# Patient Record
Sex: Male | Born: 1960
Health system: Southern US, Community
[De-identification: ages and names within clinical notes are randomized; demographics above are authoritative.]

## PROBLEM LIST (undated history)

## (undated) DIAGNOSIS — K6389 Other specified diseases of intestine: Secondary | ICD-10-CM

## (undated) DIAGNOSIS — I10 Essential (primary) hypertension: Secondary | ICD-10-CM

## (undated) DIAGNOSIS — D126 Benign neoplasm of colon, unspecified: Secondary | ICD-10-CM

## (undated) DIAGNOSIS — K219 Gastro-esophageal reflux disease without esophagitis: Secondary | ICD-10-CM

## (undated) DIAGNOSIS — L309 Dermatitis, unspecified: Secondary | ICD-10-CM

## (undated) DIAGNOSIS — G4730 Sleep apnea, unspecified: Secondary | ICD-10-CM

## (undated) DIAGNOSIS — C801 Malignant (primary) neoplasm, unspecified: Secondary | ICD-10-CM

## (undated) DIAGNOSIS — E785 Hyperlipidemia, unspecified: Secondary | ICD-10-CM

## (undated) DIAGNOSIS — T7840XA Allergy, unspecified, initial encounter: Secondary | ICD-10-CM

## (undated) HISTORY — PX: COLON SURGERY: SHX602

## (undated) HISTORY — DX: Benign neoplasm of colon, unspecified: D12.6

## (undated) HISTORY — DX: Essential (primary) hypertension: I10

## (undated) HISTORY — DX: Other specified diseases of intestine: K63.89

## (undated) HISTORY — PX: POLYPECTOMY: SHX149

## (undated) HISTORY — DX: Malignant (primary) neoplasm, unspecified: C80.1

## (undated) HISTORY — DX: Hyperlipidemia, unspecified: E78.5

## (undated) HISTORY — DX: Gastro-esophageal reflux disease without esophagitis: K21.9

## (undated) HISTORY — DX: Allergy, unspecified, initial encounter: T78.40XA

---

## 2007-04-25 DIAGNOSIS — J309 Allergic rhinitis, unspecified: Secondary | ICD-10-CM | POA: Insufficient documentation

## 2007-06-10 ENCOUNTER — Emergency Department (HOSPITAL_BASED_OUTPATIENT_CLINIC_OR_DEPARTMENT_OTHER): Admission: EM | Admit: 2007-06-10 | Discharge: 2007-06-10 | Payer: Self-pay | Admitting: Emergency Medicine

## 2009-01-08 ENCOUNTER — Ambulatory Visit (HOSPITAL_BASED_OUTPATIENT_CLINIC_OR_DEPARTMENT_OTHER): Admission: RE | Admit: 2009-01-08 | Discharge: 2009-01-08 | Payer: Self-pay | Admitting: Internal Medicine

## 2009-01-08 ENCOUNTER — Ambulatory Visit: Payer: Self-pay | Admitting: Internal Medicine

## 2009-01-08 ENCOUNTER — Ambulatory Visit: Payer: Self-pay | Admitting: Diagnostic Radiology

## 2009-01-08 DIAGNOSIS — M546 Pain in thoracic spine: Secondary | ICD-10-CM | POA: Insufficient documentation

## 2009-01-08 DIAGNOSIS — K219 Gastro-esophageal reflux disease without esophagitis: Secondary | ICD-10-CM | POA: Insufficient documentation

## 2009-01-08 DIAGNOSIS — J309 Allergic rhinitis, unspecified: Secondary | ICD-10-CM | POA: Insufficient documentation

## 2009-01-15 ENCOUNTER — Ambulatory Visit: Payer: Self-pay | Admitting: Internal Medicine

## 2009-01-15 LAB — CONVERTED CEMR LAB
ALT: 16 units/L (ref 0–53)
AST: 16 units/L (ref 0–37)
Albumin: 4.5 g/dL (ref 3.5–5.2)
Alkaline Phosphatase: 78 units/L (ref 39–117)
BUN: 16 mg/dL (ref 6–23)
Basophils Absolute: 0 10*3/uL (ref 0.0–0.1)
Basophils Relative: 0 % (ref 0–1)
Bilirubin Urine: NEGATIVE
Bilirubin, Direct: 0.1 mg/dL (ref 0.0–0.3)
CO2: 30 meq/L (ref 19–32)
Calcium: 9.4 mg/dL (ref 8.4–10.5)
Chloride: 102 meq/L (ref 96–112)
Cholesterol: 227 mg/dL — ABNORMAL HIGH (ref 0–200)
Creatinine, Ser: 0.94 mg/dL (ref 0.40–1.50)
Eosinophils Absolute: 0.1 10*3/uL (ref 0.0–0.7)
Eosinophils Relative: 1 % (ref 0–5)
Glucose, Bld: 102 mg/dL — ABNORMAL HIGH (ref 70–99)
HCT: 49 % (ref 39.0–52.0)
HDL: 41 mg/dL (ref >39)
Hemoglobin, Urine: NEGATIVE
Hemoglobin: 16.2 g/dL (ref 13.0–17.0)
Indirect Bilirubin: 0.6 mg/dL (ref 0.0–0.9)
Ketones, ur: NEGATIVE mg/dL
LDL Cholesterol: 146 mg/dL — ABNORMAL HIGH (ref 0–99)
Leukocytes, UA: NEGATIVE
Lymphocytes Relative: 27 % (ref 12–46)
Lymphs Abs: 1.5 10*3/uL (ref 0.7–4.0)
MCHC: 33.1 g/dL (ref 30.0–36.0)
MCV: 88 fL (ref 78.0–100.0)
Monocytes Absolute: 0.5 10*3/uL (ref 0.1–1.0)
Monocytes Relative: 9 % (ref 3–12)
Neutro Abs: 3.4 10*3/uL (ref 1.7–7.7)
Neutrophils Relative %: 62 % (ref 43–77)
Nitrite: NEGATIVE
Platelets: 219 10*3/uL (ref 150–400)
Potassium: 4.5 meq/L (ref 3.5–5.3)
Protein, ur: NEGATIVE mg/dL
RBC: 5.57 M/uL (ref 4.22–5.81)
RDW: 13 % (ref 11.5–15.5)
Sodium: 139 meq/L (ref 135–145)
Specific Gravity, Urine: 1.022 (ref 1.005–1.030)
TSH: 2.438 microintl units/mL (ref 0.350–4.500)
Total Bilirubin: 0.7 mg/dL (ref 0.3–1.2)
Total CHOL/HDL Ratio: 5.5
Total Protein: 7 g/dL (ref 6.0–8.3)
Triglycerides: 202 mg/dL — ABNORMAL HIGH (ref <150)
Urine Glucose: NEGATIVE mg/dL
Urobilinogen, UA: 0.2 (ref 0.0–1.0)
VLDL: 40 mg/dL (ref 0–40)
WBC: 5.4 10*3/uL (ref 4.0–10.5)
pH: 6 (ref 5.0–8.0)

## 2009-01-18 ENCOUNTER — Encounter: Payer: Self-pay | Admitting: Internal Medicine

## 2009-01-21 ENCOUNTER — Ambulatory Visit: Payer: Self-pay | Admitting: Internal Medicine

## 2009-06-24 ENCOUNTER — Ambulatory Visit: Payer: Self-pay | Admitting: Internal Medicine

## 2009-06-24 DIAGNOSIS — R0609 Other forms of dyspnea: Secondary | ICD-10-CM | POA: Insufficient documentation

## 2009-06-24 DIAGNOSIS — R0989 Other specified symptoms and signs involving the circulatory and respiratory systems: Secondary | ICD-10-CM

## 2009-07-17 ENCOUNTER — Telehealth: Payer: Self-pay | Admitting: Internal Medicine

## 2009-07-24 ENCOUNTER — Encounter: Payer: Self-pay | Admitting: Internal Medicine

## 2010-01-03 HISTORY — PX: WISDOM TOOTH EXTRACTION: SHX21

## 2010-01-14 ENCOUNTER — Telehealth: Payer: Self-pay | Admitting: Internal Medicine

## 2010-01-19 ENCOUNTER — Encounter: Payer: Self-pay | Admitting: Internal Medicine

## 2010-01-19 LAB — CONVERTED CEMR LAB
BUN: 18 mg/dL (ref 6–23)
CO2: 31 meq/L (ref 19–32)
CRP: 0.1 mg/dL (ref <0.6)
Calcium: 9.2 mg/dL (ref 8.4–10.5)
Chloride: 101 meq/L (ref 96–112)
Cholesterol: 234 mg/dL — ABNORMAL HIGH (ref 0–200)
Creatinine, Ser: 1.02 mg/dL (ref 0.40–1.50)
Glucose, Bld: 103 mg/dL — ABNORMAL HIGH (ref 70–99)
HDL: 39 mg/dL — ABNORMAL LOW (ref >39)
LDL Cholesterol: 174 mg/dL — ABNORMAL HIGH (ref 0–99)
Potassium: 4.5 meq/L (ref 3.5–5.3)
Sodium: 139 meq/L (ref 135–145)
Total CHOL/HDL Ratio: 6
Triglycerides: 106 mg/dL (ref <150)
VLDL: 21 mg/dL (ref 0–40)

## 2010-01-25 ENCOUNTER — Encounter: Payer: Self-pay | Admitting: Internal Medicine

## 2010-01-25 ENCOUNTER — Encounter (INDEPENDENT_AMBULATORY_CARE_PROVIDER_SITE_OTHER): Payer: Self-pay | Admitting: *Deleted

## 2010-01-25 ENCOUNTER — Ambulatory Visit
Admission: RE | Admit: 2010-01-25 | Discharge: 2010-01-25 | Payer: Self-pay | Source: Home / Self Care | Attending: Internal Medicine | Admitting: Internal Medicine

## 2010-01-25 DIAGNOSIS — I10 Essential (primary) hypertension: Secondary | ICD-10-CM | POA: Insufficient documentation

## 2010-01-25 DIAGNOSIS — D239 Other benign neoplasm of skin, unspecified: Secondary | ICD-10-CM | POA: Insufficient documentation

## 2010-01-25 DIAGNOSIS — E785 Hyperlipidemia, unspecified: Secondary | ICD-10-CM | POA: Insufficient documentation

## 2010-01-25 DIAGNOSIS — E7800 Pure hypercholesterolemia, unspecified: Secondary | ICD-10-CM | POA: Insufficient documentation

## 2010-02-02 NOTE — Assessment & Plan Note (Signed)
Summary: cpx/mhf   Vital Signs:  Patient profile:   50 year old male Weight:      198.75 pounds BMI:     29.46 O2 Sat:      98 % on Room air Temp:     98.6 degrees F oral Pulse rate:   70 / minute Pulse rhythm:   regular Resp:     16 per minute BP sitting:   110 / 80  (right arm) Cuff size:   large  Vitals Entered By: Glendell Docker CMA (January 21, 2009 8:44 AM)  O2 Flow:  Room air  Primary Care Provider:  D. Thomos Lemons DO  CC:  CPX.  History of Present Illness: CPX  50 y/o white male for CPX.   right shoulder blade discomfort improved.  symptoms were triggered by movement  lab results reviewed.  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    Physical Exam  General:  alert, well-developed, and well-nourished.   Head:  normocephalic and atraumatic.   Lungs:  normal respiratory effort, normal breath sounds, no crackles, and no wheezes.   Heart:  normal rate, regular rhythm, no murmur, and no gallop.   Abdomen:  soft, non-tender, normal bowel sounds, no masses, no hepatomegaly, and no splenomegaly.   Extremities:  No lower extremity edema  Neurologic:  cranial nerves II-XII intact and gait normal.   Psych:  normally interactive, good eye contact, not anxious appearing, and not depressed appearing.      Impression & Recommendations:  Problem # 1:  HEALTH MAINTENANCE EXAM (ICD-V70.0) Reviewed adult health maintenance protocols.  routine colon ca screening at age 50.  we discussed pros and cons of prostate ca screening.  low saturated fat diet reviewed.  Orders: EKG w/ Interpretation (93000)  Flu Vax: Historical (11/18/2008)   Chol: 227 (01/15/2009)   HDL: 41 (01/15/2009)   LDL: 146 (01/15/2009)   TG: 202 (01/15/2009) TSH: 2.438 (01/15/2009)     Complete Medication List: 1)  Fluticasone Propionate 0.005 % Oint (Fluticasone propionate) .... 2 spray each nostril once daily 2)  Betamethasone Valerate 0.1 % Oint (Betamethasone valerate) .... Apply to affected area two times a day  Patient Instructions: 1)  Please schedule a follow-up appointment in 6 months. 2)  Lipid Panel prior to visit, ICD-9: 272.4 3)  High sensitivity CRP :  272.4 4)  Please return for lab work one (1) week before your next appointment.  Prescriptions: BETAMETHASONE VALERATE 0.1 % OINT (BETAMETHASONE VALERATE) apply to affected area two times a day  #30 grams x 2   Entered and Authorized by:   D. Thomos Lemons DO   Signed by:   D. Thomos Lemons DO on 01/21/2009   Method used:   [facility] [phone]* (retail)       [address]  [place], [place]  [address]       Ph: [phone]       Fax: [phone]   RxID:   [id number]   Current Allergies (reviewed today): No known allergies

## 2010-02-02 NOTE — Assessment & Plan Note (Signed)
Summary: 6 month follow up/mhf   Vital Signs:  Patient profile:   50 year old male Weight:      194.50 pounds BMI:     28.83 O2 Sat:      97 % on Room air Temp:     98.2 degrees F oral Pulse rate:   68 / minute Pulse rhythm:   regular Resp:     18 per minute BP sitting:   110 / 70  (right arm) Cuff size:   large  Vitals Entered By: Glendell Docker CMA (June 24, 2009 8:02 AM)  O2 Flow:  Room air CC: Rm 2- 6 Month Follow up, Abdominal Pain Is Patient Diabetic? No Pain Assessment Patient in pain? no      Comments discuss referrl to urology for Vasectomy, rx for allergy medication,otc claritan takes the edge off, sleep apnea & snoring, medications reviewed, no changes   Primary Care Provider:  DThomos Lemons DO  CC:  Rm 2- 6 Month Follow up and Abdominal Pain.  History of Present Illness:       This is a 50 year old male who presents with allergic rhinitis.  The patient complains of nasal congestion and post nasal drip.  Symptoms improve with antihistamines and steroid nasal spray.  Previous effective treatment includes fexofenadine.  Ineffective treatment includes loratadine.    wife is getting off birth control he requests referral for vasectomy  experienced exacerbation of back pain but co worker "cracked" his back .  symptoms completely resolved  wife notes snoring.  she is concerned he may have OSA mild fatigue, mild decrease in sleep latency some wt gain since prev visit.  he felt better when he got down to 180's joined [facility]  Dyspepsia History:      There is a prior history of GERD.    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   Ears:  R ear normal and L ear normal.   Nose:  nasal dischargemucosal pallor and mucosal edema.   Mouth:  pharynx pink and moist.  no oropharyngeal crowding Neck:  normal size Lungs:  normal respiratory effort, normal breath sounds, and no wheezes.   Heart:  normal rate, regular rhythm, and no gallop.     Impression & Recommendations:  Problem # 1:  ALLERGIC RHINITIS (ICD-477.9) he reports sensitivity to grass pollen.  claritin minimally helpful.  use fexofendaine and flonase as directed.   His updated medication list for this problem includes:    Fluticasone Propionate 50 Mcg/act Susp (Fluticasone propionate) .[name]... 2 sprays each nostril qd    Fexofenadine Hcl 180 Mg Tabs (Fexofenadine hcl) ..... One by mouth once daily  Problem # 2:  CONTRACEPTIVE MANAGEMENT (ICD-V25.09) refer for vasectomy  Dr. Normajean Baxter Orders: Urology Referral (Urology)  Problem # 3:  SNORING (ICD-786.09) wife notes snoring.  some fatigue but net severe.  call dentist for trial of oral appliance.  work on wt loss  if symptoms worsen, consider sleep study  Complete Medication List: 1)  Fluticasone Propionate 50 Mcg/act Susp (Fluticasone propionate) .... 2 sprays each nostril qd 2)  Betamethasone Valerate 0.1 % Oint (Betamethasone valerate) .... Apply to affected area two times a day 3)  Fexofenadine Hcl 180 Mg Tabs (Fexofenadine hcl) .... One by mouth once daily   Patient Instructions: 1)  Please schedule a follow-up appointment in 1 year. 2)  Please schedule a follow-up appointment as needed. Prescriptions: FLUTICASONE PROPIONATE 50 MCG/ACT SUSP (FLUTICASONE PROPIONATE) 2 sprays each nostril qd  #1 x 5   Entered and Authorized by:   D. Thomos Lemons DO   Signed by:   D. Thomos Lemons  DO on 06/24/2009   Method used:   [facility] to        [facility] [phone]* (retail)       [address]       [place], [place]  [address]       Ph: [phone]       Fax: [phone]   RxID:   [id number] FEXOFENADINE HCL 180 MG TABS (FEXOFENADINE HCL) one by mouth once daily  #90 x 3   Entered and Authorized by:   D. Thomos Lemons DO   Signed by:   D. Thomos Lemons DO on 06/24/2009   Method used:   [facility]ronically to        [facility] [phone]* (retail)       [address]       [place], [place]  [address]       Ph: [phone]       Fax: [phone]   RxID:   [id number]   Current Allergies (reviewed today): No known allergies

## 2010-02-02 NOTE — Letter (Signed)
   [place] at [facility] [address] [place], [place]  [address]  [place] Phone: [phone]      January 19, 2009   CAMPBELL KRAY [address] [place], [place] [address]  RE:  LAB RESULTS  Dear  Richard Henderson,  The following is an interpretation of your most recent lab tests.  Please take note of any instructions provided or changes to medications that have resulted from your lab work.  ELECTROLYTES:  Good - no changes needed  KIDNEY FUNCTION TESTS:  Good - no changes needed  LIVER FUNCTION TESTS:  Good - no changes needed  LIPID PANEL:  Fair - review at your next visit Triglyceride: 202   Cholesterol: 227   LDL: 146   HDL: 41   Chol/HDL%:  5.5 Ratio  THYROID STUDIES:  Thyroid studies normal TSH: 2.438     CBC:  Good - no changes needed  Please lower your intake of saturated fats and sweets.  I will further discuss your lab results at your next follow up appointment.        Sincerely Yours,    Dr. Thomos Lemons

## 2010-02-02 NOTE — Consult Note (Signed)
Summary: [facility]  [facility]   Imported By: Maryln Gottron 08/11/2009 13:09:56  _____________________________________________________________________  External Attachment:    Type:   Image     Comment:   External Document

## 2010-02-02 NOTE — Progress Notes (Signed)
Summary: [employer] prior auth  Phone Note Call from Patient Call back at [phone]   Caller: Patient Reason for Call: Referral Summary of Call: Pt requested that we check to see if he needs prior auth with [facility] for his urology referral  Initial call taken by: Lannette Donath,  July 17, 2009 1:12 PM  Follow-up for Phone Call        SW [facility] for procedure code [id number], gave procedure code to [facility], no prior auth needed, pt aware Follow-up by: Lannette Donath,  July 17, 2009 1:38 PM

## 2010-02-02 NOTE — Assessment & Plan Note (Signed)
Summary: new to be est.- jr   Vital Signs:  Patient profile:   50 year old male Height:      69 inches Weight:      197.75 pounds BMI:     29.31 O2 Sat:      99 % on Room air Temp:     98.3 degrees F oral Pulse rate:   66 / minute Pulse rhythm:   regular Resp:     18 per minute BP sitting:   134 / 90  (left arm) Cuff size:   large  Vitals Entered By: Glendell Docker CMA (January 08, 2009 9:17 AM)  O2 Flow:  Room air  Primary Care Provider:  D. Thomos Lemons DO  CC:  New Patient.  History of Present Illness: New Patient   50 y/o male to establish.  He c/o pain for past 3-4 weeks under left shoulder blade.  pain radiates to left  shoulder and  to pectoris  muslce.  his symptoms  aggravated by coughing and sneezing.  pain also worse with certain movements.  no shortness of breath.  no chest pressure.   Preventive Screening-Counseling & Management  Alcohol-Tobacco     Alcohol drinks/day: 0     Alcohol Counseling: not indicated; patient does not drink     Smoking Status: never     Tobacco Counseling: not indicated; no tobacco use  Caffeine-Diet-Exercise     Caffeine use/day: 2 beverage daily     Caffeine Counseling: decrease use of caffeine     Does Patient Exercise: no  Allergies (verified): No Known Drug Allergies  Past History:  Past Medical History: Allergic rhinitis GERD    Past Surgical History: Denies surgical history  Family History: Family History of Arthritis Family History of Prostate CA 1st degree relative  Alzheimers - father died age 65  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    Physical Exam  General:   alert, well-developed, and well-nourished.   Head:  normocephalic and atraumatic.   Eyes:  pupils equal, pupils round, and pupils reactive to light.   Ears:  R ear normal and L ear normal.   Mouth:  Oral mucosa and oropharynx without lesions or exudates.   Neck:  supple and no masses.   Lungs:  normal respiratory effort, normal breath sounds, no crackles, and no wheezes.   Heart:  normal rate, regular rhythm, no murmur, and no gallop.   Abdomen:  soft, non-tender, normal bowel sounds, no masses, no hepatomegaly, and no splenomegaly.   Msk:  pain underneath left shoulder blader with thoracic rotation and sidebending Neurologic:  cranial nerves II-XII intact and gait normal.     Impression & Recommendations:  Problem # 1:  BACK PAIN, THORACIC REGION, LEFT (ICD-724.1) 50 y/o with pain underneath left shoulder blade.  worse with movement.  cxr negative for ptx.  likely MSK etiology.   refer to chiropractor.  Patient advised to call office if symptoms persist or worsen.  Orders: T-2 View CXR, Same Day (71020.5TC)  Patient Instructions: 1)  Please schedule a follow-up appointment in 2 months. 2)  BMP prior to visit, ICD-9: v70 3)  Hepatic Panel prior to visit, ICD-9: v70 4)  Lipid Panel prior to visit, ICD-9: v70 5)  TSH prior to visit, ICD-9: v70 6)  CBC w/ Diff prior to visit, ICD-9: v70 7)  Please return for lab work within 1 week (fasting) 8)  Use ibuprofen 400-600 mg two times a day x 3-5 days as needed 9)  [facility] 10)  [address] 11)  [place], [place] [address] 12)  Phone: [phone])  Fax: [phone] 14)  Email: [email or web address]  Current Allergies (reviewed today): No known allergies     Immunization History:  Influenza Immunization History:    Influenza:  historical (11/18/2008)

## 2010-02-04 NOTE — Letter (Signed)
Summary: Primary Care Consult Scheduled Letter  [place] at [facility]  [address]   [place], [place] [address]   Phone: [phone]  Fax: [phone]      01/25/2010 MRN: [id number]  Richard Henderson [address] [place], [place]  [address]    Dear [name],      We have scheduled an appointment for you.  At the recommendation of Dr._YOO, we have scheduled you a consult with [facility] on _FEBRUARY  14,2012 at 11:30AM.  Their address is_[address] , [place] . The office phone number is [phone].  If this appointment day and time is not convenient for you, please feel free to call the office of the doctor you are being referred to at the number listed above and reschedule the appointment.     It is important for you to keep your scheduled appointments. We are here to make sure you are given good patient care.    Thank you, Darral Dash Patient Care Coordinator [place] at [facility]

## 2010-02-04 NOTE — Progress Notes (Signed)
Summary: Lab order  Phone Note Call from Patient   Caller: Patient Details for Reason: Lab order Summary of Call: Pt scheduled for CPX   January 20  will come in Monday Jan 16 for fasting labs  please order labs Initial call taken by: Darral Dash,  January 14, 2010 2:57 PM  Follow-up for Phone Call        BMP prior to visit, ICD-9:  272.4 Lipid Panel prior to visit, ICD-9:  272.4 High sensitivity CRP :  272.4   Follow-up by: D. Thomos Lemons DO,  January 15, 2010 12:55 PM  Additional Follow-up for Phone Call Additional follow up Details #1::        fasting labs have been entered for [facility]. Additional Follow-up by: Glendell Docker CMA,  January 15, 2010 1:25 PM

## 2010-02-10 ENCOUNTER — Encounter: Payer: Self-pay | Admitting: Internal Medicine

## 2010-02-10 NOTE — Assessment & Plan Note (Signed)
Summary: CPX [place]   Vital Signs:  Patient profile:   50 year old male Height:      69 inches Weight:      198.50 pounds BMI:     29.42 O2 Sat:      100 % on Room air Temp:     98.0 degrees F oral Pulse rate:   56 / minute Resp:     16 per minute BP sitting:   150 / 80  (left arm) Cuff size:   large  Vitals Entered By: Glendell Docker CMA (January 25, 2010 2:37 PM)  O2 Flow:  Room air 8CC: CPX Is Patient Diabetic? No Pain Assessment Patient in pain? no        Primary Care Provider:  Dondra Spry DO  CC:  CPX.  History of Present Illness: 01/14/2010 - went to Dentist for pre visit - BP found to be elevated 172/98, 173/100,  164/92  took cold medication /preps over the holidays 2 cups of day of coffee denies use of "energy drinks"  pt is usually a jogger.  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    Physical Exam  General:  alert, well-developed, and well-nourished.   Head:  normocephalic and atraumatic.   Eyes:  pupils equal, pupils  round, and pupils reactive to light.   Ears:  R ear normal and L ear normal.   Mouth:  pharyngeal crowding.   Neck:  supple, no masses, and no carotid bruits.   Lungs:  normal respiratory effort and normal breath sounds.   Heart:  normal rate, regular rhythm, no murmur, and no gallop.   Abdomen:  soft, non-tender, normal bowel sounds, no masses, no hepatomegaly, and no splenomegaly.   Msk:  multiple nevi Extremities:  No lower extremity edema  Neurologic:  cranial nerves II-XII intact and gait normal.   Psych:  normally interactive, good eye contact, not anxious appearing, and not depressed appearing.     Impression & Recommendations:  Problem # 1:  HEALTH MAINTENANCE EXAM (ICD-V70.0) Reviewed adult health maintenance protocols.  Orders: EKG w/ Interpretation (93000)  Flu Vax: Historical (11/18/2008)   Chol: 234 (01/19/2010)   HDL: 39 (01/19/2010)   LDL: 174 (01/19/2010)   TG: 106 (01/19/2010) TSH: 2.438 (01/15/2009)     Problem # 2:  HYPERTENSION (ICD-401.9) Assessment: New start ARB.  reviewed lifestyle changes  His updated medication list for this problem includes:    Losartan Potassium 50 Mg Tabs (Losartan potassium) ..... One by mouth once daily  BP today: 150/80 Prior BP: 110/70 (06/24/2009)  Labs Reviewed: K+: 4.5 (01/19/2010) Creat: : 1.02 (01/19/2010)   Chol: 234 (01/19/2010)   HDL: 39 (01/19/2010)   LDL: 174 (01/19/2010)   TG: 106 (01/19/2010)  Problem # 3:  HYPERLIPIDEMIA (ICD-272.4) no improvement with dietary changes.  start lipitor  His updated medication list for this problem includes:    Atorvastatin Calcium 10 Mg Tabs (Atorvastatin calcium) ..... One by mouth once daily  Labs Reviewed: SGOT: 16 (01/15/2009)   SGPT: 16 (01/15/2009)   HDL:39 (01/19/2010), 41 (01/15/2009)  LDL:174 (01/19/2010), 146 (01/15/2009)  Chol:234 (01/19/2010), 227 (01/15/2009)  Trig:106 (01/19/2010), 202 (01/15/2009)  Problem # 4:  NEVI, MULTIPLE  (ICD-216.9)  Orders: Dermatology Referral (Derma)  Problem # 5:  SNORING (ICD-786.09)  Orders: Misc. Referral (Misc. Ref)  Complete Medication List: 1)  Fluticasone Propionate 50 Mcg/act Susp (Fluticasone propionate) .... 2 sprays each nostril qd 2)  Betamethasone Valerate 0.1 % Oint (Betamethasone valerate) .... Apply to affected area two times a day 3)  Fexofenadine Hcl 180 Mg Tabs (Fexofenadine hcl) .... One by mouth once daily 4)  Losartan Potassium 50 Mg Tabs (Losartan potassium) .... One by mouth once daily 5)  Atorvastatin Calcium 10 Mg Tabs (Atorvastatin calcium) .... One by mouth once daily  Patient Instructions: 1)  Please schedule a follow-up appointment in 1 month. 2)  BMP prior to visit, ICD-9: 401.9 3)  Hepatic Panel prior to visit, ICD-9: 272.4 4)  Lipid Panel prior to visit, ICD-9: 272.4 5)  Please return for lab work one (1) week before your next appointment.  6)  [email or web address] 7)  Please see attached handout on low cholesterol diet 8)  Our office will contact you re:  dermatology referral Prescriptions: ATORVASTATIN CALCIUM 10 MG TABS (ATORVASTATIN CALCIUM) one by mouth once daily  #30 x 3   Entered and Authorized by:   D. Thomos Lemons DO   Signed by:   D. Thomos Lemons DO on 01/25/2010   Method used:   [facility]        [facility] [phone]* (retail)       [address]       [place], [place]  [address]       Ph: [phone]       Fax: [phone]   RxID:   [id number] LOSARTAN POTASSIUM 50 MG TABS (LOSARTAN POTASSIUM) one by mouth once daily  #30 x 3   Entered and Authorized by:   D. Thomos Lemons DO   Signed by:   D. Thomos Lemons DO on 01/25/2010   Method used:   [facility] to        [facility] [phone]* (retail)       [address]       [place], [place]  [address]       Ph: [phone]       Fax: [phone]   RxID:   [id number]    Orders Added: 1)  EKG w/ Interpretation  [93000] 2)  Dermatology Referral [Derma] 3)  Misc. Referral [Misc. Ref] 4)  Est. Patient 40-64 years [99396] 5)  Est. Patient Level III [35573]     Current Allergies (reviewed today): No known allergies    Appended Document: Orders Update    Clinical Lists Changes  Orders: Added new Referral order of Misc. Referral (Misc. Ref) - Signed

## 2010-02-16 ENCOUNTER — Telehealth: Payer: Self-pay | Admitting: Internal Medicine

## 2010-02-16 ENCOUNTER — Encounter: Payer: Self-pay | Admitting: Internal Medicine

## 2010-02-16 DIAGNOSIS — G4733 Obstructive sleep apnea (adult) (pediatric): Secondary | ICD-10-CM | POA: Insufficient documentation

## 2010-02-18 ENCOUNTER — Encounter (INDEPENDENT_AMBULATORY_CARE_PROVIDER_SITE_OTHER): Payer: Self-pay | Admitting: *Deleted

## 2010-02-24 NOTE — Letter (Signed)
Summary: Primary Care Consult Scheduled Letter  [place] at [facility]  [address]   [place], [place] [address]   Phone: [phone]  Fax: [phone]      02/18/2010 MRN: [id number]  Richard Henderson [address] [place], [place]  [address]    Dear Richard Henderson,      We have scheduled an appointment for you.  At the recommendation of Dr._YOO, we have scheduled you a consult with DR Lanny Cramp [facility] [place] on [facility] 13,2012 at 2:30PM.  Their address is_[address], [address] ,[place]  [address]. The office phone number is [phone].  If this appointment day and time is not convenient for you, please feel free to call the office of the doctor you are being referred to at the number listed above and reschedule the appointment.     It is important for you to keep your scheduled appointments. We are here to make sure you are given good patient care.    Thank you,  Darral Dash Patient Care Coordinator  at [facility]

## 2010-02-24 NOTE — Progress Notes (Signed)
Summary: Sleep Study  Phone Note Outgoing Call   Summary of Call: call pt - sleep screening suggestive of obstructive sleep apnea.   I suggest referral to Dr. Vassie Loll [facility] - 35.28 Initial call taken by: D. Thomos Lemons DO,  February 16, 2010 4:56 PM  Follow-up for Phone Call        call placed to patient at [phone], no answer. A detailed voice message was left for patient to return phone call regard sleep study. Follow-up by: Glendell Docker CMA,  February 17, 2010 8:53 AM  Additional Follow-up for Phone Call Additional follow up Details #1::        Pt has been advised and will await call from [facility] or Marj with appt date/time. Additional Follow-up by: Mervin Kung CMA Duncan Dull),  February 17, 2010 4:12 PM  New Problems: SLEEP APNEA, OBSTRUCTIVE (ICD-327.23)   New Problems: SLEEP APNEA, OBSTRUCTIVE (ICD-327.23)

## 2010-02-26 ENCOUNTER — Encounter: Payer: Self-pay | Admitting: Internal Medicine

## 2010-02-26 LAB — CONVERTED CEMR LAB
ALT: 16 units/L (ref 0–53)
AST: 13 units/L (ref 0–37)
Albumin: 4.4 g/dL (ref 3.5–5.2)
Alkaline Phosphatase: 88 units/L (ref 39–117)
BUN: 15 mg/dL (ref 6–23)
Bilirubin, Direct: 0.1 mg/dL (ref 0.0–0.3)
CO2: 29 meq/L (ref 19–32)
Calcium: 9.4 mg/dL (ref 8.4–10.5)
Chloride: 103 meq/L (ref 96–112)
Cholesterol: 173 mg/dL (ref 0–200)
Creatinine, Ser: 1.03 mg/dL (ref 0.40–1.50)
Glucose, Bld: 107 mg/dL — ABNORMAL HIGH (ref 70–99)
HDL: 37 mg/dL — ABNORMAL LOW (ref >39)
Indirect Bilirubin: 0.7 mg/dL (ref 0.0–0.9)
LDL Cholesterol: 102 mg/dL — ABNORMAL HIGH (ref 0–99)
Potassium: 4.5 meq/L (ref 3.5–5.3)
Sodium: 141 meq/L (ref 135–145)
Total Bilirubin: 0.8 mg/dL (ref 0.3–1.2)
Total CHOL/HDL Ratio: 4.7
Total Protein: 6.9 g/dL (ref 6.0–8.3)
Triglycerides: 170 mg/dL — ABNORMAL HIGH (ref <150)
VLDL: 34 mg/dL (ref 0–40)

## 2010-03-01 ENCOUNTER — Ambulatory Visit (INDEPENDENT_AMBULATORY_CARE_PROVIDER_SITE_OTHER): Payer: 59 | Admitting: Internal Medicine

## 2010-03-01 ENCOUNTER — Encounter: Payer: Self-pay | Admitting: Internal Medicine

## 2010-03-01 DIAGNOSIS — I10 Essential (primary) hypertension: Secondary | ICD-10-CM

## 2010-03-01 DIAGNOSIS — E785 Hyperlipidemia, unspecified: Secondary | ICD-10-CM

## 2010-03-02 NOTE — Miscellaneous (Signed)
Summary: lab orders  Clinical Lists Changes  Orders: Added new Test order of T-Basic Metabolic Panel [id number]) - Signed Added new Test order of T-Hepatic Function [id number]) - Signed Added new Test order of T-Lipid Profile [id number]) - Signed

## 2010-03-02 NOTE — Consult Note (Signed)
Summary: [facility]  [facility]   Imported By: Maryln Gottron 02/25/2010 15:18:29  _____________________________________________________________________  External Attachment:    Type:   Image     Comment:   External Document

## 2010-03-08 ENCOUNTER — Telehealth: Payer: Self-pay | Admitting: Internal Medicine

## 2010-03-09 ENCOUNTER — Encounter: Payer: Self-pay | Admitting: Internal Medicine

## 2010-03-16 ENCOUNTER — Institutional Professional Consult (permissible substitution) (INDEPENDENT_AMBULATORY_CARE_PROVIDER_SITE_OTHER): Payer: 59 | Admitting: Pulmonary Disease

## 2010-03-16 ENCOUNTER — Encounter: Payer: Self-pay | Admitting: Pulmonary Disease

## 2010-03-16 DIAGNOSIS — G4733 Obstructive sleep apnea (adult) (pediatric): Secondary | ICD-10-CM

## 2010-03-16 NOTE — Progress Notes (Signed)
Summary: dentist requests note from dr.Lakhia Gengler  Phone Note Call from Patient Call back at [phone]   Summary of Call: pt called stating that dr. Warrick Parisian) needs a note from dr. Artist Pais stating that pt is on blood pressure pills and that its okay to have his wisdom teeth pulled. please assist. dr. Irving Burton fax [phone]. please assist. Initial call taken by: Elba Barman,  March 08, 2010 10:40 AM  Follow-up for Phone Call        call placed to patient for clarification of message at [phone], He is  stated that he is having 4 wisdom teeth removed on March 15th. His blood pressure was evelated at the dental office prior to seeing Dr Artist Pais, and he was advised by the dentist to obtain a letter stating his blood pressure has been followed up on before he is able to proceed with surgery Follow-up by: Glendell Docker CMA,  March 08, 2010 12:24 PM  Additional Follow-up for Phone Call Additional follow up Details #1::        ok to send letter Additional Follow-up by: D. Thomos Lemons DO,  March 08, 2010 5:46 PM    Additional Follow-up for Phone Call Additional follow up Details #2::    letter printed on 03/09/2010 and faxed to Dr Irving Burton office at [phone] Follow-up by: Glendell Docker CMA,  March 09, 2010 8:47 AM

## 2010-03-16 NOTE — Letter (Signed)
Summary: Generic Letter  [place] at [facility]  [address]   [place], [place] [address]   Phone: [phone]  Fax: [phone]      03/09/2010     [name] [address] [place], [place]  [address]  [place]       To whom it may concern: Richard Henderson is under the care of Dr Thomos Lemons for his blood pressure management. He has been prescribed Losartan  Potassium 50mg  once daily, and is due for follow up with Dr Artist Pais in 6 months.           Sincerely,   Glendell Docker CMA Dr. Thomos Lemons

## 2010-03-23 NOTE — Assessment & Plan Note (Signed)
Summary: 1 MONTH FOLLOW UP/MHF   Vital Signs:  Patient profile:   50 year old male Height:      69 inches Weight:      190.25 pounds BMI:     28.20 O2 Sat:      99 % on Room air Temp:     98.2 degrees F rectal Pulse rate:   58 / minute Resp:     18 per minute BP sitting:   124 / 80  (right arm) Cuff size:   large  Vitals Entered By: Glendell Docker CMA (March 01, 2010 2:24 PM)  O2 Flow:  Room air CC: 1 Month Follow up Is Patient Diabetic? No Pain Assessment Patient in pain? no       Does patient need assistance? Functional Status Self care Comments no concerns, follow up blood work, appt for sleep study on 3/13   Primary Care Provider:  D. Thomos Lemons DO  CC:  1 Month Follow up.  History of Present Illness: 50 y/o white male for follow up BP better since starting losartan he denies side effects  hyperlipidemia - tolerating medication.  denies myalgia.  good dietary compliance  reviewed labs - he has slightly elevated blood sugars  enjoys sweets no soft drinks usually coffee, milk, water  Preventive Screening-Counseling & Management  Alcohol-Tobacco     Smoking Status: never  Allergies (verified): No Known Drug Allergies  Past History:  Past Medical History: Allergic rhinitis GERD        Past Surgical History: Denies surgical history       Family History: Family History of Arthritis Family History of Prostate CA - father  Alzheimers - father died age 56      no colon ca   Social History: Occupation: Airline pilot  Married 21 years (wife Liborio Nixon) 3 daughters 94, 47, 43 Alcohol use- rare  Physical Exam  General:  alert, well-developed, and well-nourished.   Lungs:  normal respiratory effort and normal breath sounds.   Heart:  normal rate, regular rhythm, no murmur, and no gallop.   Neurologic:  cranial nerves II-XII intact and gait normal.     Impression & Recommendations:  Problem # 1:  HYPERLIPIDEMIA  (ICD-272.4) Assessment Improved  His updated medication list for this problem includes:    Atorvastatin Calcium 10 Mg Tabs (Atorvastatin calcium) ..... One by mouth once daily  Labs Reviewed: SGOT: 16 (01/15/2009)   SGPT: 16 (01/15/2009)   HDL:39 (01/19/2010), 41 (01/15/2009)  LDL:174 (01/19/2010), 146 (01/15/2009)  Chol:234 (01/19/2010), 227 (01/15/2009)  Trig:106 (01/19/2010), 202 (01/15/2009)  Problem # 2:  HYPERTENSION (ICD-401.9) Assessment: Improved  His updated medication list for this problem includes:    Losartan Potassium 50 Mg Tabs (Losartan potassium) ..... One by mouth once daily  BP today: 124/80 Prior BP: 150/80 (01/25/2010)  Labs Reviewed: K+: 4.5 (01/19/2010) Creat: : 1.02 (01/19/2010)   Chol: 234 (01/19/2010)   HDL: 39 (01/19/2010)   LDL: 174 (01/19/2010)   TG: 106 (01/19/2010)  Future Orders: T-Basic Metabolic Panel [id number]) ... 08/09/2010  Complete Medication List: 1)  Fluticasone Propionate 50 Mcg/act Susp (Fluticasone propionate) .... 2 sprays each nostril qd 2)  Betamethasone Valerate 0.1 % Oint (Betamethasone valerate) .... Apply to affected area two times a day 3)  Losartan Potassium 50 Mg Tabs (Losartan potassium) .... One by mouth once daily 4)  Atorvastatin Calcium 10 Mg Tabs (Atorvastatin calcium) .... One by mouth once daily 5)  Zyrtec Hives Relief 10 Mg Tabs (Cetirizine hcl) .... Take  1 tablet by mouth once a day  Other Orders: Future Orders: T- Hemoglobin A1C ([id number]) ... 08/09/2010  Patient Instructions: 1)  Avoid products containing refined sugars 2)  Please schedule a follow-up appointment in 6 months. 3)  BMP prior to visit, ICD-9:  401.9 4)  HbgA1C prior to visit, ICD-9:  790.29 5)  Please return for lab work one (1) week before your next appointment.  Prescriptions: ATORVASTATIN CALCIUM 10 MG TABS (ATORVASTATIN CALCIUM) one by mouth once daily  #90 x 1   Entered and Authorized by:   D. Thomos Lemons DO   Signed by:   D. Thomos Lemons DO on 03/01/2010   Method used:   Electronically to        [facility] [phone][facility] (retail)       [address]       [place], [place]  [address]       Ph: [phone]       Fax: [phone]   RxID:   [id number] LOSARTAN POTASSIUM 50 MG TABS (LOSARTAN POTASSIUM) one by mouth once daily  #90 x 1   Entered and Authorized by:   D. Thomos Lemons DO   Signed by:   D. Thomos Lemons DO on 03/01/2010   Method used:   [facility]ronically to        [facility] [phone]* (retail)       [address]       [place], [place]  [address]       Ph: [phone]       Fax: [phone]   RxID:   [id number] FLUTICASONE PROPIONATE 50 MCG/ACT SUSP (FLUTICASONE PROPIONATE) 2 sprays each nostril qd  #1 x 5   Entered and Authorized by:   D. Thomos Lemons DO   Signed by:   D. Thomos Lemons DO on 03/01/2010   Method used:   [facility] to        [facility] [phone]* (retail)       [address]       [place], [place]  [address]       Ph: [phone]       Fax: [phone]   RxID:   [id number]    Orders Added: 1)  T-Basic Metabolic Panel [id number] 2)  T- Hemoglobin A1C [[id number]] 3)  Est. Patient Level III [[address]]    Current Allergies (reviewed today): No known allergies

## 2010-03-23 NOTE — Assessment & Plan Note (Signed)
Summary: CONSULT FOR SLEEP//SH IN [place] OFFICE   Visit Type:  Initial Consult Primary Provider/Referring Provider:  Dondra Spry DO  CC:  Pt c/o snoring.  History of Present Illness: 50/M for evaluation of obstructive sleep apnea  Portable study 'R U asleep'  - 38/h s/o severe obstructive sleep apnea  Wife reports snoring worse on his back Epworth Sleepiness Score 15 Will fall asleep watching Tv on the couch until 1-2 A, afternoons, as a passenger diet coke x 1, coffee am daily Bedtime 11 p, oob 6 A, wakes up tired, non refreshing sleep There is no history suggestive of cataplexy, sleep paralysis or parasomnias   Preventive Screening-Counseling & Management  Alcohol-Tobacco     Alcohol drinks/day: 0     Smoking Status: never   History of Present Illness: Snoring  What time do you typically go to bed?(between what hours): 11pm  How long does it take you to fall asleep? not long  How many times during the night do you wake up? 0  What time do you get out of bed to start your day? 6am  Do you drive or operate heavy machinery in your occupation? no  How much has your weight changed (up or down) over the past two years? (in pounds): 4 or 5 lbs increase  Have you ever had a sleep study before?  If yes,when and where: No  Do you currently use CPAP ? If so , at what pressure? no  Do you wear oxygen at any time? If yes, how many liters per minute? no Current Medications (verified): 1)  Fluticasone Propionate 50 Mcg/act Susp (Fluticasone Propionate) .... 2 Sprays Each Nostril Qd 2)  Betamethasone Valerate 0.1 % Oint (Betamethasone Valerate) .... Apply To Affected Area Two Times A Day 3)  Losartan Potassium 50 Mg Tabs (Losartan Potassium) .... One By Mouth Once Daily 4)  Atorvastatin Calcium 10 Mg Tabs (Atorvastatin Calcium) .... One By Mouth Once Daily 5)  Zyrtec Hives Relief 10 Mg Tabs (Cetirizine Hcl) .... Take 1 Tablet By Mouth Once A Day  Allergies (verified): No  Known Drug Allergies  Past History:  Past Medical History: Last updated: 03/01/2010 Allergic rhinitis GERD        Past Surgical History: Last updated: 03/01/2010 Denies surgical history       Family History: Last updated: 03/01/2010 Family History of Arthritis Family History of Prostate CA - father  Alzheimers - father died age 50      no colon ca   Social History: Last updated: 03/01/2010 Occupation: Airline pilot  Married 21 years (wife Liborio Nixon) 3 daughters 81, 59, 67 Alcohol use- rare  Review of Systems       The patient complains of non-productive cough, irregular heartbeats, acid heartburn, nasal congestion/difficulty breathing through nose, sneezing, itching, ear ache, and joint stiffness or pain.  The patient denies shortness of breath with activity, shortness of breath at rest, productive cough, coughing up blood, chest pain, indigestion, loss of appetite, weight change, abdominal pain, difficulty swallowing, sore throat, tooth/dental problems, headaches, anxiety, depression, hand/feet swelling, rash, change in color of mucus, and fever.    Vital Signs:  Patient profile:   50 year old male Height:      69 inches Weight:      201 pounds BMI:     29.79 O2 Sat:      97 % on Room air Temp:     98.5 degrees F oral Pulse rate:   77 / minute  BP sitting:   104 / 60  (right arm) Cuff size:   large  Vitals Entered By: Zackery Barefoot CMA (March 16, 2010 2:43 PM)  O2 Flow:  Room air CC: Pt c/o snoring Comments Medications reviewed with patient Verified contact number and pharmacy with patient Zackery Barefoot CMA  March 16, 2010 2:43 PM    Physical Exam  Additional Exam:  Gen. Pleasant, well-nourished, in no distress, normal affect ENT - no lesions, no post nasal drip, class 2 airway, long uvula, deviated septum Neck: No JVD, no thyromegaly, no carotid bruits Lungs: no use of accessory muscles, no dullness to percussion, clear without rales or  rhonchi  Cardiovascular: Rhythm regular, heart sounds  normal, no murmurs or gallops, no peripheral edema Abdomen: soft and non-tender, no hepatosplenomegaly, BS normal. Musculoskeletal: No deformities, no cyanosis or clubbing Neuro:  alert, non focal     Impression & Recommendations:  Problem # 1:  SLEEP APNEA, OBSTRUCTIVE (ICD-327.23) The pathophysiology of obstructive sleep apnea, it's cardiovascular consequences and modes of treatment including CPAP were discussed with the patient in great detail.  High pre-test probability given symptoms, narrow pharyngeal exam & positive screenign study. Not clear on positional component or sleep stage association - hence will need in lab study , perform as split with CPAP intervention as needed. Orders: Consultation Level III (62130) Sleep Study (Sleep Study)  Patient Instructions: 1)  Copy sent to: dr Artist Pais 2)  Please schedule a follow-up appointment in 2 weeks after sleep study

## 2010-04-01 ENCOUNTER — Ambulatory Visit (HOSPITAL_BASED_OUTPATIENT_CLINIC_OR_DEPARTMENT_OTHER): Payer: 59 | Attending: Pulmonary Disease

## 2010-04-01 DIAGNOSIS — G4733 Obstructive sleep apnea (adult) (pediatric): Secondary | ICD-10-CM | POA: Insufficient documentation

## 2010-04-12 ENCOUNTER — Telehealth: Payer: Self-pay | Admitting: Pulmonary Disease

## 2010-04-12 ENCOUNTER — Encounter: Payer: Self-pay | Admitting: Pulmonary Disease

## 2010-04-12 DIAGNOSIS — G4733 Obstructive sleep apnea (adult) (pediatric): Secondary | ICD-10-CM

## 2010-04-12 NOTE — Telephone Encounter (Signed)
Pt scheduled for follow up 04/13/10, will discuss at OV

## 2010-04-12 NOTE — Telephone Encounter (Signed)
Pl let him know that PSG showed moderate obstructive sleep apnea  Where he stopped breathing about 25 times per hour If he is willing, can proceed with order for CPAP. If agreeable pl order CPAP medium comfort gel mask 12 cm, humidity, download in 4 weeks

## 2010-04-13 ENCOUNTER — Ambulatory Visit (INDEPENDENT_AMBULATORY_CARE_PROVIDER_SITE_OTHER): Payer: 59 | Admitting: Pulmonary Disease

## 2010-04-13 ENCOUNTER — Encounter: Payer: Self-pay | Admitting: Pulmonary Disease

## 2010-04-13 VITALS — BP 102/70 | HR 59 | Temp 98.8°F | Ht 69.0 in | Wt 197.0 lb

## 2010-04-13 DIAGNOSIS — G4733 Obstructive sleep apnea (adult) (pediatric): Secondary | ICD-10-CM

## 2010-04-13 NOTE — Progress Notes (Signed)
  Subjective:    Patient ID: Richard Henderson, male    DOB: 02/23/1960, 50 y.o.   MRN: [id number]  HPI  49/M for evaluation of obstructive sleep apnea  Portable study 'R U asleep'  - 38/h s/o severe obstructive sleep apnea  Wife reports snoring worse on his back Epworth Sleepiness Score 15 Will fall asleep watching Tv on the couch until 1-2 A, afternoons, as a passenger diet coke x 1, coffee am daily Bedtime 11 p, oob 6 A, wakes up tired, non refreshing sleep  04/13/2010 PSG showed moderate obstructive sleep apnea Where he stopped breathing about 25 times per hour  Was able to tolerate cpap well during the study CPAP medium comfort gel mask 12 cm, humidity, download in 4 weeks      Review of Systems  Constitutional: Negative for fever and appetite change.  HENT: Negative for nosebleeds, congestion and sneezing.   Respiratory: Negative for cough, shortness of breath and wheezing.   Cardiovascular: Negative for chest pain and palpitations.  Gastrointestinal: Negative for abdominal pain.  Genitourinary: Negative for dysuria.  Musculoskeletal: Negative for joint swelling.  Skin: Negative for rash.       Objective:   Physical Exam Gen. Pleasant, well-nourished, in no distress ENT - no lesions, no post nasal drip Neck: No JVD, no thyromegaly, no carotid bruits Lungs: no use of accessory muscles, no dullness to percussion, clear without rales or rhonchi  Cardiovascular: Rhythm regular, heart sounds  normal, no murmurs or gallops, no peripheral edema Musculoskeletal: No deformities, no cyanosis or clubbing         Assessment & Plan:

## 2010-04-13 NOTE — Patient Instructions (Signed)
We will get you started on CPAP Pl turn in the card before your next appt in 1 month

## 2010-04-14 NOTE — Assessment & Plan Note (Signed)
PSG confirms obstructive sleep apnea - moderate degree, close to that shown by the portable study. Will initiate cpap 12 cm with humidity Download in 1 month, discussed common issues & alternatives including oral appliance compliance with goal of at least 4-6 hrs every night is the expectation. Advised against medications with sedative side effects Cautioned against driving when sleepy - understanding that sleepiness will vary on a day to day basis

## 2010-04-16 ENCOUNTER — Telehealth: Payer: Self-pay | Admitting: Pulmonary Disease

## 2010-04-16 NOTE — Telephone Encounter (Signed)
Advised pt the paperwork was faxed over to [facility] this morning and someone from [facility] should be contacting him w/in the next to set him up on cpap. Pt verbalized understandning

## 2010-05-03 NOTE — Procedures (Signed)
Richard Henderson, Richard Henderson             ACCOUNT NO.:  [id number]  MEDICAL RECORD NO.:  [id number]          PATIENT TYPE:  OUT  LOCATION:  SLEEP CENTER                 FACILITY:  [facility]  PHYSICIAN:  Oretha Milch, MD      DATE OF BIRTH:  1960-07-27  DATE OF STUDY:                           NOCTURNAL POLYSOMNOGRAM  REFERRING PHYSICIAN:  Jazion Atteberry V. Zehava Turski  INDICATION FOR STUDY:  Richard Henderson is a 50 year old gentleman with loud snoring, excessive daytime somnolence, and a positive screening study. At the time of this study, he weighed 190 pounds with a height of 69 inches, BMI of 28, neck size 16 inches.  EPWORTH SLEEPINESS SCORE:  12.  MEDICATIONS:  Zyrtec, losartan, atorvastatin.  This nocturnal polysomnogram was performed with intervention. Polysomnogram was performed with a sleep technologist in attendance. EEG, EOG, EMG, EKG, and respiratory parameters were recorded.  Sleep stages arousals, limb movements, and respiratory data were scored according to criteria laid out by the American Academy of Sleep Medicine.  SLEEP ARCHITECTURE:  Lights-out was at 4:15 p.m., lights-on was at 5:18 a.m.  CPAP was initiated at 1:32 a.m.  During the diagnostic portion, total sleep time was 126 minutes with a sleep period time of 128 minutes and a sleep efficiency of 92%.  Sleep latency was 9 minutes.  Latency to REM sleep was 66 minutes.  Wake after sleep onset was 2 minutes.  Sleep stages of the percent of total sleep time was N1 14%, N2 85%, N3 1%, and REM sleep 10% (12.5 minutes).  During the titration portion, REM sleep accounted for 35 minutes and supine sleep accounted for 158 minutes. Longest period of REM sleep was around 3 a.m.  RESPIRATORY DATA:  During the diagnostic portion, he had 16 obstructive apneas, 4 central apneas, 0 mixed apneas, and 33 hypopneas with an apnea- hypopnea index of 25 events per hour.  The lowest desaturation of 79%. Due to this degree of respiratory disturbance, CPAP  was initiated at 4 cm and titrated to a final level of 13 cm.  At a level of 9 cm for 35 minutes including 25 minutes of REM sleep, 7 obstructive apneas were noted with an AHI of 12 events per hour and lowest desaturation of 90%. Central apneas seem to emerge at the higher levels of CPAP at a final level of 13 cm for 14.5 minutes and no events of desaturations were noted at a level of 12 cm for 86 minutes including 8 minutes of REM sleep, 4 central apneas, and 3 hypopneas were noted with an AHI of 5 events per hour.  This appears to be the optimal level used during the study.  AROUSAL DATA:  During the diagnostic portion, the arousal index was 8 events per hour.  During the titration portion, the arousal index of 70 events per hour, overall CPAP was well tolerated.  LIMB MOVEMENT DATA:  No significant limb movements were noted.  OXYGEN DATA:  Oxygen saturation data, the lowest desaturation during the diagnostic portion was 79%.  Lowest desaturation during the titration portion was 85%.  He spent 0.4 minutes with a saturation less than 88% during the titration portion.  CARDIAC DATA:  The low  heart rate was 40 beats per minute, the high heart rate recorded was an artifact.  No arrhythmias were noted.  DISCUSSION:  Central apneas seemed to emerge at CPAP levels with 10 and 12.  He was desensitized with a medium ComfortGel full-face mask, some oral venting was noted during the study.  He reported some residual pain in the area of jaw dislocation.  MOVEMENT-PARASOMNIA:  none noted  IMPRESSIONS-RECOMMENDATIONS: 1. Moderate obstructive sleep apnea with hypopneas causing sleep     fragmentation and oxygen desaturation. 2. This was corrected by continuous positive airway pressure of 12 cm     with a medium full-face mask. 3. No evidence of cardiac arrhythmias, limb movements, or behavioral     disturbance during sleep.  RECOMMENDATIONS: 1. The treatment option for this degree of  sleep disordered breathing     include weight loss, CPAP therapy, alternatively oral appliances     can also be considered. 2. CPAP should be initiated with a goal of 12 cm with a medium full-     face mask and compliance should be monitored at this level. 3. He should be cautioned against driving when sleepy.  He should be     advised against medications with sedative side effects.     Oretha Milch, MD Electronically Signed    RVA/MEDQ  D:  04/12/2010 12:30:44  T:  04/13/2010 02:25:11  Job:  [id number]  cc:   Barbette Hair. [place], DO [address] [place], [place] [address]

## 2010-05-07 ENCOUNTER — Encounter: Payer: Self-pay | Admitting: Pulmonary Disease

## 2010-05-11 ENCOUNTER — Telehealth: Payer: Self-pay | Admitting: Pulmonary Disease

## 2010-05-11 ENCOUNTER — Ambulatory Visit: Payer: 59 | Admitting: Pulmonary Disease

## 2010-05-11 NOTE — Telephone Encounter (Signed)
Lmomtcb. What type of cpap does pt have? We can only download if Resmed.

## 2010-05-12 NOTE — Telephone Encounter (Signed)
Spoke w/ pt and he states he is not sure what type of machine he has and will look when he gets home. i advised pt we can get a download off his machine if it is a resmed. Pt verbalized understanding and will look. Nothing further was needed

## 2010-05-25 ENCOUNTER — Encounter: Payer: Self-pay | Admitting: Pulmonary Disease

## 2010-05-25 ENCOUNTER — Ambulatory Visit (INDEPENDENT_AMBULATORY_CARE_PROVIDER_SITE_OTHER): Payer: 59 | Admitting: Pulmonary Disease

## 2010-05-25 VITALS — BP 100/66 | HR 61 | Temp 98.7°F | Ht 69.0 in | Wt 199.0 lb

## 2010-05-25 DIAGNOSIS — G4733 Obstructive sleep apnea (adult) (pediatric): Secondary | ICD-10-CM

## 2010-05-25 NOTE — Progress Notes (Signed)
  Subjective:    Patient ID: Richard Henderson, male    DOB: Jun 06, 1960, 50 y.o.   MRN: [id number]  HPI 49/M for evaluation of obstructive sleep apnea  Portable study 'R U asleep' - 38/h s/o severe obstructive sleep apnea  Wife reports snoring worse on his back  Epworth Sleepiness Score 15  Will fall asleep watching Tv on the couch until 1-2 A, afternoons, as a passenger  diet coke x 1, coffee am daily  Bedtime 11 p, oob 6 A, wakes up tired, non refreshing sleep   04/13/2010  PSG showed moderate obstructive sleep apnea Where he stopped breathing about 25 times per hour  Was able to tolerate cpap well during the study  CPAP medium comfort gel mask 12 cm, humidity, download in 4 weeks  05/25/2010 Download 4/18-5/21/12 on 12 cm shows good compliance > 6h, residual AHi of 11/h He feels better, more awake, rested Occasional snoring noted by wife        Review of Systems Pt denies any significant  nasal congestion or excess secretions, fever, chills, sweats, unintended wt loss, pleuritic or exertional cp, orthopnea pnd or leg swelling.  Pt also denies any obvious fluctuation in symptoms with weather or environmental change or other alleviating or aggravating factors.    Pt denies any increase in rescue therapy over baseline, denies waking up needing it or having early am exacerbations or coughing/wheezing/ or dyspnea       Objective:   Physical Exam Gen. Pleasant, well-nourished, in no distress ENT - no lesions, no post nasal drip Neck: No JVD, no thyromegaly, no carotid bruits Lungs: no use of accessory muscles, no dullness to percussion, clear without rales or rhonchi  Cardiovascular: Rhythm regular, heart sounds  normal, no murmurs or gallops, no peripheral edema Musculoskeletal: No deformities, no cyanosis or clubbing          Assessment & Plan:

## 2010-05-25 NOTE — Assessment & Plan Note (Signed)
Change to autoCPAP 12- 15 for residual events & chk download to make sure that central events do not worsen with increased pressure - if so, this may indicate complex sleep apnea. Weight loss encouraged, compliance with goal of at least 4-6 hrs every night is the expectation. Advised against medications with sedative side effects Cautioned against driving when sleepy - understanding that sleepiness will vary on a day to day basis

## 2010-05-25 NOTE — Patient Instructions (Signed)
We will adjust pressure & recheck download in 1 month. Call me with feedback

## 2010-06-02 ENCOUNTER — Encounter: Payer: Self-pay | Admitting: Pulmonary Disease

## 2010-06-21 ENCOUNTER — Encounter: Payer: Self-pay | Admitting: Internal Medicine

## 2010-06-21 ENCOUNTER — Telehealth: Payer: Self-pay | Admitting: *Deleted

## 2010-06-21 NOTE — Telephone Encounter (Signed)
Patient called and left voice message stating he sprained his ankle about 9 days ago and he is still having swelling and pain in his foot, and wanted to know  If he should be seen.  Call was returned to patient at [phone], he was advised that he should be seen for evaluation. His call was transferred to front desk to schedule office visit.

## 2010-06-22 ENCOUNTER — Ambulatory Visit (HOSPITAL_BASED_OUTPATIENT_CLINIC_OR_DEPARTMENT_OTHER)
Admission: RE | Admit: 2010-06-22 | Discharge: 2010-06-22 | Disposition: A | Discharge status: Home / Self Care | Payer: 59 | Source: Ambulatory Visit | Attending: Family | Admitting: Family

## 2010-06-22 ENCOUNTER — Encounter: Payer: Self-pay | Admitting: Family

## 2010-06-22 ENCOUNTER — Ambulatory Visit (INDEPENDENT_AMBULATORY_CARE_PROVIDER_SITE_OTHER): Payer: 59 | Admitting: Family

## 2010-06-22 ENCOUNTER — Other Ambulatory Visit: Payer: Self-pay | Admitting: Family

## 2010-06-22 VITALS — BP 110/76 | HR 60 | Temp 97.9°F | Resp 16 | Ht 69.02 in | Wt 201.0 lb

## 2010-06-22 DIAGNOSIS — M25579 Pain in unspecified ankle and joints of unspecified foot: Secondary | ICD-10-CM

## 2010-06-22 DIAGNOSIS — M25572 Pain in left ankle and joints of left foot: Secondary | ICD-10-CM

## 2010-06-22 DIAGNOSIS — M25473 Effusion, unspecified ankle: Secondary | ICD-10-CM

## 2010-06-22 DIAGNOSIS — M25476 Effusion, unspecified foot: Secondary | ICD-10-CM

## 2010-06-22 DIAGNOSIS — R609 Edema, unspecified: Secondary | ICD-10-CM | POA: Insufficient documentation

## 2010-06-22 DIAGNOSIS — M7989 Other specified soft tissue disorders: Secondary | ICD-10-CM

## 2010-06-22 DIAGNOSIS — M25472 Effusion, left ankle: Secondary | ICD-10-CM

## 2010-06-22 NOTE — Assessment & Plan Note (Signed)
Left ankle swelling, suspect sprain.  Will x-ray to be sure that he does not have a fracture.  Recommended ACE and PRN motrin sparingly.

## 2010-06-22 NOTE — Progress Notes (Signed)
  Subjective:    Patient ID: Richard Henderson, male    DOB: 03-01-1960, 50 y.o.   MRN: [id number]  HPI  Richard Henderson is a 50 yr old male who presents today with chief complaint of left foot pain. Pain started 2 weeks ago. Reports that he was doing yard work and trying to dig up root balls with a shovel- leaning on the left foot.  Pain is worst with pressure.  Was limping a lot the first few days, now just has mild discomfort and swelling.    Review of Systems See HPI  Past Medical History  Diagnosis Date  . Allergic rhinitis   . GERD (gastroesophageal reflux disease)     History   Social History  . Marital Status: Married    Spouse Name: N/A    Number of Children: 3  . Years of Education: N/A   Occupational History  . Application Systems Analyst    Social History Main Topics  . Smoking status: Never Smoker   . Smokeless tobacco: Never Used  . Alcohol Use: Yes     Rare  . Drug Use: Not on file  . Sexually Active: Not on file   Other Topics Concern  . Not on file   Social History Narrative  . No narrative on file    No past surgical history on file.  Family History  Problem Relation Age of Onset  . Prostate cancer Father   . Alzheimer's disease Father   . Cancer Father     prostate  . Arthritis Other     No Known Allergies  Current Outpatient Prescriptions on File Prior to Visit  Medication Sig Dispense Refill  . atorvastatin (LIPITOR) 10 MG tablet Take 10 mg by mouth daily.        . betamethasone valerate (VALISONE) 0.1 % cream Apply 1 application topically 2 (two) times daily as needed.       . cetirizine (ZYRTEC) 10 MG tablet Take 10 mg by mouth daily.        . fluticasone (FLONASE) 50 MCG/ACT nasal spray 2 sprays by Nasal route Once daily as needed.      [name] losartan (COZAAR) 50 MG tablet Take 50 mg by mouth daily.          BP 110/76  Pulse 60  Temp(Src) 97.9 F (36.6 C) (Oral)  Resp 16  Ht 5' 9.02" (1.753 m)  Wt 201 lb (91.173 kg)  BMI 29.67  kg/m2       Objective:   Physical Exam  Constitutional: He appears well-developed and well-nourished. No distress.  Musculoskeletal:       + swelling of left medial malleolus without tenderness or erythema. Normal ROM of left ankle/foot  Skin: Skin is warm and dry.  Psychiatric: He has a normal mood and affect. His behavior is normal. Judgment and thought content normal.          Assessment & Plan:

## 2010-06-22 NOTE — Patient Instructions (Signed)
Please complete your x-ray on the first floor.  Call if your swelling worsens or does not continue to improve.

## 2010-07-09 ENCOUNTER — Encounter: Payer: Self-pay | Admitting: Pulmonary Disease

## 2010-08-25 ENCOUNTER — Telehealth: Payer: Self-pay | Admitting: Pulmonary Disease

## 2010-08-25 DIAGNOSIS — G4733 Obstructive sleep apnea (adult) (pediatric): Secondary | ICD-10-CM

## 2010-08-25 NOTE — Telephone Encounter (Signed)
Download on auto 12-15 cm >> good compliance, avg pr 14 cm, few residual events (10/h) acceptable No changes to pressure

## 2010-08-26 NOTE — Telephone Encounter (Signed)
lmomtcb x1 

## 2010-08-31 ENCOUNTER — Encounter: Payer: Self-pay | Admitting: Pulmonary Disease

## 2010-08-31 NOTE — Telephone Encounter (Signed)
Spoke with the tpt and advised of RA recs. The pt states since he was placed on this last setting that he has began to snore again and his wife tells him he is making a lot of noises at night.  Pt states this did not happen on previous setting. Please advise.Carron Curie, CMA

## 2010-09-08 NOTE — Telephone Encounter (Signed)
Pl send order to change pr to 15 cm & download in 4 wks

## 2010-09-13 ENCOUNTER — Telehealth: Payer: Self-pay | Admitting: Internal Medicine

## 2010-09-13 DIAGNOSIS — D239 Other benign neoplasm of skin, unspecified: Secondary | ICD-10-CM

## 2010-09-13 NOTE — Telephone Encounter (Signed)
WANTS A REFERRAL TO DERMOTOLOGY AND WANTS TO SEE SAME DOCTOR AS WIFE SAW AT [facility]

## 2010-09-13 NOTE — Telephone Encounter (Signed)
Call placed to patient at [phone], recording reached x 3 stating the number could not be completed as dialed. Call placed to patient at [phone], no answer. A voice message was left for patient to return call regarding referral appointment.   Clarification is needed on Dermatology request.

## 2010-09-14 NOTE — Telephone Encounter (Signed)
Order placed to [facility]. 

## 2010-09-14 NOTE — Telephone Encounter (Signed)
Call placed to patient  [phone],  He is requesting a referral to have a mole looked at. He stated the mole has changed. So he is wanting to know if he could get referral to a dermatologist with [facility].

## 2010-09-15 NOTE — Assessment & Plan Note (Signed)
Pt called stating mole has changed and requests referral for derm eval.  See orders.

## 2010-09-15 NOTE — Telephone Encounter (Signed)
Call placed to patient at [phone], he was informed referral order for dermatology has been placed and he would be contacted within the week regarding the appointment date and time.

## 2010-12-20 ENCOUNTER — Telehealth: Payer: Self-pay | Admitting: Internal Medicine

## 2010-12-20 MED ORDER — ATORVASTATIN CALCIUM 10 MG PO TABS: 10.0000 mg | ORAL_TABLET | Freq: Every day | ORAL | Status: DC

## 2010-12-20 MED ORDER — LOSARTAN POTASSIUM 50 MG PO TABS: 50.0000 mg | ORAL_TABLET | Freq: Every day | ORAL | Status: DC

## 2010-12-20 NOTE — Telephone Encounter (Signed)
rx sent in electronically 

## 2010-12-20 NOTE — Telephone Encounter (Signed)
Refill- atorvastatin 10mg  tablet. Take one tablet by mouth once daily. Qty 90 last fill 11.11.12  Refill- losartan potassium 50mg  tab. Take one tablet by mouth once daily. Qty 90 last fill 11.11.12

## 2010-12-23 ENCOUNTER — Ambulatory Visit (INDEPENDENT_AMBULATORY_CARE_PROVIDER_SITE_OTHER): Payer: 59 | Admitting: Internal Medicine

## 2010-12-23 ENCOUNTER — Encounter: Payer: Self-pay | Admitting: Internal Medicine

## 2010-12-23 VITALS — BP 120/72 | HR 100 | Temp 99.4°F | Resp 18 | Wt 206.0 lb

## 2010-12-23 DIAGNOSIS — J029 Acute pharyngitis, unspecified: Secondary | ICD-10-CM

## 2010-12-23 LAB — POCT RAPID STREP A (OFFICE): Rapid Strep A Screen: NEGATIVE

## 2010-12-23 MED ORDER — AZITHROMYCIN 250 MG PO TABS: ORAL_TABLET | ORAL | Status: AC

## 2010-12-26 DIAGNOSIS — J029 Acute pharyngitis, unspecified: Secondary | ICD-10-CM | POA: Insufficient documentation

## 2010-12-26 NOTE — Progress Notes (Signed)
  Subjective:    Patient ID: Richard Henderson, male    DOB: [birth year], 49 y.o.   MRN: [id number]  HPI Pt presents to clinic for evaluation of ST. Notes one week h/o fever Tmax 101.8, myalgias, chills and ST. No sick exposures. No alleviating or exacerbating factors. No other complaints.  Past Medical History  Diagnosis Date  . Allergic rhinitis   . GERD (gastroesophageal reflux disease)    No past surgical history on file.  reports that he has never smoked. He has never used smokeless tobacco. He reports that he drinks alcohol. His drug history not on file. family history includes Alzheimer's disease in his father; Arthritis in his other; Cancer in his father; and Prostate cancer in his father. No Known Allergies   Review of Systems see hpi     Objective:   Physical Exam  Constitutional: He appears well-developed and well-nourished. No distress.  HENT:  Head: Normocephalic and atraumatic.  Right Ear: Tympanic membrane, external ear and ear canal normal.  Left Ear: Tympanic membrane, external ear and ear canal normal.  Nose: Nose normal.  Mouth/Throat: Oropharynx is clear and moist and mucous membranes are normal. No oropharyngeal exudate, posterior oropharyngeal edema, posterior oropharyngeal erythema or tonsillar abscesses.  Eyes: Conjunctivae are normal. Right eye exhibits no discharge. Left eye exhibits no discharge. No scleral icterus.  Neck: Neck supple.  Lymphadenopathy:    He has no cervical adenopathy.  Neurological: He is alert.  Skin: Skin is warm. No rash noted. He is not diaphoretic.  Psychiatric: He has a normal mood and affect.          Assessment & Plan:

## 2010-12-26 NOTE — Assessment & Plan Note (Signed)
Rapid strep neg. Given zpak to hold. Begin abx if sx's do not improve after total duration of 8-10 days. Followup if no improvement or worsening.

## 2011-01-04 DIAGNOSIS — C801 Malignant (primary) neoplasm, unspecified: Secondary | ICD-10-CM

## 2011-01-04 HISTORY — PX: COLONOSCOPY: SHX174

## 2011-01-04 HISTORY — DX: Malignant (primary) neoplasm, unspecified: C80.1

## 2011-02-16 ENCOUNTER — Telehealth: Payer: Self-pay | Admitting: Internal Medicine

## 2011-02-16 MED ORDER — ATORVASTATIN CALCIUM 10 MG PO TABS: 10.0000 mg | ORAL_TABLET | Freq: Every day | ORAL | Status: DC

## 2011-02-16 MED ORDER — LOSARTAN POTASSIUM 50 MG PO TABS: 50.0000 mg | ORAL_TABLET | Freq: Every day | ORAL | Status: DC

## 2011-02-16 NOTE — Telephone Encounter (Signed)
Call placed to patient [phone], he was informed office due. He has scheduled with Dr Rodena Medin for Thursday 02/24/2011 @ 8:30 am.

## 2011-02-16 NOTE — Telephone Encounter (Signed)
Refill-atorvastatin 10mg  tablet. Take one tablet (10mg  total) by mouth daily. Qty 30 last fill 12.17.12

## 2011-02-16 NOTE — Telephone Encounter (Signed)
Refill- losartan potassium 50mg  tab. Take one tablet (50mg  total) by mouth daily. Qty 30 last fill 12.17.12

## 2011-02-24 ENCOUNTER — Ambulatory Visit (INDEPENDENT_AMBULATORY_CARE_PROVIDER_SITE_OTHER): Payer: 59 | Admitting: Internal Medicine

## 2011-02-24 ENCOUNTER — Other Ambulatory Visit: Payer: Self-pay | Admitting: Internal Medicine

## 2011-02-24 ENCOUNTER — Encounter: Payer: Self-pay | Admitting: Internal Medicine

## 2011-02-24 VITALS — BP 106/70 | HR 59 | Temp 98.3°F | Resp 16 | Ht 69.0 in | Wt 205.0 lb

## 2011-02-24 DIAGNOSIS — Z79899 Other long term (current) drug therapy: Secondary | ICD-10-CM

## 2011-02-24 DIAGNOSIS — E785 Hyperlipidemia, unspecified: Secondary | ICD-10-CM

## 2011-02-24 DIAGNOSIS — M25473 Effusion, unspecified ankle: Secondary | ICD-10-CM

## 2011-02-24 DIAGNOSIS — Z1211 Encounter for screening for malignant neoplasm of colon: Secondary | ICD-10-CM | POA: Insufficient documentation

## 2011-02-24 DIAGNOSIS — M25472 Effusion, left ankle: Secondary | ICD-10-CM

## 2011-02-24 DIAGNOSIS — I10 Essential (primary) hypertension: Secondary | ICD-10-CM

## 2011-02-24 LAB — HEPATIC FUNCTION PANEL
ALT: 17 U/L (ref 0–53)
AST: 15 U/L (ref 0–37)
Albumin: 4.4 g/dL (ref 3.5–5.2)
Alkaline Phosphatase: 77 U/L (ref 39–117)
Bilirubin, Direct: 0.1 mg/dL (ref 0.0–0.3)
Indirect Bilirubin: 0.3 mg/dL (ref 0.0–0.9)
Total Bilirubin: 0.4 mg/dL (ref 0.3–1.2)
Total Protein: 6.6 g/dL (ref 6.0–8.3)

## 2011-02-24 LAB — LIPID PANEL
Cholesterol: 160 mg/dL (ref 0–200)
HDL: 34 mg/dL — ABNORMAL LOW (ref >39)
LDL Cholesterol: 107 mg/dL — ABNORMAL HIGH (ref 0–99)
Total CHOL/HDL Ratio: 4.7 Ratio
Triglycerides: 96 mg/dL (ref <150)
VLDL: 19 mg/dL (ref 0–40)

## 2011-02-24 LAB — BASIC METABOLIC PANEL
BUN: 19 mg/dL (ref 6–23)
CO2: 29 mEq/L (ref 19–32)
Calcium: 9.5 mg/dL (ref 8.4–10.5)
Chloride: 104 mEq/L (ref 96–112)
Creat: 0.93 mg/dL (ref 0.50–1.35)
Glucose, Bld: 109 mg/dL — ABNORMAL HIGH (ref 70–99)
Potassium: 5.1 mEq/L (ref 3.5–5.3)
Sodium: 141 mEq/L (ref 135–145)

## 2011-02-24 MED ORDER — LOSARTAN POTASSIUM 50 MG PO TABS: 50.0000 mg | ORAL_TABLET | Freq: Every day | ORAL | Status: DC

## 2011-02-24 MED ORDER — BETAMETHASONE VALERATE 0.1 % EX CREA: 1.0000 "application " | TOPICAL_CREAM | Freq: Two times a day (BID) | CUTANEOUS | Status: DC | PRN

## 2011-02-24 MED ORDER — ATORVASTATIN CALCIUM 10 MG PO TABS: 10.0000 mg | ORAL_TABLET | Freq: Every day | ORAL | Status: DC

## 2011-02-24 NOTE — Patient Instructions (Signed)
Please schedule fasting labs prior to your next appointment: cbc-401.9, chem7-v58.69 and lipid/lft 272.4 You will be contacted regarding setting up a colonoscopy.  If your ankle continues to be a problem consider seeing Dr. Norton Blizzard. Call and we can refer you if needed.

## 2011-02-24 NOTE — Assessment & Plan Note (Signed)
Normotensive and stable. Continue current regimen. Monitor bp as outpt and followup in clinic as scheduled. Obtain chem7 

## 2011-02-24 NOTE — Assessment & Plan Note (Signed)
Obtain lipid/lft. 

## 2011-02-24 NOTE — Assessment & Plan Note (Signed)
Suspect ligamental etiology. Recommend pretreatment with otc nsaid. If sx's worsen consider sports medicine consult

## 2011-02-24 NOTE — Assessment & Plan Note (Signed)
Refer for screening colonoscopy 

## 2011-02-24 NOTE — Progress Notes (Signed)
  Subjective:    Patient ID: Richard Henderson, male    DOB: 24-Oct-1960, 51 y.o.   MRN: [id number]  HPI Pt presents to clinic for followup of multiple medical problems. BP reviewed normotensive. Tolerates statin tx without myalgias or abn lft. Notes intermittent left medial ankle swelling and pain after exercise. Past ?ligamental injury. Sx's may be recently improving. Not taking medication for the problem.  Past Medical History  Diagnosis Date  . Allergic rhinitis   . GERD (gastroesophageal reflux disease)    No past surgical history on file.  reports that he has never smoked. He has never used smokeless tobacco. He reports that he drinks alcohol. His drug history not on file. family history includes Alzheimer's disease in his father; Arthritis in his other; Cancer in his father; and Prostate cancer in his father. No Known Allergies    Review of Systems see hpi     Objective:   Physical Exam  Physical Exam  Nursing note and vitals reviewed. Constitutional: Appears well-developed and well-nourished. No distress.  HENT:  Head: Normocephalic and atraumatic.  Right Ear: External ear normal.  Left Ear: External ear normal.  Eyes: Conjunctivae are normal. No scleral icterus.  Neck: Neck supple. Carotid bruit is not present.  Cardiovascular: Normal rate, regular rhythm and normal heart sounds.  Exam reveals no gallop and no friction rub.   No murmur heard. Pulmonary/Chest: Effort normal and breath sounds normal. No respiratory distress. He has no wheezes. no rales.  Lymphadenopathy:    He has no cervical adenopathy.  Neurological:Alert.  Skin: Skin is warm and dry. Not diaphoretic.  Psychiatric: Has a normal mood and affect.  MSK: left ankle- no erythema, warmth or effusion. NT. FROM. Gait nl       Assessment & Plan:

## 2011-03-10 ENCOUNTER — Telehealth: Payer: Self-pay | Admitting: *Deleted

## 2011-03-10 DIAGNOSIS — Z79899 Other long term (current) drug therapy: Secondary | ICD-10-CM

## 2011-03-10 DIAGNOSIS — I10 Essential (primary) hypertension: Secondary | ICD-10-CM

## 2011-03-10 DIAGNOSIS — R7309 Other abnormal glucose: Secondary | ICD-10-CM

## 2011-03-10 DIAGNOSIS — E785 Hyperlipidemia, unspecified: Secondary | ICD-10-CM

## 2011-03-10 NOTE — Telephone Encounter (Signed)
Future lab orders entered for the week of 08/17/11 and copies forwarded to the lab.

## 2011-03-30 ENCOUNTER — Telehealth: Payer: Self-pay | Admitting: Internal Medicine

## 2011-03-30 MED ORDER — FLUTICASONE PROPIONATE 50 MCG/ACT NA SUSP: 2.0000 | Freq: Every day | NASAL | Status: DC

## 2011-03-30 NOTE — Telephone Encounter (Signed)
Fluticasone  Nasal spray [facility] [address]

## 2011-03-30 NOTE — Telephone Encounter (Signed)
Rx refill sent to pharmacy. 

## 2011-04-13 ENCOUNTER — Encounter: Payer: Self-pay | Admitting: Internal Medicine

## 2011-04-13 ENCOUNTER — Ambulatory Visit (AMBULATORY_SURGERY_CENTER): Payer: 59 | Admitting: *Deleted

## 2011-04-13 VITALS — Ht 69.0 in | Wt 207.4 lb

## 2011-04-13 DIAGNOSIS — Z1211 Encounter for screening for malignant neoplasm of colon: Secondary | ICD-10-CM

## 2011-04-13 MED ORDER — MOVIPREP 100 G PO SOLR: ORAL | Status: DC

## 2011-04-27 ENCOUNTER — Telehealth: Payer: Self-pay | Admitting: *Deleted

## 2011-04-27 ENCOUNTER — Ambulatory Visit (AMBULATORY_SURGERY_CENTER): Payer: 59 | Admitting: Internal Medicine

## 2011-04-27 ENCOUNTER — Other Ambulatory Visit: Payer: Self-pay | Admitting: Internal Medicine

## 2011-04-27 ENCOUNTER — Encounter: Payer: Self-pay | Admitting: Internal Medicine

## 2011-04-27 VITALS — BP 132/78 | HR 62 | Temp 97.7°F | Resp 16 | Ht 69.0 in | Wt 207.0 lb

## 2011-04-27 DIAGNOSIS — K6289 Other specified diseases of anus and rectum: Secondary | ICD-10-CM

## 2011-04-27 DIAGNOSIS — Z1211 Encounter for screening for malignant neoplasm of colon: Secondary | ICD-10-CM

## 2011-04-27 DIAGNOSIS — D129 Benign neoplasm of anus and anal canal: Secondary | ICD-10-CM

## 2011-04-27 DIAGNOSIS — D126 Benign neoplasm of colon, unspecified: Secondary | ICD-10-CM

## 2011-04-27 DIAGNOSIS — R198 Other specified symptoms and signs involving the digestive system and abdomen: Secondary | ICD-10-CM

## 2011-04-27 DIAGNOSIS — D128 Benign neoplasm of rectum: Secondary | ICD-10-CM

## 2011-04-27 MED ORDER — SODIUM CHLORIDE 0.9 % IV SOLN: 500.0000 mL | INTRAVENOUS | Status: DC

## 2011-04-27 NOTE — Telephone Encounter (Signed)
Left for [place] at [facility] to call with a referral for pt; rectal mass found on COLON this am.

## 2011-04-27 NOTE — Telephone Encounter (Signed)
Message copied by Florene Glen on Wed Apr 27, 2011 11:34 AM ------      Message from: Marnette Burgess      Created: Wed Apr 27, 2011 10:29 AM       Patient scheduled to see Dr. Chevis Pretty on 05/18/11 @ 3:00pm, arrive @ 2:30pm. If you have any questions plese call [phone].            Thank You,      Elane Fritz      ----- Message -----         From: Linna Hoff, RN         Sent: 04/27/2011  10:03 AM           To: Marnette Burgess            [place], need an appt for above pt for rectal mass found on COLONOSCOPY this am; path rushed, but cancer suspected.      No preference in surgeons that I know of. Thanks.

## 2011-04-27 NOTE — Telephone Encounter (Signed)
Informed pt of his appt with [name] on 05/18/11 at 3pm; mailed the appt info with a map to pt. Pt stated understanding.

## 2011-04-27 NOTE — Patient Instructions (Addendum)

## 2011-04-27 NOTE — Progress Notes (Signed)
Patient did not have preoperative order for IV antibiotic SSI prophylaxis. (G8918)  Patient did not experience any of the following events: a burn prior to discharge; a fall within the facility; wrong site/side/patient/procedure/implant event; or a hospital transfer or hospital admission upon discharge from the facility. (G8907)  

## 2011-04-27 NOTE — Op Note (Signed)
[facility] [address] [place], [place]  [address]  COLONOSCOPY PROCEDURE REPORT  PATIENT:  Richard, Henderson  MR#:  [id number] BIRTHDATE:  November 22, 1960, 51 yrs. old  GENDER:  male ENDOSCOPIST:  Carie Caddy. Munachimso Rigdon, MD REF. BY:  Charlynn Court, M.D. PROCEDURE DATE:  04/27/2011 PROCEDURE:  Colonoscopy with snare polypectomy, Colonoscopy with biopsy ASA CLASS:  Class II INDICATIONS:  Routine Risk Screening, 1st colonoscopy MEDICATIONS:   MAC sedation, administered by CRNA, propofol (Diprivan) 300 mg IV  DESCRIPTION OF PROCEDURE:   After the risks benefits and alternatives of the procedure were thoroughly explained, informed consent was obtained.  Digital rectal exam was performed and revealed rectal nodule.   The LB [id number] endoscope was introduced through the anus and advanced to the terminal ileum which was intubated for a short distance, without limitations. The quality of the prep was good, using MoviPrep.  The instrument was then slowly withdrawn as the colon was fully examined. <<PROCEDUREIMAGES>>  FINDINGS:  The terminal ileum appeared normal.  A 2 mm sessile polyp was found in the ascending colon. Polyp was snared without cautery. Retrieval was successful. A 4 mm sessile polyp was found in the descending colon. Polyp was snared without cautery. Retrieval was successful. A 2 mm semi-pedunculated polyp was found in the rectum. Polyp was snared without cautery. Retrieval was successful.  A 3 cm laterally spreading tubulovillous mass was found in the rectum.  This was located 1-2 cm from the dentate line.  Multiple biopsies were obtained and sent to pathology. Retroflexed views in the rectum revealed the above mentioned rectal mass.   The scope was then withdrawn from the cecum and the procedure completed.  COMPLICATIONS:  None ENDOSCOPIC IMPRESSION: 1) Normal terminal ileum 2) Sessile polyp in the ascending colon. Removed and sent to pathology. 3) Sessile  polyp in the descending colon. Removed and sent to pathology. 4) Sessile polyp in the rectum. Removed and sent to pathology. 5) Laterally spreading tubulovillous polyp/mass in the rectum. Multiple biopsies performed and sent to pathology rush.  RECOMMENDATIONS: 1) Hold aspirin, aspirin products, and anti-inflammatory medication for 1 week. 2) Await pathology results 3) Surgical referral for rectal polyp/mass resection. 4) Repeat colonoscopy 1 year after resection.  Carie Caddy. Rhea Belton, MD  CC:  Charlynn Court MD The Patient  n. eSIGNEDCarie Caddy. Ilai Hiller at 04/27/2011 09:41 AM  Bertram Savin, [phone]

## 2011-04-28 ENCOUNTER — Telehealth: Payer: Self-pay

## 2011-04-28 ENCOUNTER — Telehealth: Payer: Self-pay | Admitting: *Deleted

## 2011-04-28 NOTE — Telephone Encounter (Signed)
PLEASE CALL PT ON HIS CELL IF NEEDED:  [phone]

## 2011-04-28 NOTE — Telephone Encounter (Signed)
  Follow up Call-  Call back number 04/27/2011  Post procedure Call Back phone  # cell [phone]  Permission to leave phone message Yes     Patient questions:  Do you have a fever, pain , or abdominal swelling? no Pain Score  0 *  Have you tolerated food without any problems? yes  Have you been able to return to your normal activities? yes  Do you have any questions about your discharge instructions: Diet   no Medications  no Follow up visit  no  Do you have questions or concerns about your Care? no  Actions: * If pain score is 4 or above: No action needed, pain <4.   Per the pt " no problems".  The pt asked we call [phone] to get in touch with him regarding his pathology.  Left a message for Aram Beecham on this. [place]

## 2011-05-02 ENCOUNTER — Encounter: Payer: Self-pay | Admitting: Internal Medicine

## 2011-05-18 ENCOUNTER — Encounter (INDEPENDENT_AMBULATORY_CARE_PROVIDER_SITE_OTHER): Payer: Self-pay | Admitting: General Surgery

## 2011-05-18 ENCOUNTER — Ambulatory Visit (INDEPENDENT_AMBULATORY_CARE_PROVIDER_SITE_OTHER): Payer: 59 | Admitting: General Surgery

## 2011-05-18 VITALS — BP 130/80 | HR 64 | Temp 98.2°F | Resp 14 | Ht 69.0 in | Wt 207.8 lb

## 2011-05-18 DIAGNOSIS — D128 Benign neoplasm of rectum: Secondary | ICD-10-CM

## 2011-05-18 DIAGNOSIS — D129 Benign neoplasm of anus and anal canal: Secondary | ICD-10-CM

## 2011-05-18 NOTE — Patient Instructions (Signed)
Will have you return next week to see Dr. Michaell Cowing

## 2011-05-18 NOTE — Progress Notes (Signed)
Subjective:     Patient ID: Richard Henderson, male   DOB: 1960/11/16, 51 y.o.   MRN: [id number]  HPI We are asked to see the patient in consultation by Dr. Rhea Belton to evaluate him for a mass in the rectum. The patient is a 51 year old white male who recently went for his first screening colonoscopy. At that time he was having no real bowel symptoms. His appetite has been good and his bowels and then working normally. He is only on the occasion noticed any blood in the stool. He denies any rectal pain or tenesmus.  Review of Systems  Constitutional: Negative.   HENT: Negative.   Eyes: Negative.   Respiratory: Negative.   Cardiovascular: Negative.   Gastrointestinal: Negative.   Genitourinary: Negative.   Musculoskeletal: Negative.   Skin: Negative.   Neurological: Negative.   Hematological: Negative.   Psychiatric/Behavioral: Negative.        Objective:   Physical Exam  Constitutional: He is oriented to person, place, and time. He appears well-developed and well-nourished.  HENT:  Head: Normocephalic and atraumatic.  Eyes: Conjunctivae and EOM are normal. Pupils are equal, round, and reactive to light.  Neck: Normal range of motion. Neck supple.  Cardiovascular: Normal rate, regular rhythm and normal heart sounds.   Pulmonary/Chest: Effort normal and breath sounds normal.  Abdominal: Soft. Bowel sounds are normal.  Genitourinary: Rectum normal.       On digital exam I am not sure I can really feel the adenoma in his rectal vault. He would not allow an anoscopic exam secondary to discomfort  Musculoskeletal: Normal range of motion.  Neurological: He is alert and oriented to person, place, and time.  Skin: Skin is warm and dry.  Psychiatric: He has a normal mood and affect. His behavior is normal.       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 I am not sure I could feel it on exam today. He would not  tolerate an anoscopic exam.    Plan:     At this point I think he will need an exam under anesthesia and if the lesion is low enough we can do a transanal excision of it. If the lesion is higher he may need a TEM-type procedure that is done by Dr. gross in this practice. I have discussed this case with Dr. gross in he will plan to see the patient next week and then together we will plan to taken to the operating room to address the adenoma in the rectum. I've discussed with him in detail the risks and benefits of the operation as well as some of the technical aspects and he understands and wishes to proceed

## 2011-05-25 ENCOUNTER — Ambulatory Visit (INDEPENDENT_AMBULATORY_CARE_PROVIDER_SITE_OTHER): Payer: 59 | Admitting: Surgery

## 2011-05-25 ENCOUNTER — Encounter (INDEPENDENT_AMBULATORY_CARE_PROVIDER_SITE_OTHER): Payer: Self-pay | Admitting: Surgery

## 2011-05-25 VITALS — BP 116/78 | HR 70 | Temp 97.4°F | Resp 14 | Ht 69.0 in | Wt 206.0 lb

## 2011-05-25 DIAGNOSIS — D128 Benign neoplasm of rectum: Secondary | ICD-10-CM

## 2011-05-25 NOTE — Progress Notes (Signed)
Subjective:     Patient ID: Richard Henderson, male   DOB: 11-04-1960, 51 y.o.   MRN: [id number]  HPI  [name]  01/11/1960 [id number]  Patient Care Team: Edwyna Perfect, MD as PCP - General (Internal Medicine) Beverley Fiedler, MD as Consulting Physician (Gastroenterology)  This patient is a 51 y.o.male who presents today for surgical evaluation at the request of Dr. Carolynne Edouard.   Reason for visit: Probable rectal polyp. Question need for TEM partial proctectomy.  Patient is a pleasant healthy male who was found to have numerous polyps by colonoscopy on screening. He does confess he's had a few episodes of blood with his bowel movements for the past few months before his colonoscopy.  During colonoscopy, Dr. Sharla Kidney found and removed numerous polyps.  However, he saw a 3 cm rectal polyp too large to safely remove in the distal rectum. Biopsy showed adenomatous changes. He was sent for consideration of excision.   My partner, Dr. Carolynne Edouard, cannot definitely feel a lesion. He was worried it may be more proximal & require TEM partial proctectomy. The patient wished to discuss that option with me so Dr. Carolynne Edouard referred the patient to me.  Otherwise very active. No prior bowel surgeries.  BM daily  Patient Active Problem List  Diagnoses  . ALLERGIC RHINITIS  . GERD  . BACK PAIN, THORACIC REGION, LEFT  . NEVI, MULTIPLE  . HYPERLIPIDEMIA  . HYPERTENSION  . SLEEP APNEA, OBSTRUCTIVE  . Left ankle swelling  . Colon cancer screening  . Tubulovillous adenoma of rectum    Past Medical History  Diagnosis Date  . Allergic rhinitis   . GERD (gastroesophageal reflux disease)   . Hyperlipidemia   . Hypertension   . Colonic mass     Past Surgical History  Procedure Date  . Wisdom tooth extraction 2012    History   Social History  . Marital Status: Married    Spouse Name: N/A    Number of Children: 3  . Years of Education: N/A   Occupational History  . Application Systems Analyst     Social History Main Topics  . Smoking status: Never Smoker   . Smokeless tobacco: Never Used  . Alcohol Use: Yes     Rare  . Drug Use: No  . Sexually Active: Not on file   Other Topics Concern  . Not on file   Social History Narrative  . No narrative on file    Family History  Problem Relation Age of Onset  . Prostate cancer Father   . Alzheimer's disease Father   . Cancer Father     prostate  . Arthritis Other     Current Outpatient Prescriptions  Medication Sig Dispense Refill  . atorvastatin (LIPITOR) 10 MG tablet Take 1 tablet (10 mg total) by mouth daily. Office visit needed for refills  90 tablet  1  . betamethasone valerate (VALISONE) 0.1 % cream Apply 1 application topically 2 (two) times daily as needed.  30 g  1  . cetirizine (ZYRTEC) 10 MG tablet Take 10 mg by mouth daily.        . fluticasone (FLONASE) 50 MCG/ACT nasal spray Place 2 sprays into the nose daily.  16 g  3  . losartan (COZAAR) 50 MG tablet Take 1 tablet (50 mg total) by mouth daily. Office visit needed for refills  90 tablet  1  . MOVIPREP 100 G SOLR movi prep as directed  1 each  0  No Known Allergies  BP 116/78  Pulse 70  Temp(Src) 97.4 F (36.3 C) (Temporal)  Resp 14  Ht 5\' 9"  (1.753 m)  Wt 206 lb (93.441 kg)  BMI 30.42 kg/m2  No results found.   Review of Systems  Constitutional: Negative for fever, chills and diaphoresis.  HENT: Negative for nosebleeds, sore throat, facial swelling, mouth sores, trouble swallowing and ear discharge.   Eyes: Negative for photophobia, discharge and visual disturbance.  Respiratory: Negative for choking, chest tightness, shortness of breath and stridor.   Cardiovascular: Negative for chest pain and palpitations.       Walks 3 miles day  Gastrointestinal: Positive for anal bleeding. Negative for nausea, vomiting, abdominal pain, diarrhea, constipation, blood in stool, abdominal distention and rectal pain.  Genitourinary: Negative for dysuria,  urgency, difficulty urinating and testicular pain.  Musculoskeletal: Negative for myalgias, back pain, arthralgias and gait problem.  Skin: Negative for color change, pallor, rash and wound.  Neurological: Negative for dizziness, speech difficulty, weakness, numbness and headaches.  Hematological: Negative for adenopathy. Does not bruise/bleed easily.  Psychiatric/Behavioral: Negative for hallucinations, confusion and agitation.       Objective:   Physical Exam  Constitutional: He is oriented to person, place, and time. He appears well-developed and well-nourished. No distress.  HENT:  Head: Normocephalic.  Mouth/Throat: Oropharynx is clear and moist. No oropharyngeal exudate.  Eyes: Conjunctivae and EOM are normal. Pupils are equal, round, and reactive to light. No scleral icterus.  Neck: Normal range of motion. Neck supple. No tracheal deviation present.  Cardiovascular: Normal rate, regular rhythm and intact distal pulses.   Pulmonary/Chest: Effort normal and breath sounds normal. No respiratory distress.  Abdominal: Soft. He exhibits no distension. There is no tenderness. There is no guarding. Hernia confirmed negative in the right inguinal area and confirmed negative in the left inguinal area.       Small umb hernia ~ 3mm  Genitourinary:       Exam done with assistance of male Medical Assistant in the room.  Perianal skin clean with good hygiene.  No pruritis.  No external skin tags / hemorrhoids of significance.  No pilonidal disease.  No fissure.  No abscess/fistula.   Tolerates digital and anoscopic rectal exam.  Normal sphincter tone.  Hemorrhoidal piles WNL  Soft mobile large rectal mass - Left lateral to anterior, 1/3 circumference.  ~7cm from anal verge.  Bleeds easily   Musculoskeletal: Normal range of motion. He exhibits no tenderness.  Lymphadenopathy:    He has no cervical adenopathy.       Right: No inguinal adenopathy present.       Left: No inguinal adenopathy  present.  Neurological: He is alert and oriented to person, place, and time. No cranial nerve deficit. He exhibits normal muscle tone. Coordination normal.  Skin: Skin is warm and dry. No rash noted. He is not diaphoretic. No erythema. No pallor.  Psychiatric: He has a normal mood and affect. His behavior is normal. Judgment and thought content normal.       Assessment:     Rectal polyp, needs for removal    Plan:     I think he has a classic location where Partial proctectomy for definitive excisional biopsy by TEM would be of benefit. Plan left side down decubitus since it primarily left lateral going to the anterior region. Hopefully just an overnight stay. I did discuss procedure with him in detail:  The anatomy & physiology of the digestive tract was discussed.  The pathophysiology of the rectal pathology was discussed.  Natural history risks without surgery was discussed.   I feel the risks of no intervention will lead to serious problems that outweigh the operative risks; therefore, I recommended surgery.    Laparoscopic & open abdominal techniques were discussed.  I recommended we start with a partial proctectomy by transanal endoscopic microsurgery (TEM) for excisional biopsy to remove the pathology and hopefully cure and/or control the pathology.  This technique can offer less operative risk and faster post-operative recovery.  Possible need for immediate or later abdominal surgery for further treatment was discussed.   Risks such as bleeding, abscess, reoperation, ostomy, heart attack, death, and other risks were discussed.   I noted a good likelihood this will help address the problem.  Goals of post-operative recovery were discussed as well.  We will work to minimize complications.  An educational handout was given as well.  Questions were answered.  The patient expresses understanding & wishes to proceed with surgery.

## 2011-05-25 NOTE — Patient Instructions (Signed)
  o TRANSANAL ENDOSCOPIC MICROSURGERY o  o Anatomy of the rectum.   o  o The rectum is the lower part of the colon that resides in the pelvis.  It is the final location of stool before it is evacuated through the anus in the process of defecation.  The rectum is an area where unfortunately polyps or a cancer can develop.  In instances of most pre-cancerous lesions, a person often does not need to have a large resection of the rectum, but have it excised by an endoscope with loop and snares.  Unfortunately some polyps are too large to be safely excised through endoscopy and require surgery.  Classically, this is done through an open incision through a low anterior resection or abdominoperineal resection where part or the entire rectum is removed.  However, sometimes only part of a wall of the rectum needs to be removed.  o Transanal endoscopic microsurgery (TEM) was developed as a means to provide a good regional resection of part of the rectal wall for a pre-cancerous lesion or in resection of cancers in which the patient cannot tolerate an open surgery or has an extremely hostile abdomen that makes the resection very risky.   o Transanal endoscopic microsurgery (TEM) involves the patient to be placed under complete general anesthesia. The patient is usually positioned on their back in stirrups or sometimes on their bottom.  The anus is gently dilated and a metal tube is placed into the rectum.   o  o Through the tube, air is inflated and long instruments are used to help access and cut out the abnormal polyp or tumor.  The long instruments are also used to help sew the hole shut.  The specimen is then sent for pathology.  The procedure itself usually takes a few hours of time.  The patient usually stays overnight.  When they can tolerate a regular diet and have adequate pain control they usually leave in one or two days.   o The advantage of TEM is that as opposed to a long hospital stay, patient recovery  is much faster and are less likely to have bowel or other problems.  Careful pre-operative selection is essential to make sure that the patient is an appropriate candidate for the surgery.  Tumors or cancers that are very large or invasive usually are much more difficult to remove by this technique and are not considered the first option.  Persons who are most appropriate for this surgery are those with large polyps that have not become cancers or early cancers in patients who have high risks with larger surgery.    o  o Risks to the surgery are inherent but overall the procedure is less stressful and less risky to the patient than a classic partial colon resection.   o  

## 2011-06-21 ENCOUNTER — Telehealth (INDEPENDENT_AMBULATORY_CARE_PROVIDER_SITE_OTHER): Payer: Self-pay

## 2011-06-21 NOTE — Telephone Encounter (Signed)
[facility] faxed over requesting orders to filled out by Dr Michaell Cowing on his surgical pt. I sent Dr Michaell Cowing a staff message asking to complete the orders in epic.

## 2011-06-22 ENCOUNTER — Encounter (HOSPITAL_COMMUNITY): Payer: Self-pay | Admitting: Pharmacy Technician

## 2011-06-22 ENCOUNTER — Telehealth (INDEPENDENT_AMBULATORY_CARE_PROVIDER_SITE_OTHER): Payer: Self-pay

## 2011-06-22 ENCOUNTER — Other Ambulatory Visit (INDEPENDENT_AMBULATORY_CARE_PROVIDER_SITE_OTHER): Payer: Self-pay | Admitting: Surgery

## 2011-06-22 NOTE — Telephone Encounter (Signed)
Per Darlene at [facility] Need surgery orders put in epic please.

## 2011-06-22 NOTE — Telephone Encounter (Signed)
ORDERS DONE AGAIN  P.S. I STILL H8 EPIC

## 2011-06-23 ENCOUNTER — Encounter (HOSPITAL_COMMUNITY): Payer: Self-pay

## 2011-06-23 ENCOUNTER — Encounter (HOSPITAL_COMMUNITY)
Admission: RE | Admit: 2011-06-23 | Discharge: 2011-06-23 | Disposition: A | Discharge status: Home / Self Care | Payer: 59 | Source: Ambulatory Visit | Attending: Surgery | Admitting: Surgery

## 2011-06-23 ENCOUNTER — Ambulatory Visit (HOSPITAL_COMMUNITY)
Admission: RE | Admit: 2011-06-23 | Discharge: 2011-06-23 | Disposition: A | Discharge status: Home / Self Care | Payer: 59 | Source: Ambulatory Visit | Attending: Surgery | Admitting: Surgery

## 2011-06-23 DIAGNOSIS — K620 Anal polyp: Secondary | ICD-10-CM | POA: Insufficient documentation

## 2011-06-23 DIAGNOSIS — Z01812 Encounter for preprocedural laboratory examination: Secondary | ICD-10-CM | POA: Insufficient documentation

## 2011-06-23 DIAGNOSIS — Z01818 Encounter for other preprocedural examination: Secondary | ICD-10-CM | POA: Insufficient documentation

## 2011-06-23 DIAGNOSIS — K621 Rectal polyp: Secondary | ICD-10-CM | POA: Insufficient documentation

## 2011-06-23 HISTORY — DX: Dermatitis, unspecified: L30.9

## 2011-06-23 HISTORY — DX: Sleep apnea, unspecified: G47.30

## 2011-06-23 LAB — CBC
HCT: 46.9 % (ref 39.0–52.0)
Hemoglobin: 16.1 g/dL (ref 13.0–17.0)
MCH: 30.1 pg (ref 26.0–34.0)
MCHC: 34.3 g/dL (ref 30.0–36.0)
MCV: 87.7 fL (ref 78.0–100.0)
Platelets: 198 10*3/uL (ref 150–400)
RBC: 5.35 MIL/uL (ref 4.22–5.81)
RDW: 12.4 % (ref 11.5–15.5)
WBC: 6.2 10*3/uL (ref 4.0–10.5)

## 2011-06-23 LAB — BASIC METABOLIC PANEL
BUN: 15 mg/dL (ref 6–23)
CO2: 31 mEq/L (ref 19–32)
Calcium: 9.7 mg/dL (ref 8.4–10.5)
Chloride: 101 mEq/L (ref 96–112)
Creatinine, Ser: 0.92 mg/dL (ref 0.50–1.35)
GFR calc Af Amer: >90 mL/min (ref >90)
GFR calc non Af Amer: >90 mL/min (ref >90)
Glucose, Bld: 97 mg/dL (ref 70–99)
Potassium: 4.2 mEq/L (ref 3.5–5.1)
Sodium: 139 mEq/L (ref 135–145)

## 2011-06-23 LAB — SURGICAL PCR SCREEN
MRSA, PCR: NEGATIVE
Staphylococcus aureus: POSITIVE — AB

## 2011-06-23 MED ORDER — CHLORHEXIDINE GLUCONATE 4 % EX LIQD: 1.0000 "application " | Freq: Once | CUTANEOUS | Status: DC

## 2011-06-23 NOTE — Patient Instructions (Addendum)
20 Richard Henderson  06/23/2011   Your procedure is scheduled on:  06/30/11 AT 1:30 PM  Report to SHORT STAY DEPT  at 11:00 AM.  Call this number if you have problems the morning of surgery: [phone]   Remember:   Do not eat food or drink liquids AFTER MIDNIGHT  May have clear liquids UNTIL 6 HOURS BEFORE SURGERY ( 7:30 AM)  Clear liquids include soda, tea, black coffee, apple or grape juice, broth.  Take these medicines the morning of surgery with A SIP OF WATER: LIPITOR / ZYRTEC IF NEEDED   Do not wear jewelry, make-up or nail polish.  Do not wear lotions, powders, or perfumes.   Do not shave legs or underarms 48 hrs. before surgery (men may shave face)  Do not bring valuables to the hospital.  Contacts, dentures or bridgework may not be worn into surgery.  Leave suitcase in the car. After surgery it may be brought to your room.  For patients admitted to the hospital, checkout time is 11:00 AM the day of discharge.   Patients discharged the day of surgery will not be allowed to drive home.    Special Instructions:   Please read over the following fact sheets that you were given: MRSA  Information               SHOWER WITH BETASEPT THE NIGHT BEFORE SURGERY AND THE MORNING OF SURGERY                USE FLEET ENEMA THE MORNING OF SURGERY BEFORE COMING TO HOSPITAL              STOP ALL ASPIRIN AND HERBAL MEDS AND IBUPROFEN / ALEVE / MOTRIN 7 DAYS BEFORE SURGERY              BRING C-PAP MASK TO HOSPITAL

## 2011-06-30 ENCOUNTER — Encounter (HOSPITAL_COMMUNITY)
Admission: RE | Disposition: A | Discharge status: Home / Self Care | Payer: Self-pay | Source: Ambulatory Visit | Attending: Surgery

## 2011-06-30 ENCOUNTER — Ambulatory Visit (HOSPITAL_COMMUNITY)
Admission: RE | Admit: 2011-06-30 | Discharge: 2011-07-01 | Disposition: A | Discharge status: Home / Self Care | Payer: 59 | Source: Ambulatory Visit | Attending: Surgery | Admitting: Surgery

## 2011-06-30 ENCOUNTER — Encounter (HOSPITAL_COMMUNITY): Payer: Self-pay | Admitting: Anesthesiology

## 2011-06-30 ENCOUNTER — Encounter (HOSPITAL_COMMUNITY): Payer: Self-pay | Admitting: *Deleted

## 2011-06-30 ENCOUNTER — Ambulatory Visit (HOSPITAL_COMMUNITY): Payer: 59 | Admitting: Anesthesiology

## 2011-06-30 DIAGNOSIS — D128 Benign neoplasm of rectum: Secondary | ICD-10-CM | POA: Insufficient documentation

## 2011-06-30 DIAGNOSIS — K429 Umbilical hernia without obstruction or gangrene: Secondary | ICD-10-CM | POA: Insufficient documentation

## 2011-06-30 DIAGNOSIS — K6282 Dysplasia of anus: Secondary | ICD-10-CM | POA: Insufficient documentation

## 2011-06-30 DIAGNOSIS — E785 Hyperlipidemia, unspecified: Secondary | ICD-10-CM | POA: Insufficient documentation

## 2011-06-30 DIAGNOSIS — K219 Gastro-esophageal reflux disease without esophagitis: Secondary | ICD-10-CM | POA: Insufficient documentation

## 2011-06-30 DIAGNOSIS — G4733 Obstructive sleep apnea (adult) (pediatric): Secondary | ICD-10-CM | POA: Insufficient documentation

## 2011-06-30 DIAGNOSIS — I10 Essential (primary) hypertension: Secondary | ICD-10-CM | POA: Insufficient documentation

## 2011-06-30 DIAGNOSIS — D129 Benign neoplasm of anus and anal canal: Secondary | ICD-10-CM | POA: Insufficient documentation

## 2011-06-30 HISTORY — PX: TRANSANAL ENDOSCOPIC MICROSURGERY: SHX5281

## 2011-06-30 LAB — CBC
HCT: 43.3 % (ref 39.0–52.0)
Hemoglobin: 15.2 g/dL (ref 13.0–17.0)
MCH: 30.6 pg (ref 26.0–34.0)
MCHC: 35.1 g/dL (ref 30.0–36.0)
MCV: 87.1 fL (ref 78.0–100.0)
Platelets: 163 10*3/uL (ref 150–400)
RBC: 4.97 MIL/uL (ref 4.22–5.81)
RDW: 12.6 % (ref 11.5–15.5)
WBC: 11.8 10*3/uL — ABNORMAL HIGH (ref 4.0–10.5)

## 2011-06-30 LAB — CREATININE, SERUM
Creatinine, Ser: 1 mg/dL (ref 0.50–1.35)
GFR calc Af Amer: >90 mL/min (ref >90)
GFR calc non Af Amer: 85 mL/min — ABNORMAL LOW (ref >90)

## 2011-06-30 SURGERY — PARTIAL PROCTECTOMY BY TEM
Anesthesia: General | Site: Rectum | Laterality: Left | Wound class: Contaminated

## 2011-06-30 MED ORDER — LIP MEDEX EX OINT
1.0000 "application " | TOPICAL_OINTMENT | Freq: Two times a day (BID) | CUTANEOUS | Status: DC
  Administered 2011-06-30 – 2011-07-01 (×2): 1 via TOPICAL
  Filled 2011-06-30: qty 7

## 2011-06-30 MED ORDER — GLYCOPYRROLATE 0.2 MG/ML IJ SOLN
INTRAMUSCULAR | Status: DC | PRN
  Administered 2011-06-30: .5 mg via INTRAVENOUS

## 2011-06-30 MED ORDER — LACTATED RINGERS IV SOLN
INTRAVENOUS | Status: DC
  Administered 2011-07-01: 02:00:00 via INTRAVENOUS

## 2011-06-30 MED ORDER — MIDAZOLAM HCL 5 MG/5ML IJ SOLN
INTRAMUSCULAR | Status: DC | PRN
  Administered 2011-06-30: 2 mg via INTRAVENOUS

## 2011-06-30 MED ORDER — PROMETHAZINE HCL 25 MG/ML IJ SOLN: 6.2500 mg | INTRAMUSCULAR | Status: DC | PRN

## 2011-06-30 MED ORDER — FLEET ENEMA 7-19 GM/118ML RE ENEM: 1.0000 | ENEMA | Freq: Once | RECTAL | Status: DC

## 2011-06-30 MED ORDER — METRONIDAZOLE IN NACL 5-0.79 MG/ML-% IV SOLN: 500.0000 mg | INTRAVENOUS | Status: DC

## 2011-06-30 MED ORDER — ONDANSETRON HCL 4 MG/2ML IJ SOLN
4.0000 mg | Freq: Four times a day (QID) | INTRAMUSCULAR | Status: DC | PRN
  Administered 2011-06-30: 4 mg via INTRAVENOUS
  Filled 2011-06-30: qty 2

## 2011-06-30 MED ORDER — PSYLLIUM 95 % PO PACK
1.0000 | PACK | Freq: Two times a day (BID) | ORAL | Status: DC
  Administered 2011-06-30: 1 via ORAL
  Filled 2011-06-30 (×3): qty 1

## 2011-06-30 MED ORDER — ONDANSETRON HCL 4 MG PO TABS: 4.0000 mg | ORAL_TABLET | Freq: Four times a day (QID) | ORAL | Status: DC | PRN

## 2011-06-30 MED ORDER — NEOSTIGMINE METHYLSULFATE 1 MG/ML IJ SOLN
INTRAMUSCULAR | Status: DC | PRN
  Administered 2011-06-30: 4 mg via INTRAVENOUS

## 2011-06-30 MED ORDER — ACETAMINOPHEN 10 MG/ML IV SOLN
INTRAVENOUS | Status: DC | PRN
  Administered 2011-06-30: 1000 mg via INTRAVENOUS

## 2011-06-30 MED ORDER — LOSARTAN POTASSIUM 50 MG PO TABS
50.0000 mg | ORAL_TABLET | Freq: Every day | ORAL | Status: DC
  Filled 2011-06-30 (×2): qty 1

## 2011-06-30 MED ORDER — DEXTROSE 5 % IV SOLN
2.0000 g | INTRAVENOUS | Status: DC
  Filled 2011-06-30: qty 2

## 2011-06-30 MED ORDER — PROMETHAZINE HCL 25 MG/ML IJ SOLN: 12.5000 mg | Freq: Four times a day (QID) | INTRAMUSCULAR | Status: DC | PRN

## 2011-06-30 MED ORDER — MENTHOL 3 MG MT LOZG
1.0000 | LOZENGE | OROMUCOSAL | Status: DC | PRN
  Filled 2011-06-30: qty 9

## 2011-06-30 MED ORDER — SODIUM CHLORIDE 0.9 % IJ SOLN
INTRAMUSCULAR | Status: DC | PRN
  Administered 2011-06-30: 10 mL

## 2011-06-30 MED ORDER — KETOROLAC TROMETHAMINE 30 MG/ML IJ SOLN
INTRAMUSCULAR | Status: DC | PRN
  Administered 2011-06-30: 30 mg via INTRAVENOUS

## 2011-06-30 MED ORDER — SODIUM CHLORIDE 0.9 % IV SOLN
INTRAVENOUS | Status: AC
  Administered 2011-06-30: 16:00:00 via INTRAPERITONEAL
  Filled 2011-06-30: qty 6

## 2011-06-30 MED ORDER — BUPIVACAINE LIPOSOME 1.3 % IJ SUSP
20.0000 mL | Freq: Once | INTRAMUSCULAR | Status: AC
  Administered 2011-06-30: 20 mL
  Filled 2011-06-30: qty 20

## 2011-06-30 MED ORDER — LIDOCAINE HCL (CARDIAC) 20 MG/ML IV SOLN
INTRAVENOUS | Status: DC | PRN
  Administered 2011-06-30: 50 mg via INTRAVENOUS

## 2011-06-30 MED ORDER — PROPOFOL 10 MG/ML IV BOLUS
INTRAVENOUS | Status: DC | PRN
  Administered 2011-06-30: 200 mg via INTRAVENOUS

## 2011-06-30 MED ORDER — LORATADINE 10 MG PO TABS
10.0000 mg | ORAL_TABLET | Freq: Every day | ORAL | Status: DC
  Filled 2011-06-30 (×2): qty 1

## 2011-06-30 MED ORDER — SODIUM CHLORIDE 0.9 % IV SOLN
2.0000 g | INTRAVENOUS | Status: DC | PRN
  Administered 2011-06-30: 2 g via INTRAVENOUS

## 2011-06-30 MED ORDER — BUPIVACAINE-EPINEPHRINE 0.25% -1:200000 IJ SOLN
INTRAMUSCULAR | Status: AC
  Filled 2011-06-30: qty 1

## 2011-06-30 MED ORDER — FENTANYL CITRATE 0.05 MG/ML IJ SOLN
INTRAMUSCULAR | Status: DC | PRN
  Administered 2011-06-30 (×2): 50 ug via INTRAVENOUS
  Administered 2011-06-30: 100 ug via INTRAVENOUS
  Administered 2011-06-30 (×3): 50 ug via INTRAVENOUS

## 2011-06-30 MED ORDER — NAPROXEN 500 MG PO TABS
500.0000 mg | ORAL_TABLET | Freq: Two times a day (BID) | ORAL | Status: DC
  Filled 2011-06-30 (×4): qty 1

## 2011-06-30 MED ORDER — ONDANSETRON HCL 4 MG/2ML IJ SOLN
INTRAMUSCULAR | Status: DC | PRN
  Administered 2011-06-30: 4 mg via INTRAVENOUS

## 2011-06-30 MED ORDER — HYDROMORPHONE HCL PF 1 MG/ML IJ SOLN
0.2500 mg | INTRAMUSCULAR | Status: DC | PRN
  Administered 2011-06-30 (×2): 0.5 mg via INTRAVENOUS

## 2011-06-30 MED ORDER — 0.9 % SODIUM CHLORIDE (POUR BTL) OPTIME
TOPICAL | Status: DC | PRN
  Administered 2011-06-30: 1000 mL

## 2011-06-30 MED ORDER — OXYCODONE HCL 5 MG PO TABS: 5.0000 mg | ORAL_TABLET | ORAL | Status: DC | PRN

## 2011-06-30 MED ORDER — LACTATED RINGERS IV SOLN
INTRAVENOUS | Status: DC
  Administered 2011-06-30: 1000 mL via INTRAVENOUS
  Administered 2011-06-30: 15:00:00 via INTRAVENOUS

## 2011-06-30 MED ORDER — DEXAMETHASONE SODIUM PHOSPHATE 10 MG/ML IJ SOLN
INTRAMUSCULAR | Status: DC | PRN
  Administered 2011-06-30: 10 mg via INTRAVENOUS

## 2011-06-30 MED ORDER — METRONIDAZOLE IN NACL 5-0.79 MG/ML-% IV SOLN
INTRAVENOUS | Status: AC
  Filled 2011-06-30: qty 100

## 2011-06-30 MED ORDER — SACCHAROMYCES BOULARDII 250 MG PO CAPS
250.0000 mg | ORAL_CAPSULE | Freq: Two times a day (BID) | ORAL | Status: DC
  Filled 2011-06-30 (×3): qty 1

## 2011-06-30 MED ORDER — SODIUM CHLORIDE 0.9 % IV SOLN
INTRAVENOUS | Status: DC
  Filled 2011-06-30: qty 6

## 2011-06-30 MED ORDER — DIPHENHYDRAMINE HCL 50 MG/ML IJ SOLN: 12.5000 mg | Freq: Four times a day (QID) | INTRAMUSCULAR | Status: DC | PRN

## 2011-06-30 MED ORDER — HEPARIN SODIUM (PORCINE) 5000 UNIT/ML IJ SOLN
5000.0000 [IU] | Freq: Three times a day (TID) | INTRAMUSCULAR | Status: DC
  Administered 2011-07-01: 5000 [IU] via SUBCUTANEOUS
  Filled 2011-06-30 (×4): qty 1

## 2011-06-30 MED ORDER — OXYCODONE HCL 5 MG PO TABS: 5.0000 mg | ORAL_TABLET | ORAL | Status: AC | PRN

## 2011-06-30 MED ORDER — HYDROMORPHONE HCL PF 1 MG/ML IJ SOLN
INTRAMUSCULAR | Status: AC
  Filled 2011-06-30: qty 1

## 2011-06-30 MED ORDER — METRONIDAZOLE IN NACL 5-0.79 MG/ML-% IV SOLN
INTRAVENOUS | Status: DC | PRN
  Administered 2011-06-30: 500 mg via INTRAVENOUS

## 2011-06-30 MED ORDER — METRONIDAZOLE IN NACL 5-0.79 MG/ML-% IV SOLN
500.0000 mg | Freq: Four times a day (QID) | INTRAVENOUS | Status: DC
  Administered 2011-06-30 – 2011-07-01 (×3): 500 mg via INTRAVENOUS
  Filled 2011-06-30 (×4): qty 100

## 2011-06-30 MED ORDER — MAGIC MOUTHWASH
15.0000 mL | Freq: Four times a day (QID) | ORAL | Status: DC | PRN
  Filled 2011-06-30: qty 15

## 2011-06-30 MED ORDER — HYDROMORPHONE HCL PF 1 MG/ML IJ SOLN: 0.5000 mg | INTRAMUSCULAR | Status: DC | PRN

## 2011-06-30 MED ORDER — ROCURONIUM BROMIDE 100 MG/10ML IV SOLN
INTRAVENOUS | Status: DC | PRN
  Administered 2011-06-30: 5 mg via INTRAVENOUS
  Administered 2011-06-30: 10 mg via INTRAVENOUS
  Administered 2011-06-30: 50 mg via INTRAVENOUS
  Administered 2011-06-30: 5 mg via INTRAVENOUS

## 2011-06-30 MED ORDER — ACETAMINOPHEN 10 MG/ML IV SOLN
INTRAVENOUS | Status: AC
  Filled 2011-06-30: qty 100

## 2011-06-30 SURGICAL SUPPLY — 75 items
BLADE SURG SZ10 CARB STEEL (BLADE) IMPLANT
BRIEF STRETCH FOR OB PAD LRG (UNDERPADS AND DIAPERS) IMPLANT
CATH FOLEY 3WAY 30CC 26FR (CATHETERS) IMPLANT
CATH FOLEY SILVER 30CC 26FR (CATHETERS) IMPLANT
CLIP SUT LAPRA TY ABSORB (SUTURE) IMPLANT
CLOTH BEACON ORANGE TIMEOUT ST (SAFETY) ×2 IMPLANT
COVER MAYO STAND STRL (DRAPES) IMPLANT
DEVICE SUT QUICK LOAD TK 5 (STAPLE) IMPLANT
DEVICE SUT TI-KNOT TK 5X26 (MISCELLANEOUS) IMPLANT
DEVICE SUTURE ENDOST 10MM (ENDOMECHANICALS) ×2 IMPLANT
DRAPE C-ARM 42X72 X-RAY (DRAPES) ×1 IMPLANT
DRAPE CAMERA CLOSED 9X96 (DRAPES) ×2 IMPLANT
DRAPE LAPAROSCOPIC ABDOMINAL (DRAPES) IMPLANT
DRAPE LAPAROTOMY T 102X78X121 (DRAPES) IMPLANT
DRAPE LG THREE QUARTER DISP (DRAPES) IMPLANT
DRAPE WARM FLUID 44X44 (DRAPE) ×2 IMPLANT
ELECT BLADE 6.5 EXT (BLADE) IMPLANT
ELECT CAUTERY BLADE 6.4 (BLADE) IMPLANT
ELECT REM PT RETURN 9FT ADLT (ELECTROSURGICAL) ×2
ELECTRODE REM PT RTRN 9FT ADLT (ELECTROSURGICAL) ×1 IMPLANT
GAUZE SPONGE 4X4 16PLY XRAY LF (GAUZE/BANDAGES/DRESSINGS) IMPLANT
GLOVE ECLIPSE 8.0 STRL XLNG CF (GLOVE) ×2 IMPLANT
GLOVE INDICATOR 8.0 STRL GRN (GLOVE) ×4 IMPLANT
GOWN STRL NON-REIN LRG LVL3 (GOWN DISPOSABLE) ×2 IMPLANT
GOWN STRL REIN XL XLG (GOWN DISPOSABLE) ×4 IMPLANT
HEMOSTAT SURGICEL 4X8 (HEMOSTASIS) IMPLANT
HOOK RETRACTION 12 ELAST STAY (MISCELLANEOUS) IMPLANT
IV LACTATED RINGERS 1000ML (IV SOLUTION) IMPLANT
KIT BASIN OR (CUSTOM PROCEDURE TRAY) ×2 IMPLANT
LEGGING LITHOTOMY PAIR STRL (DRAPES) IMPLANT
LUBRICANT JELLY K Y 4OZ (MISCELLANEOUS) ×2 IMPLANT
NDL INSUFFLATION 14GA 120MM (NEEDLE) IMPLANT
NEEDLE HYPO 22GX1.5 SAFETY (NEEDLE) IMPLANT
NEEDLE INSUFFLATION 14GA 120MM (NEEDLE) IMPLANT
NS IRRIG 1000ML POUR BTL (IV SOLUTION) ×2 IMPLANT
PACK GENERAL/GYN (CUSTOM PROCEDURE TRAY) IMPLANT
PENCIL BUTTON HOLSTER BLD 10FT (ELECTRODE) IMPLANT
RELOAD ENDO STITCH 2.0 (ENDOMECHANICALS) ×4
RELOAD SUT SNGL STCH ABSRB 2-0 (ENDOMECHANICALS) ×1 IMPLANT
RETRACTOR LONRSTAR 16.6X16.6CM (MISCELLANEOUS) IMPLANT
RETRACTOR STAY HOOK 5MM (MISCELLANEOUS) ×1 IMPLANT
RETRACTOR STER APS 16.6X16.6CM (MISCELLANEOUS) ×2
RETRACTOR WILSON SYSTEM (INSTRUMENTS) IMPLANT
SCALPEL HARMONIC ACE (MISCELLANEOUS) ×1 IMPLANT
SCISSORS LAP 5X35 DISP (ENDOMECHANICALS) IMPLANT
SPONGE GAUZE 4X4 12PLY (GAUZE/BANDAGES/DRESSINGS) ×1 IMPLANT
SPONGE HEMORRHOID 8X3CM (HEMOSTASIS) IMPLANT
SPONGE LAP 18X18 X RAY DECT (DISPOSABLE) IMPLANT
SPONGE SURGIFOAM ABS GEL 100 (HEMOSTASIS) IMPLANT
STOPCOCK K 69 2C6206 (IV SETS) ×2 IMPLANT
SUCTION POOLE TIP (SUCTIONS) IMPLANT
SUT PDS AB 2-0 CT2 27 (SUTURE) ×6 IMPLANT
SUT PDS AB 3-0 SH 27 (SUTURE) ×4 IMPLANT
SUT RELOAD ENDO STITCH 2 48X1 (ENDOMECHANICALS) ×2
SUT SILK 2 0 (SUTURE)
SUT SILK 2 0 SH CR/8 (SUTURE) IMPLANT
SUT SILK 2-0 18XBRD TIE 12 (SUTURE) IMPLANT
SUT SILK 3 0 SH 30 (SUTURE) IMPLANT
SUT SILK 3 0 SH CR/8 (SUTURE) IMPLANT
SUT VIC AB 2-0 UR6 27 (SUTURE) IMPLANT
SUT VIC AB 3-0 SH 18 (SUTURE) IMPLANT
SUT VIC AB 3-0 SH 27 (SUTURE)
SUT VIC AB 3-0 SH 27XBRD (SUTURE) IMPLANT
SUT VIC AB 4-0 SH 18 (SUTURE) IMPLANT
SUTURE RELOAD END STTCH 2 48X1 (ENDOMECHANICALS) ×2 IMPLANT
SYR 20CC LL (SYRINGE) IMPLANT
SYR 30ML LL (SYRINGE) IMPLANT
SYR BULB IRRIGATION 50ML (SYRINGE) IMPLANT
SYRINGE IRR TOOMEY STRL 70CC (SYRINGE) IMPLANT
TAPE CLOTH SURG 6X10 WHT LF (GAUZE/BANDAGES/DRESSINGS) ×1 IMPLANT
TOWEL OR 17X26 10 PK STRL BLUE (TOWEL DISPOSABLE) ×2 IMPLANT
TRAY FOLEY CATH 14FRSI W/METER (CATHETERS) ×2 IMPLANT
TUBING CONNECTING 10 (TUBING) ×1 IMPLANT
YANKAUER SUCT BULB TIP 10FT TU (MISCELLANEOUS) IMPLANT
YANKAUER SUCT BULB TIP NO VENT (SUCTIONS) IMPLANT

## 2011-06-30 NOTE — Brief Op Note (Signed)
06/30/2011  5:01 PM  PATIENT:  Richard Henderson  51 y.o. male  Patient Care Team: Edwyna Perfect, MD as PCP - General (Internal Medicine) Beverley Fiedler, MD as Consulting Physician (Gastroenterology)  PRE-OPERATIVE DIAGNOSIS:  mid rectal polyp left lateral anterior  POST-OPERATIVE DIAGNOSIS:  Mid/distal rectal polyp left lateral anterior  PROCEDURE:  Procedure(s) (LRB): PARTIAL PROCTECTOMY BY TEM (Left)  SURGEON:  Surgeon(s) and Role:    * Ardeth Sportsman, MD - Primary  PHYSICIAN ASSISTANT:   ASSISTANTS: none   ANESTHESIA:   local and general  EBL:  Total I/O In: 1400 [I.V.:1400] Out: 175 [Urine:75; Blood:100]  BLOOD ADMINISTERED:none  DRAINS: none   LOCAL MEDICATIONS USED:  Liposomal bupivacaine x 30mL for anorectal block  SPECIMEN:  Source of Specimen:  Rectal polyp  DISPOSITION OF SPECIMEN:  PATHOLOGY  COUNTS:  YES  TOURNIQUET:  * No tourniquets in log *  DICTATION: .Other Dictation: Dictation Number [id number]  PLAN OF CARE: Admit for overnight observation  PATIENT DISPOSITION:  PACU - hemodynamically stable.   Delay start of Pharmacological VTE agent (>24hrs) due to surgical blood loss or risk of bleeding: no

## 2011-06-30 NOTE — H&P (Signed)
Richard Henderson  November 14, 1960 [id number]  CARE TEAM:  PCP: Letitia Libra, Ala Dach, MD  Outpatient Care Team: Patient Care Team: Edwyna Perfect, MD as PCP - General (Internal Medicine) Beverley Fiedler, MD as Consulting Physician (Gastroenterology)  Inpatient Treatment Team: Treatment Team: Attending Provider: Ardeth Sportsman, MD   Reason for visit: Probable rectal polyp. Question need for TEM partial proctectomy.  Patient is a pleasant healthy male who was found to have numerous polyps by colonoscopy on screening. He does confess he's had a few episodes of blood with his bowel movements for the past few months before his colonoscopy.   During colonoscopy, Dr. Rhea Belton found and removed numerous polyps. However, he saw a 3 cm rectal polyp too large to safely remove in the distal rectum. Biopsy showed adenomatous changes. He was sent for consideration of excision. My partner, Dr. Carolynne Edouard, cannot definitely feel a lesion. He was worried it may be more proximal & require TEM partial proctectomy. The patient wished to discuss that option with me so Dr. Carolynne Edouard referred the patient to me.   Otherwise very active. No prior bowel surgeries. BM daily   Patient Active Problem List  Diagnosis  . ALLERGIC RHINITIS  . GERD  . BACK PAIN, THORACIC REGION, LEFT  . NEVI, MULTIPLE  . HYPERLIPIDEMIA  . HYPERTENSION  . SLEEP APNEA, OBSTRUCTIVE  . Left ankle swelling  . Colon cancer screening  . Tubulovillous adenoma of rectum    Past Medical History  Diagnosis Date  . Allergic rhinitis   . Hyperlipidemia   . Hypertension   . Colonic mass   . Eczema   . Sleep apnea     USES C-PAP    Past Surgical History  Procedure Date  . Wisdom tooth extraction 2012    History   Social History  . Marital Status: Married    Spouse Name: N/A    Number of Children: 3  . Years of Education: N/A   Occupational History  . Application Systems Analyst    Social History Main Topics  . Smoking status: Never  Smoker   . Smokeless tobacco: Never Used  . Alcohol Use: Yes     Rare  . Drug Use: No  . Sexually Active: Not on file   Other Topics Concern  . Not on file   Social History Narrative  . No narrative on file    Family History  Problem Relation Age of Onset  . Prostate cancer Father   . Alzheimer's disease Father   . Cancer Father     prostate  . Arthritis Other     Current Facility-Administered Medications  Medication Dose Route Frequency Provider Last Rate Last Dose  . cefTRIAXone (ROCEPHIN) 2 g in dextrose 5 % 50 mL IVPB  2 g Intravenous On Call to OR Richard Sportsman, MD      . lactated ringers infusion   Intravenous Continuous Einar Pheasant, MD 125 mL/hr at 06/30/11 1320 1,000 mL at 06/30/11 1320  . metroNIDAZOLE (FLAGYL) IVPB 500 mg  500 mg Intravenous On Call to OR Richard Sportsman, MD      . sodium phosphate (FLEET) 7-19 GM/118ML enema 1 enema  1 enema Rectal Once Richard Sportsman, MD         No Known Allergies  ROS: Constitutional:  No fevers, chills, sweats.  Weight stable Eyes:  No vision changes, No discharge HENT:  No sore throats, nasal drainage Lymph: No neck swelling, No bruising easily  Pulmonary:  No cough, productive sputum CV: No orthopnea, PND  Patient walks 90 minutes for about 3 miles without difficulty.  No exertional chest/neck/shoulder/arm pain. GI: No family history of GI/colon cancer, inflammatory bowel disease, irritable bowel syndrome, allergy such as Celiac Sprue, dietary/dairy problems, colitis, ulcers nor gastritis.  Anal bleeding No recent sick contacts/gastroenteritis.  No travel outside the country.  No changes in diet. Renal: No UTIs, No hematuria Genital:  No drainage, bleeding, masses Musculoskeletal: No severe joint pain.  Good ROM major joints Skin:  No sores or lesions.  No rashes Heme/Lymph:  No easy bleeding.  No swollen lymph nodes  BP 136/81  Pulse 73  Temp 98.9 F (37.2 C) (Oral)  Resp 18  SpO2 97%  Physical  Exam: Physical Exam  Constitutional: He is oriented to person, place, and time. He appears well-developed and well-nourished. No distress.  HENT:  Head: Normocephalic.  Mouth/Throat: Oropharynx is clear and moist. No oropharyngeal exudate.  Eyes: Conjunctivae and EOM are normal. Pupils are equal, round, and reactive to light. No scleral icterus.  Neck: Normal range of motion. Neck supple. No tracheal deviation present.  Cardiovascular: Normal rate, regular rhythm and intact distal pulses.  Pulmonary/Chest: Effort normal and breath sounds normal. No respiratory distress.  Abdominal: Soft. He exhibits no distension. There is no tenderness. There is no guarding. Hernia confirmed negative in the right inguinal area and confirmed negative in the left inguinal area.  Small umb hernia ~ 3mm  Genitourinary:  Perianal skin clean with good hygiene. No pruritis. No external skin tags / hemorrhoids of significance. No pilonidal disease. No fissure. No abscess/fistula.    Musculoskeletal: Normal range of motion. He exhibits no tenderness.  Lymphadenopathy:  He has no cervical adenopathy.  Right: No inguinal adenopathy present.  Left: No inguinal adenopathy present.  Neurological: He is alert and oriented to person, place, and time. No cranial nerve deficit. He exhibits normal muscle tone. Coordination normal.  Skin: Skin is warm and dry. No rash noted. He is not diaphoretic. No erythema. No pallor.  Psychiatric: He has a normal mood and affect. His behavior is normal. Judgment and thought content normal.      Results:   Labs: No results found for this or any previous visit (from the past 48 hour(s)).  Imaging / Studies: Dg Chest 2 View  06/23/2011  *RADIOLOGY REPORT*  Clinical Data: Preop  CHEST - 2 VIEW  Comparison: 01/10/2009  Findings: Cardiomediastinal silhouette is within normal limits. Lungs are clear.  No pneumothorax or pleural effusion.  IMPRESSION: No active cardiopulmonary disease.   Original Report Authenticated By: Donavan Burnet, M.D.    Medications / Allergies: per chart  Antibiotics: Anti-infectives     Start     Dose/Rate Route Frequency Ordered Stop   06/30/11 1200   cefTRIAXone (ROCEPHIN) 2 g in dextrose 5 % 50 mL IVPB     Comments: Pharmacy may adjust dosing strength per protocol      2 g 100 mL/hr over 30 Minutes Intravenous On call to O.R. 06/30/11 1118 07/01/11 0559   06/30/11 1130   metroNIDAZOLE (FLAGYL) IVPB 500 mg        500 mg 100 mL/hr over 60 Minutes Intravenous On call to O.R. 06/30/11 1118 07/01/11 0559          Assessment  Lorenda Peck  51 y.o. male  Day of Surgery  Procedure(s): PARTIAL PROCTECTOMY BY TEM  Problem List:  Active Problems:  * No active hospital problems. *  Rectal polyp, needs for removal   Plan:    I think he has a classic location where Partial proctectomy for definitive excisional biopsy by TEM would be of benefit. Plan left side down decubitus since it primarily left lateral going to the anterior region. Hopefully just an overnight stay. I did discuss procedure with him in detail:  The anatomy & physiology of the digestive tract was discussed. The pathophysiology of the rectal pathology was discussed. Natural history risks without surgery was discussed. I feel the risks of no intervention will lead to serious problems that outweigh the operative risks; therefore, I recommended surgery.  Laparoscopic & open abdominal techniques were discussed. I recommended we start with a partial proctectomy by transanal endoscopic microsurgery (TEM) for excisional biopsy to remove the pathology and hopefully cure and/or control the pathology. This technique can offer less operative risk and faster post-operative recovery. Possible need for immediate or later abdominal surgery for further treatment was discussed.  Risks such as bleeding, abscess, reoperation, ostomy, heart attack, death, and other risks were discussed. I noted  a good likelihood this will help address the problem. Goals of post-operative recovery were discussed as well. We will work to minimize complications. An educational handout was given as well. Questions were answered. The patient expresses understanding & wishes to proceed with surgery.    -VTE prophylaxis- SCDs, etc -mobilize as tolerated to help recovery  Richard Henderson, M.D., F.A.C.S. Gastrointestinal and Minimally Invasive Surgery [facility] [address] [place], [place] [address] [phone] Main / Paging [phone] Voice Mail   06/30/2011

## 2011-06-30 NOTE — Anesthesia Preprocedure Evaluation (Signed)
Anesthesia Evaluation  Patient identified by MRN, date of birth, ID band Patient awake    Reviewed: Allergy & Precautions, H&P , NPO status , Patient's Chart, lab work & pertinent test results  Airway Mallampati: II TM Distance: >3 FB Neck ROM: Full    Dental No notable dental hx.    Pulmonary sleep apnea ,  breath sounds clear to auscultation  Pulmonary exam normal       Cardiovascular hypertension, Pt. on medications Rhythm:Regular Rate:Normal     Neuro/Psych negative neurological ROS  negative psych ROS   GI/Hepatic Neg liver ROS, GERD-  ,  Endo/Other  negative endocrine ROS  Renal/GU negative Renal ROS  negative genitourinary   Musculoskeletal negative musculoskeletal ROS (+)   Abdominal   Peds negative pediatric ROS (+)  Hematology negative hematology ROS (+)   Anesthesia Other Findings   Reproductive/Obstetrics negative OB ROS                           Anesthesia Physical Anesthesia Plan  ASA: II  Anesthesia Plan: General   Post-op Pain Management:    Induction: Intravenous  Airway Management Planned: Oral ETT  Additional Equipment:   Intra-op Plan:   Post-operative Plan: Extubation in OR  Informed Consent: I have reviewed the patients History and Physical, chart, labs and discussed the procedure including the risks, benefits and alternatives for the proposed anesthesia with the patient or authorized representative who has indicated his/her understanding and acceptance.   Dental advisory given  Plan Discussed with: CRNA  Anesthesia Plan Comments:         Anesthesia Quick Evaluation

## 2011-06-30 NOTE — Transfer of Care (Signed)
Immediate Anesthesia Transfer of Care Note  Patient: Richard Henderson  Procedure(s) Performed: Procedure(s) (LRB): PARTIAL PROCTECTOMY BY TEM (Left)  Patient Location: PACU  Anesthesia Type: General  Level of Consciousness: awake, alert  and patient cooperative  Airway & Oxygen Therapy: Patient Spontanous Breathing and Patient connected to face mask oxygen  Post-op Assessment: Report given to PACU RN and Post -op Vital signs reviewed and stable  Post vital signs: Reviewed and stable  Complications: No apparent anesthesia complications

## 2011-06-30 NOTE — Pre-Procedure Instructions (Signed)
Teach Back

## 2011-06-30 NOTE — Discharge Instructions (Signed)
ANORECTAL SURGERY: POST OP INSTRUCTIONS  1. Take your usually prescribed home medications unless otherwise directed. 2. DIET: Follow a light bland diet the first 24 hours after arrival home, such as soup, liquids, crackers, etc.  Be sure to include lots of fluids daily.  Avoid fast food or heavy meals as your are more likely to get nauseated.  Eat a low fat the next few days after surgery.   3. PAIN CONTROL: a. Pain is best controlled by a usual combination of three different methods TOGETHER: i. Ice/Heat ii. Over the counter pain medication iii. Prescription pain medication b. Most patients will experience some swelling and discomfort in the anus/rectal area. and incisions.  Ice packs or heat (30-60 minutes up to 6 times a day) will help. Use ice for the first few days to help decrease swelling and bruising, then switch to heat such as warm towels, sitz baths, warm baths, etc to help relax tight/sore spots and speed recovery.  Some people prefer to use ice alone, heat alone, alternating between ice & heat.  Experiment to what works for you.  Swelling and bruising can take several weeks to resolve.   c. It is helpful to take an over-the-counter pain medication regularly for the first few weeks.  Choose one of the following that works best for you: i. Naproxen (Aleve, etc)  Two 220mg tabs twice a day ii. Ibuprofen (Advil, etc) Three 200mg tabs four times a day (every meal & bedtime) iii. Acetaminophen (Tylenol, etc) 500-650mg four times a day (every meal & bedtime) d. A  prescription for pain medication (such as oxycodone, hydrocodone, etc) should be given to you upon discharge.  Take your pain medication as prescribed.  i. If you are having problems/concerns with the prescription medicine (does not control pain, nausea, vomiting, rash, itching, etc), please call us [phone] to see if we need to switch you to a different pain medicine that will work better for you and/or control your side effect  better. ii. If you need a refill on your pain medication, please contact your pharmacy.  They will contact our office to request authorization. Prescriptions will not be filled after 5 pm or on week-ends. 4. KEEP YOUR BOWELS REGULAR a. The goal is one bowel movement a day b. Avoid getting constipated.  Between the surgery and the pain medications, it is common to experience some constipation.  Increasing fluid intake and taking a fiber supplement (such as Metamucil, Citrucel, FiberCon, MiraLax, etc) 1-2 times a day regularly will usually help prevent this problem from occurring.  A mild laxative (prune juice, Milk of Magnesia, MiraLax, etc) should be taken according to package directions if there are no bowel movements after 48 hours. c. Watch out for diarrhea.  If you have many loose bowel movements, simplify your diet to bland foods & liquids for a few days.  Stop any stool softeners and decrease your fiber supplement.  Switching to mild anti-diarrheal medications (Kayopectate, Pepto Bismol) can help.  If this worsens or does not improve, please call us.  5. Wound Care a. Remove your bandages the day after surgery.  Unless discharge instructions indicate otherwise, leave your bandage dry and in place overnight.  Remove the bandage during your first bowel movement.   b. Allow the wound packing to fall out over the next few days.  You can trim exposed gauze / ribbon as it falls out.  You do not need to repack the wound unless instructed otherwise.  Wear an   absorbent pad or soft cotton gauze in your underwear as needed to catch any drainage and help keep the area  c. Keep the area clean and dry.  Bathe / shower every day.  Keep the area clean by showering / bathing over the incision / wound.   It is okay to soak an open wound to help wash it.  Wet wipes or showers / gentle washing after bowel movements is often less traumatic than regular toilet paper. d. You may have some styrofoam-like soft packing in  the rectum which will come out with the first bowel movement.  e. You will often notice bleeding with bowel movements.  This should slow down by the end of the first week of surgery f. Expect some drainage.  This should slow down, too, by the end of the first week of surgery.  Wear an absorbent pad or soft cotton gauze in your underwear until the drainage stops. 6. ACTIVITIES as tolerated:   a. You may resume regular (light) daily activities beginning the next day--such as daily self-care, walking, climbing stairs--gradually increasing activities as tolerated.  If you can walk 30 minutes without difficulty, it is safe to try more intense activity such as jogging, treadmill, bicycling, low-impact aerobics, swimming, etc. b. Save the most intensive and strenuous activity for last such as sit-ups, heavy lifting, contact sports, etc  Refrain from any heavy lifting or straining until you are off narcotics for pain control.   c. DO NOT PUSH THROUGH PAIN.  Let pain be your guide: If it hurts to do something, don't do it.  Pain is your body warning you to avoid that activity for another week until the pain goes down. d. You may drive when you are no longer taking prescription pain medication, you can comfortably sit for long periods of time, and you can safely maneuver your car and apply brakes. e. You may have sexual intercourse when it is comfortable.  7. FOLLOW UP in our office a. Please call [facility] at [phone] to set up an appointment to see your surgeon in the office for a follow-up appointment approximately 2 weeks after your surgery. b. Make sure that you call for this appointment the day you arrive home to insure a convenient appointment time. 10. IF YOU HAVE DISABILITY OR FAMILY LEAVE FORMS, BRING THEM TO THE OFFICE FOR PROCESSING.  DO NOT GIVE THEM TO YOUR DOCTOR.        WHEN TO CALL US [phone]: 1. Poor pain control 2. Reactions / problems with new medications (rash/itching, nausea,  etc)  3. Fever over 101.5 F (38.5 C) 4. Inability to urinate 5. Nausea and/or vomiting 6. Worsening swelling or bruising 7. Continued bleeding from incision. 8. Increased pain, redness, or drainage from the incision  The clinic staff is available to answer your questions during regular business hours (8:30am-5pm).  Please don't hesitate to call and ask to speak to one of our nurses for clinical concerns.   A surgeon from [facility] is always on call at the hospitals   If you have a medical emergency, go to the nearest emergency room or call 911.    [facility] [address], [address], [place], [place]  [address] ? MAIN: [phone] ? TOLL FREE: [phone] ? FAX [phone] [email or web address]    

## 2011-06-30 NOTE — Op Note (Signed)
NAMEDYSHAWN, CANGELOSI NO.:  [id number]  MEDICAL RECORD NO.:  [id number]  LOCATION:  1527                         FACILITY:  [facility]  PHYSICIAN:  Ardeth Sportsman, MD     DATE OF BIRTH:  1960/06/08  DATE OF PROCEDURE:  06/30/2011 DATE OF DISCHARGE:                              OPERATIVE REPORT   PRIMARY CARE PHYSICIAN:  Charlynn Court, MD  GASTROLOGIST:  Erick Blinks, MD at [facility]  SURGEON:  Ardeth Sportsman, MD  ASSISTANT:  R.N.  PREOPERATIVE DIAGNOSIS:  Mid/distal left anterior rectal adenomatous polyp.  POSTOPERATIVE DIAGNOSIS:  Mid/distal left anterior rectal adenomatous polyp.  PROCEDURE PERFORMED:  Partial proctectomy of polyp by transanal endoscopic microsurgery (TEM).  SPECIMEN:  Rectal polyp.  Pins as noted on the pathology report.  I believe green pins are distal.  Red pins are proximal.  DRAINS:  None.  ESTIMATED BLOOD LOSS:  30 mL.  COMPLICATIONS:  None major.  INDICATIONS:  Mr. Gracy is a 51 year old male who was found on screening colonoscopy to have a large polyp in his mid distal rectum. He was sent to Dr. Carolynne Edouard of our group.  He was worried it was a little too proximal to remove transanally and therefore referred the patient to me for TEM partial proctectomy.  Biopsy consistent with adenomatous changes.  Pathophysiology of polyps was discussed and natural history of risks were discussed.  Options were discussed and recommendations made for full-thickness excisional biopsy with good margins by TEM partial proctectomy.  Possibility of need for further surgery if it is a cancer or other results were discussed.  Risks, benefits, alternatives discussed.  Questions answered.  He agreed to proceed.  OPERATIVE FINDINGS:  He had a large 5 x 4-cm flat, sessile, adenomatous polyp without any hard ulcerations or firmness.  It was primarily in the left lateral and anterior aspect.  It started about 5 cm from the anal verge and went  much more proximally.  DESCRIPTION OF PROCEDURE:  Informed consent was confirmed.  The patient underwent general anesthesia without any difficulty.  He was positioned in the left lateral decubitus position with a sand bag and careful taping projection of his legs and arms.  A Foley catheter had been sterilely placed.  He had sequential compression devices active during the entire case.  Surgical time-out confirmed our plan.  I did digital rectal examination to confirm correct positioning of the patient with the polyp being closer to the floor.  I did gentle finger dilation to allow the 4-cm TEO Storz proctoscope to be placed.  I induced carbon dioxide insufflation.  I could easily see the polyp.  It was initially in the more distal than thought.  I did use needle-tip point cautery and marked 1-1.5 cm margins circumferentially.  I scored through the rectal wall, full-thickness at the anterior and proximal margin.  Once I had gotten full thickness, I transitioned over to Harmonic scalpel dissection.  At the distal end, I had to free it off the sphincter complex and levators.  This was the source of most of the bleeding initially, but eventually I got that under control.  I came around on the anterior margin and came  around the posterior margin. With that, I could get more into classic mesorectum and began to free that off the lateral sidewall and a little bit posteriorly as well.  I followed until I came to the more proximal margin and eventually by moving the whole polyp side to side, I could come around proximally.  I noted bleeding at the base of the wound.  I controlled that with touch point cautery with a fine tip grasper.  I did copious irrigation.  I later transitioned over to antibiotic irrigation (900 mg of clindamycin/250 mg of gentamicin in 1 L of crystalloid).  I marked the polyp as noted above.  I walked it to pathology and showed the pins for proper orientation to the  pathologist tech.  He expressed understanding.  I went back and scrubbed in.  I had packed the wound with some gauze and I removed that with small blood.  I ensured hemostasis.  I reapproximated the wound by using a Endo Stitch 2-0 interrupted horizontal mattress sutures to help bring the proximal end down towards the very distal rectum almost near to the anus to the level of the white line at this point.  I then transitioned over and placed a [place] to expose the anal canal and distal rectum.  With retractors, I could see the closure and I reapproximated with 2-0 PDS interrupted horizontal mattress stitches.  It closed the wound well. The incision went from the left posterior to the right anterior midline involving over half of the circumference.  Closure was intact. There was a nice wide opening that could easily allow a large anoscope through.  Hemostasis was excellent.  I removed instrumentation.  The patient is being extubated to go to recovery room.  I discussed the patient's status to the family.  Questions were answered.  They expressed understanding & appreciation.      Ardeth Sportsman, MD     SCG/MEDQ  D:  06/30/2011  T:  06/30/2011  Job:  [id number]  cc:   Charlynn Court, MD  Erick Blinks, MD Fax: [phone]  Ollen Macil Crady. Vernell Morgans, M.D. [address] [place] [address]

## 2011-06-30 NOTE — Anesthesia Postprocedure Evaluation (Signed)
Anesthesia Post Note  Patient: Richard Henderson  Procedure(s) Performed: Procedure(s) (LRB): PARTIAL PROCTECTOMY BY TEM (Left)  Anesthesia type: General  Patient location: PACU  Post pain: Pain level controlled  Post assessment: Post-op Vital signs reviewed  Last Vitals:  Filed Vitals:   06/30/11 1800  BP: 143/70  Pulse: 78  Temp: 37.1 C  Resp: 14    Post vital signs: Reviewed  Level of consciousness: sedated  Complications: No apparent anesthesia complications

## 2011-06-30 NOTE — Progress Notes (Signed)
States compliant with bowel prep as directed by Dr. Michaell Cowing Enema this am.

## 2011-06-30 NOTE — Preoperative (Signed)
Beta Blockers   Reason not to administer Beta Blockers:Not Applicable 

## 2011-07-01 MED ORDER — SODIUM CHLORIDE 0.9 % IJ SOLN
3.0000 mL | Freq: Two times a day (BID) | INTRAMUSCULAR | Status: DC
  Administered 2011-07-01: 3 mL via INTRAVENOUS

## 2011-07-01 MED ORDER — SODIUM CHLORIDE 0.9 % IJ SOLN: 3.0000 mL | INTRAMUSCULAR | Status: DC | PRN

## 2011-07-01 NOTE — Discharge Summary (Signed)
Physician Discharge Summary  Patient ID: Richard Henderson MRN: [id number] DOB/AGE: Feb 19, 1960 51 y.o.  Admit date: 06/30/2011 Discharge date: 07/01/2011  Patient Care Team: Edwyna Perfect, MD as PCP - General (Internal Medicine) Beverley Fiedler, MD as Consulting Physician (Gastroenterology)  Admission Diagnoses: Principal Problem:  *Tubulovillous adenoma of rectum  Discharge Diagnoses:  Principal Problem:  *Tubulovillous adenoma of rectum   Discharged Condition: good  Hospital Course: Pt underwent TEM resection of polyp.  He was observed overnight.  By the time of D/C, he was  Tolerating oral intake well Ambulating in walkways Adequate pain control without IV medications Urinating  Having flatus   Therefore, I felt it was safe to be d/c home.   I discussed the patient's status.  Questions were answered.  The patient expressed understanding & appreciation.  Consults: None  Significant Diagnostic Studies:   Treatments: surgery: TEM partial proctectomy of large adenoma  Discharge Exam: Blood pressure 118/67, pulse 81, temperature 97.8 F (36.6 C), temperature source Oral, resp. rate 18, SpO2 97.00%.  General: Pt awake/alert/oriented x4 in no major acute distress Eyes: PERRL, normal EOM. Sclera nonicteric Neuro: CN II-XII intact w/o focal sensory/motor deficits. Lymph: No head/neck/groin lymphadenopathy Psych:  No delerium/psychosis/paranoia HENT: Normocephalic, Mucus membranes moist.  No thrush Neck: Supple, No tracheal deviation Chest: No pain.  Good respiratory excursion. CV:  Pulses intact.  Regular rhythm Abdomen: Soft, Nondistended.  Nontender.  No incarcerated hernias. Ext:  SCDs BLE.  No significant edema.  No cyanosis Skin: No petechiae / purpurae   Disposition: Final discharge disposition not confirmed  Discharge Orders    Future Appointments: Provider: Department: Dept Phone: Center:   07/18/2011 3:45 PM Ardeth Sportsman, MD [facility] [phone]  None   08/23/2011 8:15 AM Edwyna Perfect, MD [facility] [phone] [facility]     Future Orders Please Complete By Expires   Diet - low sodium heart healthy      Increase activity slowly        Medication List  As of 07/01/2011  7:21 AM   TAKE these medications         atorvastatin 10 MG tablet   Commonly known as: LIPITOR   Take 1 tablet (10 mg total) by mouth daily. Office visit needed for refills      betamethasone valerate 0.1 % cream   Commonly known as: VALISONE   Apply 1 application topically 2 (two) times daily as needed.      cetirizine 10 MG tablet   Commonly known as: ZYRTEC   Take 10 mg by mouth daily.      fish oil-omega-3 fatty acids 1000 MG capsule   Take 1 g by mouth daily.      losartan 50 MG tablet   Commonly known as: COZAAR   Take 1 tablet (50 mg total) by mouth daily. Office visit needed for refills      oxyCODONE 5 MG immediate release tablet   Commonly known as: Oxy IR/ROXICODONE   Take 1-2 tablets (5-10 mg total) by mouth every 4 (four) hours as needed for pain.      Red Yeast Rice 600 MG Caps   Take 1 capsule by mouth daily.      vitamin C 500 MG tablet   Commonly known as: ASCORBIC ACID   Take 500 mg by mouth daily.           Follow-up Information    Schedule an appointment as soon as possible for a visit with PYRTLE,  Carie Caddy, MD.   Contact information:   [address] [place] [address] [phone]          Signed: Ardeth Sportsman. 07/01/2011, 7:21 AM

## 2011-07-01 NOTE — Progress Notes (Signed)
Discharge instructions and prescription provided. Patient verbalized understanding. Patient discharged to home. 

## 2011-07-06 ENCOUNTER — Telehealth (INDEPENDENT_AMBULATORY_CARE_PROVIDER_SITE_OTHER): Payer: Self-pay

## 2011-07-06 NOTE — Telephone Encounter (Signed)
Called pt to notify him that his path report is good showing no cancer and margins are clear per DR Gross. The pt needs to f/u with colonoscopy in one year with Dr Rhea Belton and the pt is already on a reminder list with [facility] to make the colonoscopy appt in one year.

## 2011-07-13 ENCOUNTER — Telehealth (INDEPENDENT_AMBULATORY_CARE_PROVIDER_SITE_OTHER): Payer: Self-pay | Admitting: General Surgery

## 2011-07-13 NOTE — Telephone Encounter (Signed)
Spoke with the patient and advised him to continue with his bowel regime and also warm tub soaks, pt told to call if bleeding becomes worse. Pt verbalized understanding.

## 2011-07-13 NOTE — Telephone Encounter (Signed)
The patient called the office stating he is having rectal bleeding with gas and some clear mucus. States his bowel movements are soft and formed and he is taking a daily regiment of metamucil. Dr Michaell Cowing suggests that the patient continue with his bowel regiment and use warm soaks in the tub.  Sometimes sutures will fall out and bleeding may occur it will need to heel by secondary intention, he will need to contact the office if the bleeding becomes worse.

## 2011-07-14 ENCOUNTER — Telehealth: Payer: Self-pay | Admitting: Pulmonary Disease

## 2011-07-14 NOTE — Telephone Encounter (Signed)
Download 6/13 on auto 12-15 >> acceptable residual events 6/h, good usage , no leak No changes

## 2011-07-15 NOTE — Telephone Encounter (Signed)
Pt returned the call to mindy---pt is aware of download results per RA.  Pt voiced his understanding of these results and nothing further needed.

## 2011-07-15 NOTE — Telephone Encounter (Signed)
lmomtcb x1 

## 2011-07-18 ENCOUNTER — Encounter (INDEPENDENT_AMBULATORY_CARE_PROVIDER_SITE_OTHER): Payer: Self-pay

## 2011-07-18 ENCOUNTER — Encounter (INDEPENDENT_AMBULATORY_CARE_PROVIDER_SITE_OTHER): Payer: Self-pay | Admitting: Surgery

## 2011-07-18 ENCOUNTER — Ambulatory Visit (INDEPENDENT_AMBULATORY_CARE_PROVIDER_SITE_OTHER): Payer: 59 | Admitting: Surgery

## 2011-07-18 VITALS — BP 120/88 | HR 84 | Temp 98.6°F | Resp 16 | Ht 69.0 in | Wt 199.0 lb

## 2011-07-18 DIAGNOSIS — D126 Benign neoplasm of colon, unspecified: Secondary | ICD-10-CM

## 2011-07-18 HISTORY — DX: Benign neoplasm of colon, unspecified: D12.6

## 2011-07-18 NOTE — Patient Instructions (Signed)
ANORECTAL SURGERY: POST OP INSTRUCTIONS  1. Take your usually prescribed home medications unless otherwise directed. 2. DIET: Follow a light bland diet the first 24 hours after arrival home, such as soup, liquids, crackers, etc.  Be sure to include lots of fluids daily.  Avoid fast food or heavy meals as your are more likely to get nauseated.  Eat a low fat the next few days after surgery.   3. PAIN CONTROL: a. Pain is best controlled by a usual combination of three different methods TOGETHER: i. Ice/Heat ii. Over the counter pain medication iii. Prescription pain medication b. Most patients will experience some swelling and discomfort in the anus/rectal area. and incisions.  Ice packs or heat (30-60 minutes up to 6 times a day) will help. Use ice for the first few days to help decrease swelling and bruising, then switch to heat such as warm towels, sitz baths, warm baths, etc to help relax tight/sore spots and speed recovery.  Some people prefer to use ice alone, heat alone, alternating between ice & heat.  Experiment to what works for you.  Swelling and bruising can take several weeks to resolve.   c. It is helpful to take an over-the-counter pain medication regularly for the first few weeks.  Choose one of the following that works best for you: i. Naproxen (Aleve, etc)  Two 220mg tabs twice a day ii. Ibuprofen (Advil, etc) Three 200mg tabs four times a day (every meal & bedtime) iii. Acetaminophen (Tylenol, etc) 500-650mg four times a day (every meal & bedtime) d. A  prescription for pain medication (such as oxycodone, hydrocodone, etc) should be given to you upon discharge.  Take your pain medication as prescribed.  i. If you are having problems/concerns with the prescription medicine (does not control pain, nausea, vomiting, rash, itching, etc), please call us [phone] to see if we need to switch you to a different pain medicine that will work better for you and/or control your side effect  better. ii. If you need a refill on your pain medication, please contact your pharmacy.  They will contact our office to request authorization. Prescriptions will not be filled after 5 pm or on week-ends. 4. KEEP YOUR BOWELS REGULAR a. The goal is one bowel movement a day b. Avoid getting constipated.  Between the surgery and the pain medications, it is common to experience some constipation.  Increasing fluid intake and taking a fiber supplement (such as Metamucil, Citrucel, FiberCon, MiraLax, etc) 1-2 times a day regularly will usually help prevent this problem from occurring.  A mild laxative (prune juice, Milk of Magnesia, MiraLax, etc) should be taken according to package directions if there are no bowel movements after 48 hours. c. Watch out for diarrhea.  If you have many loose bowel movements, simplify your diet to bland foods & liquids for a few days.  Stop any stool softeners and decrease your fiber supplement.  Switching to mild anti-diarrheal medications (Kayopectate, Pepto Bismol) can help.  If this worsens or does not improve, please call us.  5. Wound Care a. Remove your bandages the day after surgery.  Unless discharge instructions indicate otherwise, leave your bandage dry and in place overnight.  Remove the bandage during your first bowel movement.   b. Allow the wound packing to fall out over the next few days.  You can trim exposed gauze / ribbon as it falls out.  You do not need to repack the wound unless instructed otherwise.  Wear an   absorbent pad or soft cotton gauze in your underwear as needed to catch any drainage and help keep the area  c. Keep the area clean and dry.  Bathe / shower every day.  Keep the area clean by showering / bathing over the incision / wound.   It is okay to soak an open wound to help wash it.  Wet wipes or showers / gentle washing after bowel movements is often less traumatic than regular toilet paper. d. You may have some styrofoam-like soft packing in  the rectum which will come out with the first bowel movement.  e. You will often notice bleeding with bowel movements.  This should slow down by the end of the first week of surgery f. Expect some drainage.  This should slow down, too, by the end of the first week of surgery.  Wear an absorbent pad or soft cotton gauze in your underwear until the drainage stops. 6. ACTIVITIES as tolerated:   a. You may resume regular (light) daily activities beginning the next day--such as daily self-care, walking, climbing stairs--gradually increasing activities as tolerated.  If you can walk 30 minutes without difficulty, it is safe to try more intense activity such as jogging, treadmill, bicycling, low-impact aerobics, swimming, etc. b. Save the most intensive and strenuous activity for last such as sit-ups, heavy lifting, contact sports, etc  Refrain from any heavy lifting or straining until you are off narcotics for pain control.   c. DO NOT PUSH THROUGH PAIN.  Let pain be your guide: If it hurts to do something, don't do it.  Pain is your body warning you to avoid that activity for another week until the pain goes down. d. You may drive when you are no longer taking prescription pain medication, you can comfortably sit for long periods of time, and you can safely maneuver your car and apply brakes. e. You may have sexual intercourse when it is comfortable.  7. FOLLOW UP in our office a. Please call [facility] at [phone] to set up an appointment to see your surgeon in the office for a follow-up appointment approximately 2 weeks after your surgery. b. Make sure that you call for this appointment the day you arrive home to insure a convenient appointment time. 10. IF YOU HAVE DISABILITY OR FAMILY LEAVE FORMS, BRING THEM TO THE OFFICE FOR PROCESSING.  DO NOT GIVE THEM TO YOUR DOCTOR.        WHEN TO CALL US [phone]: 1. Poor pain control 2. Reactions / problems with new medications (rash/itching, nausea,  etc)  3. Fever over 101.5 F (38.5 C) 4. Inability to urinate 5. Nausea and/or vomiting 6. Worsening swelling or bruising 7. Continued bleeding from incision. 8. Increased pain, redness, or drainage from the incision  The clinic staff is available to answer your questions during regular business hours (8:30am-5pm).  Please don't hesitate to call and ask to speak to one of our nurses for clinical concerns.   A surgeon from [facility] is always on call at the hospitals   If you have a medical emergency, go to the nearest emergency room or call 911.    [facility] [address], [address], [place], [place]  [address] ? MAIN: [phone] ? TOLL FREE: [phone] ? FAX [phone] [email or web address]    

## 2011-07-18 NOTE — Progress Notes (Signed)
Subjective:     Patient ID: Richard Henderson, male   DOB: May 10, 1960, 51 y.o.   MRN: [id number]  HPI   Richard Henderson  [birth year] [id number]  Patient Care Team: Edwyna Perfect, MD as PCP - General (Internal Medicine) Beverley Fiedler, MD as Consulting Physician (Gastroenterology)  This patient is a 51 y.o.male who presents today for surgical evaluation.   Reason for visit: Rectal polyp removal by TEM partial proctectomy 06/30/2011  REPORT OF SURGICAL PATHOLOGY FINAL DIAGNOSIS Diagnosis Rectum, resection - TRADITIONAL SERRATED ADENOMA (4.5 CM); NEGATIVE FOR HIGH GRADE DYSPLASIA OR MALIGNANCY. SEE COMMENT. - DYSPLASIA IS 0.3 CM FROM NEAREST MARGIN (DISTAL). Microscopic Comment The entire polyp was submitted for review. The surgical resection margin(s) of the specimen were inked and microscopically evaluated. Traditional serrated adenomas are a variant of adenomatous polyp considered adequately treated with complete excision. The previous biopsy ([id number]) was reviewed. The morphologic features of those biopsies are most consistent with traditional serrated adenoma. (CRR:gt, 07/05/11)   Patient is a pleasant healthy male who was found to have numerous polyps by colonoscopy on screening. During colonoscopy, Dr. Rhea Belton found and removed numerous polyps.  However, he saw a 3 cm rectal polyp too large to safely remove in the distal rectum. Biopsy showed adenomatous changes. He was sent for consideration of excision.   My partner, Dr. Carolynne Edouard, cannot definitely feel a lesion. He was worried it may be more proximal & require TEM partial proctectomy. The patient wished to discuss that option with me so Dr. Carolynne Edouard referred the patient to me.  I performed partial proctectomy by TEM.  He stayed overnight.  Initially he did not have any pain.  Bowels move well.  Then began to have some discomfort and bleeding.  Was more severe for a few days 2 weeks ago but now is much better.  Some mild rectal fullness  but and urgency but getting better.  Soreness well-controlled with ibuprofen only.  Tylenol not enough.  Not bad enough for narcotics.  In good spirits.  Hoping to get back to work soon  Patient Active Problem List  Diagnosis  . ALLERGIC RHINITIS  . GERD  . BACK PAIN, THORACIC REGION, LEFT  . NEVI, MULTIPLE  . HYPERLIPIDEMIA  . HYPERTENSION  . SLEEP APNEA, OBSTRUCTIVE  . Left ankle swelling  . Colon cancer screening  . Serrated adenoma of rectum s/p TEM resection 27Jun2013    Past Medical History  Diagnosis Date  . Allergic rhinitis   . Hyperlipidemia   . Hypertension   . Colonic mass   . Eczema   . Sleep apnea     USES C-PAP    Past Surgical History  Procedure Date  . Wisdom tooth extraction 2012  . Transanal endoscopic microsurgery 06/30/2011    History   Social History  . Marital Status: Married    Spouse Name: N/A    Number of Children: 3  . Years of Education: N/A   Occupational History  . Application Systems Analyst    Social History Main Topics  . Smoking status: Never Smoker   . Smokeless tobacco: Never Used  . Alcohol Use: Yes     Rare  . Drug Use: No  . Sexually Active: Not on file   Other Topics Concern  . Not on file   Social History Narrative  . No narrative on file    Family History  Problem Relation Age of Onset  . Prostate cancer Father   . Alzheimer's disease Father   .  Cancer Father     prostate  . Arthritis Other     Current Outpatient Prescriptions  Medication Sig Dispense Refill  . acetaminophen (TYLENOL) 500 MG tablet Take 500 mg by mouth every 6 (six) hours as needed.      [name] atorvastatin (LIPITOR) 10 MG tablet Take 1 tablet (10 mg total) by mouth daily. Office visit needed for refills  90 tablet  1  . betamethasone valerate (VALISONE) 0.1 % cream Apply 1 application topically 2 (two) times daily as needed.  30 g  1  . cetirizine (ZYRTEC) 10 MG tablet Take 10 mg by mouth daily.       [name] ibuprofen (ADVIL,MOTRIN) 200 MG  tablet Take 200 mg by mouth every 6 (six) hours as needed.      [name] losartan (COZAAR) 50 MG tablet Take 1 tablet (50 mg total) by mouth daily. Office visit needed for refills  90 tablet  1  . fish oil-omega-3 fatty acids 1000 MG capsule Take 1 g by mouth daily.      . Red Yeast Rice 600 MG CAPS Take 1 capsule by mouth daily.      . vitamin C (ASCORBIC ACID) 500 MG tablet Take 500 mg by mouth daily.         No Known Allergies  BP 120/88  Pulse 84  Temp 98.6 F (37 C) (Temporal)  Resp 16  Ht 5\' 9"  (1.753 m)  Wt 199 lb (90.266 kg)  BMI 29.39 kg/m2  No results found.   Review of Systems  Constitutional: Negative for fever, chills and diaphoresis.  HENT: Negative for nosebleeds, sore throat, facial swelling, mouth sores, trouble swallowing and ear discharge.   Eyes: Negative for photophobia, discharge and visual disturbance.  Respiratory: Negative for choking, chest tightness, shortness of breath and stridor.   Cardiovascular: Negative for chest pain and palpitations.       Walks 3 miles day  Gastrointestinal: Positive for anal bleeding. Negative for nausea, vomiting, abdominal pain, diarrhea, constipation, blood in stool, abdominal distention and rectal pain.  Genitourinary: Negative for dysuria, urgency, difficulty urinating and testicular pain.  Musculoskeletal: Negative for myalgias, back pain, arthralgias and gait problem.  Skin: Negative for color change, pallor, rash and wound.  Neurological: Negative for dizziness, speech difficulty, weakness, numbness and headaches.  Hematological: Negative for adenopathy. Does not bruise/bleed easily.  Psychiatric/Behavioral: Negative for hallucinations, confusion and agitation.       Objective:   Physical Exam  Constitutional: He is oriented to person, place, and time. He appears well-developed and well-nourished. No distress.  HENT:  Head: Normocephalic.  Mouth/Throat: Oropharynx is clear and moist. No oropharyngeal exudate.  Eyes:  Conjunctivae and EOM are normal. Pupils are equal, round, and reactive to light. No scleral icterus.  Neck: Normal range of motion. Neck supple. No tracheal deviation present.  Cardiovascular: Normal rate, regular rhythm and intact distal pulses.   Pulmonary/Chest: Effort normal and breath sounds normal. No respiratory distress.  Abdominal: Soft. He exhibits no distension. There is no tenderness. There is no guarding. Hernia confirmed negative in the right inguinal area and confirmed negative in the left inguinal area.       Small umb hernia ~ 3mm  Genitourinary:       No rectal exam this visit    Musculoskeletal: Normal range of motion. He exhibits no tenderness.  Lymphadenopathy:    He has no cervical adenopathy.       Right: No inguinal adenopathy present.  Left: No inguinal adenopathy present.  Neurological: He is alert and oriented to person, place, and time. No cranial nerve deficit. He exhibits normal muscle tone. Coordination normal.  Skin: Skin is warm and dry. No rash noted. He is not diaphoretic. No erythema. No pallor.  Psychiatric: He has a normal mood and affect. His behavior is normal. Judgment and thought content normal.       Assessment:     Rectal polyp s/p removal    Plan:     Increase activity as tolerated.  Do not push through pain.  Advanced on diet as tolerated. Bowel regimen to avoid problems.  Return to clinic 1 month to make sure he continues to improve. The patient expressed understanding and appreciation  Surgical follow-up after rectal polyp resection: -Rectal exam 3 & 6 months first year after surgery -Colonoscopy at end of 1st year (& probable 4th year) -Rectal exam annually 2nd year

## 2011-07-20 ENCOUNTER — Encounter: Payer: Self-pay | Admitting: Pulmonary Disease

## 2011-08-08 ENCOUNTER — Encounter (INDEPENDENT_AMBULATORY_CARE_PROVIDER_SITE_OTHER): Payer: 59 | Admitting: Surgery

## 2011-08-23 ENCOUNTER — Ambulatory Visit: Payer: 59 | Admitting: Internal Medicine

## 2011-08-26 ENCOUNTER — Ambulatory Visit (INDEPENDENT_AMBULATORY_CARE_PROVIDER_SITE_OTHER): Payer: 59 | Admitting: Internal Medicine

## 2011-08-26 ENCOUNTER — Telehealth: Payer: Self-pay | Admitting: Internal Medicine

## 2011-08-26 ENCOUNTER — Encounter: Payer: Self-pay | Admitting: Internal Medicine

## 2011-08-26 VITALS — BP 122/74 | HR 75 | Temp 98.4°F | Resp 16 | Ht 68.0 in | Wt 208.2 lb

## 2011-08-26 DIAGNOSIS — E785 Hyperlipidemia, unspecified: Secondary | ICD-10-CM

## 2011-08-26 DIAGNOSIS — I10 Essential (primary) hypertension: Secondary | ICD-10-CM

## 2011-08-26 DIAGNOSIS — R739 Hyperglycemia, unspecified: Secondary | ICD-10-CM

## 2011-08-26 DIAGNOSIS — Z79899 Other long term (current) drug therapy: Secondary | ICD-10-CM

## 2011-08-26 MED ORDER — BETAMETHASONE DIPROPIONATE 0.05 % EX OINT: TOPICAL_OINTMENT | Freq: Two times a day (BID) | CUTANEOUS | Status: DC

## 2011-08-26 NOTE — Progress Notes (Signed)
  Subjective:    Patient ID: Richard Henderson, male    DOB: 06-04-1960, 51 y.o.   MRN: [id number]  HPI Pt presents to clinic for followup of multiple medical problems. S/jp colonoscopy with large polyp now sp resection. Benign pathology. Doing well without complaint.   Past Medical History  Diagnosis Date  . Allergic rhinitis   . Hyperlipidemia   . Hypertension   . Colonic mass   . Eczema   . Sleep apnea     USES C-PAP   Past Surgical History  Procedure Date  . Wisdom tooth extraction 2012  . Transanal endoscopic microsurgery 06/30/2011    reports that he has never smoked. He has never used smokeless tobacco. He reports that he drinks alcohol. He reports that he does not use illicit drugs. family history includes Alzheimer's disease in his father; Arthritis in his other; Cancer in his father; and Prostate cancer in his father. No Known Allergies    Review of Systems see hpi     Objective:   Physical Exam  Physical Exam  Nursing note and vitals reviewed. Constitutional: Appears well-developed and well-nourished. No distress.  HENT:  Head: Normocephalic and atraumatic.  Right Ear: External ear normal.  Left Ear: External ear normal.  Eyes: Conjunctivae are normal. No scleral icterus.  Neck: Neck supple. Carotid bruit is not present.  Cardiovascular: Normal rate, regular rhythm and normal heart sounds.  Exam reveals no gallop and no friction rub.   No murmur heard. Pulmonary/Chest: Effort normal and breath sounds normal. No respiratory distress. He has no wheezes. no rales.  Lymphadenopathy:    He has no cervical adenopathy.  Neurological:Alert.  Skin: Skin is warm and dry. Not diaphoretic.  Psychiatric: Has a normal mood and affect.       Assessment & Plan:

## 2011-08-26 NOTE — Telephone Encounter (Signed)
Labs entered... 08/26/11@4 :51pm/LMB

## 2011-08-26 NOTE — Telephone Encounter (Signed)
Please schedule fasting labs for next Tuesday morning  Lipid/lft-272.4 and a1c-hyperglycemia. Cancel cbc and chem7   Also schedule fasting labs prior to next visit  chem7-v58.69, cbc-401.9 and lipid/lft-272.4  Patient has upcoming appointment in February 2014. He will be going to [facility] lab

## 2011-08-26 NOTE — Patient Instructions (Signed)
Please schedule fasting labs for next Tuesday morning Lipid/lft-272.4 and a1c-hyperglycemia. Cancel cbc and chem7 Also schedule fasting labs prior to next visit chem7-v58.69, cbc-401.9 and lipid/lft-272.4

## 2011-08-28 NOTE — Assessment & Plan Note (Signed)
Obtain flp/lt

## 2011-08-28 NOTE — Assessment & Plan Note (Signed)
Normotensive and stable. Continue current regimen. Monitor bp as outpt and followup in clinic as scheduled. Normotensive and stable. Continue current regimen. Monitor bp as outpt and followup in clinic as scheduled.

## 2011-09-01 LAB — HEPATIC FUNCTION PANEL
ALT: 13 U/L (ref 0–53)
AST: 14 U/L (ref 0–37)
Albumin: 4.2 g/dL (ref 3.5–5.2)
Alkaline Phosphatase: 83 U/L (ref 39–117)
Bilirubin, Direct: 0.1 mg/dL (ref 0.0–0.3)
Indirect Bilirubin: 0.5 mg/dL (ref 0.0–0.9)
Total Bilirubin: 0.6 mg/dL (ref 0.3–1.2)
Total Protein: 6.6 g/dL (ref 6.0–8.3)

## 2011-09-01 LAB — CBC WITH DIFFERENTIAL/PLATELET
Basophils Absolute: 0 10*3/uL (ref 0.0–0.1)
Basophils Relative: 0 % (ref 0–1)
Eosinophils Absolute: 0.2 10*3/uL (ref 0.0–0.7)
Eosinophils Relative: 3 % (ref 0–5)
HCT: 44.8 % (ref 39.0–52.0)
Hemoglobin: 15.7 g/dL (ref 13.0–17.0)
Lymphocytes Relative: 29 % (ref 12–46)
Lymphs Abs: 1.8 10*3/uL (ref 0.7–4.0)
MCH: 30.1 pg (ref 26.0–34.0)
MCHC: 35 g/dL (ref 30.0–36.0)
MCV: 86 fL (ref 78.0–100.0)
Monocytes Absolute: 0.5 10*3/uL (ref 0.1–1.0)
Monocytes Relative: 8 % (ref 3–12)
Neutro Abs: 3.7 10*3/uL (ref 1.7–7.7)
Neutrophils Relative %: 60 % (ref 43–77)
Platelets: 227 10*3/uL (ref 150–400)
RBC: 5.21 MIL/uL (ref 4.22–5.81)
RDW: 14 % (ref 11.5–15.5)
WBC: 6.1 10*3/uL (ref 4.0–10.5)

## 2011-09-01 LAB — BASIC METABOLIC PANEL
BUN: 18 mg/dL (ref 6–23)
CO2: 31 mEq/L (ref 19–32)
Calcium: 9.3 mg/dL (ref 8.4–10.5)
Chloride: 104 mEq/L (ref 96–112)
Creat: 1.05 mg/dL (ref 0.50–1.35)
Glucose, Bld: 110 mg/dL — ABNORMAL HIGH (ref 70–99)
Potassium: 4.6 mEq/L (ref 3.5–5.3)
Sodium: 140 mEq/L (ref 135–145)

## 2011-09-01 LAB — LIPID PANEL
Cholesterol: 198 mg/dL (ref 0–200)
HDL: 41 mg/dL (ref >39)
LDL Cholesterol: 134 mg/dL — ABNORMAL HIGH (ref 0–99)
Total CHOL/HDL Ratio: 4.8 Ratio
Triglycerides: 116 mg/dL (ref <150)
VLDL: 23 mg/dL (ref 0–40)

## 2011-09-01 LAB — HEMOGLOBIN A1C
Hgb A1c MFr Bld: 5.6 % (ref <5.7)
Mean Plasma Glucose: 114 mg/dL (ref <117)

## 2011-09-01 NOTE — Telephone Encounter (Signed)
Pt presented to the lab, future orders released. 

## 2011-09-01 NOTE — Addendum Note (Signed)
Addended by: Mervin Kung A on: 09/01/2011 03:24 PM   Modules accepted: Orders

## 2011-09-09 ENCOUNTER — Encounter (INDEPENDENT_AMBULATORY_CARE_PROVIDER_SITE_OTHER): Payer: Self-pay | Admitting: Surgery

## 2011-09-09 ENCOUNTER — Ambulatory Visit (INDEPENDENT_AMBULATORY_CARE_PROVIDER_SITE_OTHER): Payer: 59 | Admitting: Surgery

## 2011-09-09 VITALS — BP 122/80 | HR 84 | Temp 97.3°F | Resp 18 | Ht 69.0 in | Wt 206.8 lb

## 2011-09-09 DIAGNOSIS — D126 Benign neoplasm of colon, unspecified: Secondary | ICD-10-CM

## 2011-09-09 NOTE — Progress Notes (Signed)
Subjective:     Patient ID: Richard Henderson, male   DOB: Jun 07, 1960, 51 y.o.   MRN: [id number]  HPI  KARIN GRIFFITH  May 23, 1960 [id number]  Patient Care Team: Edwyna Perfect, MD as PCP - General (Internal Medicine) Beverley Fiedler, MD as Consulting Physician (Gastroenterology)  This patient is a 51 y.o.male who presents today for surgical evaluation s/p TEM partial proctectomy for a large serrated adenoma.   FINAL DIAGNOSIS Diagnosis Rectum, resection - TRADITIONAL SERRATED ADENOMA (4.5 CM); NEGATIVE FOR HIGH GRADE DYSPLASIA OR MALIGNANCY. SEE COMMENT. - DYSPLASIA IS 0.3 CM FROM NEAREST MARGIN (DISTAL).  Patient comes in today feeling okay.  Then 4-6 bowel movements a day.  Had to travel to [place] to put his mom in hospice.  Had to travel to [place] for another issue.  Total lot of discomfort after those things.  Back to work.  Sensitivity little better.  No rectal bleeding.  No fevers or chills.  Had a hard bowel movement she was a little bleeding but that's gone now.  No rectal bleeding now.  No burning or itching.  In good spirits.  Patient Active Problem List  Diagnosis  . ALLERGIC RHINITIS  . GERD  . BACK PAIN, THORACIC REGION, LEFT  . NEVI, MULTIPLE  . HYPERLIPIDEMIA  . HYPERTENSION  . SLEEP APNEA, OBSTRUCTIVE  . Left ankle swelling  . Colon cancer screening  . Serrated adenoma of rectum s/p TEM resection 27Jun2013    Past Medical History  Diagnosis Date  . Allergic rhinitis   . Hyperlipidemia   . Hypertension   . Colonic mass   . Eczema   . Sleep apnea     USES C-PAP    Past Surgical History  Procedure Date  . Wisdom tooth extraction 2012  . Transanal endoscopic microsurgery 06/30/2011    History   Social History  . Marital Status: Married    Spouse Name: N/A    Number of Children: 3  . Years of Education: N/A   Occupational History  . Application Systems Analyst    Social History Main Topics  . Smoking status: Never Smoker   . Smokeless tobacco:  Never Used  . Alcohol Use: Yes     Rare  . Drug Use: No  . Sexually Active: Not on file   Other Topics Concern  . Not on file   Social History Narrative  . No narrative on file    Family History  Problem Relation Age of Onset  . Prostate cancer Father   . Alzheimer's disease Father   . Cancer Father     prostate  . Arthritis Other     Current Outpatient Prescriptions  Medication Sig Dispense Refill  . acetaminophen (TYLENOL) 500 MG tablet Take 500 mg by mouth every 6 (six) hours as needed.      [name] atorvastatin (LIPITOR) 10 MG tablet Take 1 tablet (10 mg total) by mouth daily. Office visit needed for refills  90 tablet  1  . betamethasone dipropionate (DIPROLENE) 0.05 % ointment Apply topically 2 (two) times daily.  60 g  4  . cetirizine (ZYRTEC) 10 MG tablet Take 10 mg by mouth daily.       . fish oil-omega-3 fatty acids 1000 MG capsule Take 1 g by mouth daily.      [name] losartan (COZAAR) 50 MG tablet Take 1 tablet (50 mg total) by mouth daily. Office visit needed for refills  90 tablet  1  . Multiple Vitamins-Minerals (  CENTRUM ULTRA MENS) TABS Take 1 tablet by mouth daily.      [name] ibuprofen (ADVIL,MOTRIN) 200 MG tablet Take 200 mg by mouth every 6 (six) hours as needed.      . Red Yeast Rice 600 MG CAPS Take 1 capsule by mouth daily.      . vitamin C (ASCORBIC ACID) 500 MG tablet Take 500 mg by mouth daily.         No Known Allergies  BP 122/80  Pulse 84  Temp 97.3 F (36.3 C) (Temporal)  Resp 18  Ht 5\' 9"  (1.753 m)  Wt 206 lb 12.8 oz (93.804 kg)  BMI 30.54 kg/m2  No results found.   Review of Systems  Constitutional: Negative for fever, chills and diaphoresis.  HENT: Negative for sore throat, trouble swallowing and neck pain.   Eyes: Negative for photophobia and visual disturbance.  Respiratory: Negative for choking and shortness of breath.   Cardiovascular: Negative for chest pain and palpitations.  Gastrointestinal: Negative for nausea, vomiting, abdominal  distention, anal bleeding and rectal pain.  Genitourinary: Negative for dysuria, urgency, difficulty urinating and testicular pain.  Musculoskeletal: Negative for myalgias, arthralgias and gait problem.  Skin: Negative for color change and rash.  Neurological: Negative for dizziness, speech difficulty, weakness and numbness.  Hematological: Negative for adenopathy.  Psychiatric/Behavioral: Negative for hallucinations, confusion and agitation.       Objective:   Physical Exam  Constitutional: He is oriented to person, place, and time. He appears well-developed and well-nourished. No distress.  HENT:  Head: Normocephalic.  Mouth/Throat: Oropharynx is clear and moist. No oropharyngeal exudate.  Eyes: Conjunctivae and EOM are normal. Pupils are equal, round, and reactive to light. No scleral icterus.  Neck: Normal range of motion. No tracheal deviation present.  Cardiovascular: Normal rate, normal heart sounds and intact distal pulses.   Pulmonary/Chest: Effort normal. No respiratory distress.  Abdominal: Soft. He exhibits no distension. There is no tenderness. Hernia confirmed negative in the right inguinal area and confirmed negative in the left inguinal area.       Incisions clean with normal healing ridges.  No hernias  Genitourinary:       Exam done with assistance of male Medical Assistant in the room.  Perianal skin clean with good hygiene.  No pruritis.  No external skin tags / hemorrhoids of significance.  No pilonidal disease.  No fissure.  No abscess/fistula.    Tolerates digital rectal exam.  Normal sphincter tone.   No rectal masses.  Suture line anterior circumference smooth - no ulceration.   Musculoskeletal: Normal range of motion. He exhibits no tenderness.  Neurological: He is alert and oriented to person, place, and time. No cranial nerve deficit. He exhibits normal muscle tone. Coordination normal.  Skin: Skin is warm and dry. No rash noted. He is not diaphoretic.    Psychiatric: He has a normal mood and affect. His behavior is normal.       Assessment:     2.5 months status post partial proctectomy for a large adenoma, recovering better    Plan:     Increase activity as tolerated.  Do not push through pain.  Advanced on diet as tolerated. Bowel regimen to avoid problems & slow things down  Return to clinic 3 months for follow up. Colonoscopy in one year.  The patient expressed understanding and appreciation

## 2011-09-09 NOTE — Patient Instructions (Addendum)
GETTING TO GOOD BOWEL HEALTH. Irregular bowel habits such as constipation and diarrhea can lead to many problems over time.  Having one soft bowel movement a day is the most important way to prevent further problems.  The anorectal canal is designed to handle stretching and feces to safely manage our ability to get rid of solid waste (feces, poop, stool) out of our body.  BUT, hard constipated stools can act like ripping concrete bricks and diarrhea can be a burning fire to this very sensitive area of our body, causing inflamed hemorrhoids, anal fissures, increasing risk is perirectal abscesses, abdominal pain/bloating, an making irritable bowel worse.     The goal: ONE SOFT BOWEL MOVEMENT A DAY!  To have soft, regular bowel movements:    Drink at least 8 tall glasses of water a day.     Take plenty of fiber.  Fiber is the undigested part of plant food that passes into the colon, acting s "natures broom" to encourage bowel motility and movement.  Fiber can absorb and hold large amounts of water. This results in a larger, bulkier stool, which is soft and easier to pass. Work gradually over several weeks up to 6 servings a day of fiber (25g a day even more if needed) in the form of: o Vegetables -- Root (potatoes, carrots, turnips), leafy green (lettuce, salad greens, celery, spinach), or cooked high residue (cabbage, broccoli, etc) o Fruit -- Fresh (unpeeled skin & pulp), Dried (prunes, apricots, cherries, etc ),  or stewed ( applesauce)  o Whole grain breads, pasta, etc (whole wheat)  o Bran cereals    Bulking Agents -- This type of water-retaining fiber generally is easily obtained each day by one of the following:  o Psyllium bran -- The psyllium plant is remarkable because its ground seeds can retain so much water. This product is available as Metamucil, Konsyl, Effersyllium, Per Diem Fiber, or the less expensive generic preparation in drug and health food stores. Although labeled a laxative, it really  is not a laxative.  o Methylcellulose -- This is another fiber derived from wood which also retains water. It is available as Citrucel. o Polyethylene Glycol - and "artificial" fiber commonly called Miralax or Glycolax.  It is helpful for people with gassy or bloated feelings with regular fiber o Flax Seed - a less gassy fiber than psyllium   No reading or other relaxing activity while on the toilet. If bowel movements take longer than 5 minutes, you are too constipated   AVOID CONSTIPATION.  High fiber and water intake usually takes care of this.  Sometimes a laxative is needed to stimulate more frequent bowel movements, but    Laxatives are not a good long-term solution as it can wear the colon out. o Osmotics (Milk of Magnesia, Fleets phosphosoda, Magnesium citrate, MiraLax, GoLytely) are safer than  o Stimulants (Senokot, Castor Oil, Dulcolax, Ex Lax)    o Do not take laxatives for more than 7days in a row.    IF SEVERELY CONSTIPATED, try a Bowel Retraining Program: o Do not use laxatives.  o Eat a diet high in roughage, such as bran cereals and leafy vegetables.  o Drink six (6) ounces of prune or apricot juice each morning.  o Eat two (2) large servings of stewed fruit each day.  o Take one (1) heaping tablespoon of a psyllium-based bulking agent twice a day. Use sugar-free sweetener when possible to avoid excessive calories.  o Eat a normal breakfast.  o   Set aside 15 minutes after breakfast to sit on the toilet, but do not strain to have a bowel movement.  o If you do not have a bowel movement by the third day, use an enema and repeat the above steps.    Controlling diarrhea o Switch to liquids and simpler foods for a few days to avoid stressing your intestines further. o Avoid dairy products (especially milk & ice cream) for a short time.  The intestines often can lose the ability to digest lactose when stressed. o Avoid foods that cause gassiness or bloating.  Typical foods include  beans and other legumes, cabbage, broccoli, and dairy foods.  Every person has some sensitivity to other foods, so listen to our body and avoid those foods that trigger problems for you. o Adding fiber (Citrucel, Metamucil, psyllium, Miralax) gradually can help thicken stools by absorbing excess fluid and retrain the intestines to act more normally.  Slowly increase the dose over a few weeks.  Too much fiber too soon can backfire and cause cramping & bloating. o Probiotics (such as active yogurt, Align, etc) may help repopulate the intestines and colon with normal bacteria and calm down a sensitive digestive tract.  Most studies show it to be of mild help, though, and such products can be costly. o Medicines:   Bismuth subsalicylate (ex. Kayopectate, Pepto Bismol) every 30 minutes for up to 6 doses can help control diarrhea.  Avoid if pregnant.   Loperamide (Immodium) can slow down diarrhea.  Start with two tablets (4mg  total) first and then try one tablet every 6 hours.  Avoid if you are having fevers or severe pain.  If you are not better or start feeling worse, stop all medicines and call your doctor for advice o Call your doctor if you are getting worse or not better.  Sometimes further testing (cultures, endoscopy, X-ray studies, bloodwork, etc) may be needed to help diagnose and treat the cause of the diarrhea.  Colon Polyps A polyp is extra tissue that grows inside your body. Colon polyps grow in the large intestine. The large intestine, also called the colon, is part of your digestive system. It is a long, hollow tube at the end of your digestive tract where your body makes and stores stool. Most polyps are not dangerous. They are benign. This means they are not cancerous. But over time, some types of polyps can turn into cancer. Polyps that are smaller than a pea are usually not harmful. But larger polyps could someday become or may already be cancerous. To be safe, doctors remove all polyps and  test them.  WHO GETS POLYPS? Anyone can get polyps, but certain people are more likely than others. You may have a greater chance of getting polyps if:  You are over 50.   You have had polyps before.   Someone in your family has had polyps.   Someone in your family has had cancer of the large intestine.   Find out if someone in your family has had polyps. You may also be more likely to get polyps if you:   Eat a lot of fatty foods.   Smoke.   Drink alcohol.   Do not exercise.   Eat too much.  SYMPTOMS  Most small polyps do not cause symptoms. People often do not know they have one until their caregiver finds it during a regular checkup or while testing them for something else. Some people do have symptoms like these:  Bleeding from  the anus. You might notice blood on your underwear or on toilet paper after you have had a bowel movement.   Constipation or diarrhea that lasts more than a week.   Blood in the stool. Blood can make stool look black or it can show up as red streaks in the stool.  If you have any of these symptoms, see your caregiver. HOW DOES THE DOCTOR TEST FOR POLYPS? The doctor can use four tests to check for polyps:  Digital rectal exam. The caregiver wears gloves and checks your rectum (the last part of the large intestine) to see if it feels normal. This test would find polyps only in the rectum. Your caregiver may need to do one of the other tests listed below to find polyps higher up in the intestine.   Barium enema. The caregiver puts a liquid called barium into your rectum before taking x-rays of your large intestine. Barium makes your intestine look white in the pictures. Polyps are dark, so they are easy to see.   Sigmoidoscopy. With this test, the caregiver can see inside your large intestine. A thin flexible tube is placed into your rectum. The device is called a sigmoidoscope, which has a light and a tiny video camera in it. The caregiver uses the  sigmoidoscope to look at the last third of your large intestine.   Colonoscopy. This test is like sigmoidoscopy, but the caregiver looks at all of the large intestine. It usually requires sedation. This is the most common method for finding and removing polyps.  TREATMENT   The caregiver will remove the polyp during sigmoidoscopy or colonoscopy. The polyp is then tested for cancer.   If you have had polyps, your caregiver may want you to get tested regularly in the future.  PREVENTION  There is not one sure way to prevent polyps. You might be able to lower your risk of getting them if you:  Eat more fruits and vegetables and less fatty food.   Do not smoke.   Avoid alcohol.   Exercise every day.   Lose weight if you are overweight.   Eating more calcium and folate can also lower your risk of getting polyps. Some foods that are rich in calcium are milk, cheese, and broccoli. Some foods that are rich in folate are chickpeas, kidney beans, and spinach.   Aspirin might help prevent polyps. Studies are under way.  Document Released: 09/16/2003 Document Revised: 12/09/2010 Document Reviewed: 02/21/2007 [facility] Patient Information 2012 [place], [place].

## 2011-09-20 ENCOUNTER — Other Ambulatory Visit: Payer: Self-pay | Admitting: Internal Medicine

## 2011-09-20 NOTE — Telephone Encounter (Signed)
Rx[s] done/SLS 

## 2012-02-28 ENCOUNTER — Ambulatory Visit: Payer: 59 | Admitting: Internal Medicine

## 2012-02-28 ENCOUNTER — Encounter: Payer: 59 | Admitting: Family

## 2012-03-06 ENCOUNTER — Ambulatory Visit: Payer: 59 | Admitting: Family

## 2012-03-13 ENCOUNTER — Encounter: Payer: 59 | Admitting: Family

## 2012-03-20 ENCOUNTER — Encounter: Payer: Self-pay | Admitting: Gastroenterology

## 2012-03-27 ENCOUNTER — Encounter: Payer: Self-pay | Admitting: Family

## 2012-03-27 ENCOUNTER — Ambulatory Visit (INDEPENDENT_AMBULATORY_CARE_PROVIDER_SITE_OTHER): Payer: 59 | Admitting: Family

## 2012-03-27 ENCOUNTER — Telehealth: Payer: Self-pay | Admitting: Family

## 2012-03-27 VITALS — BP 118/82 | HR 66 | Temp 98.1°F | Resp 16 | Ht 69.5 in | Wt 210.0 lb

## 2012-03-27 DIAGNOSIS — Z23 Encounter for immunization: Secondary | ICD-10-CM

## 2012-03-27 DIAGNOSIS — K620 Anal polyp: Secondary | ICD-10-CM

## 2012-03-27 DIAGNOSIS — Z0000 Encounter for general adult medical examination without abnormal findings: Secondary | ICD-10-CM

## 2012-03-27 DIAGNOSIS — N529 Male erectile dysfunction, unspecified: Secondary | ICD-10-CM | POA: Insufficient documentation

## 2012-03-27 DIAGNOSIS — K621 Rectal polyp: Secondary | ICD-10-CM | POA: Insufficient documentation

## 2012-03-27 LAB — BASIC METABOLIC PANEL WITH GFR
BUN: 17 mg/dL (ref 6–23)
CO2: 31 mEq/L (ref 19–32)
Calcium: 9.8 mg/dL (ref 8.4–10.5)
Chloride: 102 mEq/L (ref 96–112)
Creat: 1.09 mg/dL (ref 0.50–1.35)
GFR, Est African American: >89 mL/min
GFR, Est Non African American: 78 mL/min
Glucose, Bld: 112 mg/dL — ABNORMAL HIGH (ref 70–99)
Potassium: 5.1 mEq/L (ref 3.5–5.3)
Sodium: 141 mEq/L (ref 135–145)

## 2012-03-27 LAB — HEPATIC FUNCTION PANEL
ALT: 17 U/L (ref 0–53)
AST: 19 U/L (ref 0–37)
Albumin: 4.7 g/dL (ref 3.5–5.2)
Alkaline Phosphatase: 84 U/L (ref 39–117)
Bilirubin, Direct: 0.1 mg/dL (ref 0.0–0.3)
Indirect Bilirubin: 0.6 mg/dL (ref 0.0–0.9)
Total Bilirubin: 0.7 mg/dL (ref 0.3–1.2)
Total Protein: 7 g/dL (ref 6.0–8.3)

## 2012-03-27 LAB — CBC WITH DIFFERENTIAL/PLATELET
Basophils Absolute: 0 10*3/uL (ref 0.0–0.1)
Basophils Relative: 0 % (ref 0–1)
Eosinophils Absolute: 0.1 10*3/uL (ref 0.0–0.7)
Eosinophils Relative: 2 % (ref 0–5)
HCT: 47.5 % (ref 39.0–52.0)
Hemoglobin: 16.7 g/dL (ref 13.0–17.0)
Lymphocytes Relative: 26 % (ref 12–46)
Lymphs Abs: 1.5 10*3/uL (ref 0.7–4.0)
MCH: 30.4 pg (ref 26.0–34.0)
MCHC: 35.2 g/dL (ref 30.0–36.0)
MCV: 86.5 fL (ref 78.0–100.0)
Monocytes Absolute: 0.5 10*3/uL (ref 0.1–1.0)
Monocytes Relative: 8 % (ref 3–12)
Neutro Abs: 3.7 10*3/uL (ref 1.7–7.7)
Neutrophils Relative %: 64 % (ref 43–77)
Platelets: 222 10*3/uL (ref 150–400)
RBC: 5.49 MIL/uL (ref 4.22–5.81)
RDW: 13.7 % (ref 11.5–15.5)
WBC: 5.8 10*3/uL (ref 4.0–10.5)

## 2012-03-27 LAB — LIPID PANEL
Cholesterol: 193 mg/dL (ref 0–200)
HDL: 42 mg/dL (ref >39)
LDL Cholesterol: 127 mg/dL — ABNORMAL HIGH (ref 0–99)
Total CHOL/HDL Ratio: 4.6 Ratio
Triglycerides: 121 mg/dL (ref <150)
VLDL: 24 mg/dL (ref 0–40)

## 2012-03-27 LAB — TSH: TSH: 2.889 u[IU]/mL (ref 0.350–4.500)

## 2012-03-27 MED ORDER — SILDENAFIL CITRATE 50 MG PO TABS: 50.0000 mg | ORAL_TABLET | Freq: Every day | ORAL | Status: DC | PRN

## 2012-03-27 NOTE — Assessment & Plan Note (Signed)
Reviewed with Dr. Rhea Belton (see phone note).  He schedule earlier colo.

## 2012-03-27 NOTE — Telephone Encounter (Signed)
Melissa, Thanks for the note.  He is due for surveillance colon in June and based on your findings at rectal exam, I feel it best to proceed to colonoscopy 2 months earlier to ensure complete resection of the large tubulovillous polyp. We will contact him. Thanks  Aram Beecham, Pt with hx of large tubulovillous rectal adenoma which was removed by transanal excision in June 2013.   He was set for a full recall colonoscopy in June 2014, but based on Dr. Arvil Chaco rectal exam, I feel that we should proceed to colonoscopy now. Please schedule previsit to discuss this procedure and schedule him for colonoscopy with moviprep.  If the patient feels necessary he can see me first in the office, otherwise direct procedure

## 2012-03-27 NOTE — Assessment & Plan Note (Signed)
We discussed healthy diet, exercise, weight loss.  Check fasting labs today including PSA (discussed pros and cons)

## 2012-03-27 NOTE — Telephone Encounter (Signed)
Noted. Thanks.

## 2012-03-27 NOTE — Telephone Encounter (Signed)
Spoke with pt who will come for PV tomorrow and COLON 04/05/12.

## 2012-03-27 NOTE — Assessment & Plan Note (Signed)
Check testosterone. Trial of viagra.

## 2012-03-27 NOTE — Telephone Encounter (Signed)
Dr. Rhea Belton,   I saw Richard Henderson today in the office.  On rectal exam, I noted a small polyp in the rectum.  It seems to correspond with the polyp that you noted on colonoscopy, however I see that this was resected.  Not certain if this is new or if it represents scar tissue from the polyp that was removed.  I wanted to make you aware in case you wanted to see him back for earlier follow up.  Thanks,  [employer]

## 2012-03-27 NOTE — Patient Instructions (Addendum)
Please complete your blood work prior to leaving.  Follow up in 6 months, sooner if problems or concerns.

## 2012-03-27 NOTE — Progress Notes (Signed)
Subjective:    Patient ID: Richard Henderson, male    DOB: [birth year], 52 y.o.   MRN: [id number]  HPI  Mr. Richard Henderson is a 52 yr old male who presents today for cpx.  Patient presents today for complete physical.  Immunizations: ? Last tetanus Diet: Reports diet has not been very good. Exercise: Needs more exercise. Colonoscopy: up to date.  He complains about ED which has been a problem for the last 2 years. Reports libido is "not like it used to be."     Review of Systems  Constitutional: Negative for unexpected weight change.  HENT: Positive for hearing loss. Negative for congestion.   Eyes:       Wears reading glasses  Respiratory: Negative for cough.   Cardiovascular: Negative for chest pain.  Gastrointestinal: Negative for nausea, vomiting and diarrhea.  Genitourinary: Negative for dysuria and frequency.  Musculoskeletal: Negative for joint swelling and arthralgias.  Skin: Negative for rash.  Hematological: Negative for adenopathy.  Psychiatric/Behavioral:       Denies depression or anxiety   Past Medical History  Diagnosis Date  . Allergic rhinitis   . Hyperlipidemia   . Hypertension   . Colonic mass   . Eczema   . Sleep apnea     USES C-PAP    History   Social History  . Marital Status: Married    Spouse Name: Richard Henderson    Number of Children: 3  . Years of Education: Richard Henderson   Occupational History  . Application Systems Analyst    Social History Main Topics  . Smoking status: Never Smoker   . Smokeless tobacco: Never Used  . Alcohol Use: Yes     Comment: Rare  . Drug Use: No  . Sexually Active: Not on file   Other Topics Concern  . 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    Past Surgical History  Procedure Laterality Date  . Wisdom tooth extraction  2012  . Transanal endoscopic microsurgery  06/30/2011    Family History  Problem Relation Age of  Onset  . Prostate cancer Father   . Alzheimer's disease Father   . Arthritis Other     No Known Allergies  Current Outpatient Prescriptions on File Prior to Visit  Medication Sig Dispense Refill  . atorvastatin (LIPITOR) 10 MG tablet TAKE 1 TABLET BY MOUTH DAILY OFFICE VISIT NEEDED FOR REFILLS  90 tablet  1  . betamethasone dipropionate (DIPROLENE) 0.05 % ointment Apply topically 2 (two) times daily.  60 g  4  . cetirizine (ZYRTEC) 10 MG tablet Take 10 mg by mouth daily.       Richard Henderson losartan (COZAAR) 50 MG tablet TAKE 1 TABLET BY MOUTH DAILY OFFICE VISIT NEEDED FOR REFILLS  90 tablet  1  . acetaminophen (TYLENOL) 500 MG tablet Take 500 mg by mouth every 6 (six) hours as needed.      . fish oil-omega-3 fatty acids 1000 MG capsule Take 1 g by mouth daily.      Richard Henderson ibuprofen (ADVIL,MOTRIN) 200 MG tablet Take 200 mg by mouth every 6 (six) hours as needed.      . Multiple Vitamins-Minerals (CENTRUM ULTRA MENS) TABS Take 1 tablet by mouth daily.      . Red Yeast Rice 600 MG CAPS Take 1 capsule by mouth daily.      Richard Henderson  vitamin C (ASCORBIC ACID) 500 MG tablet Take 500 mg by mouth daily.       No current facility-administered medications on file prior to visit.    BP 118/82  Pulse 66  Temp(Src) 98.1 F (36.7 C) (Oral)  Resp 16  Ht 5' 9.5" (1.765 m)  Wt 210 lb (95.255 kg)  BMI 30.58 kg/m2  SpO2 99%        Objective:   Physical Exam Physical Exam  Constitutional: He is oriented to person, place, and time. He appears well-developed and well-nourished. No distress.  HENT:  Head: Normocephalic and atraumatic.  Right Ear: Tympanic membrane and ear canal normal.  Left Ear: Tympanic membrane and ear canal normal.  Mouth/Throat: Oropharynx is clear and moist.  Eyes: Pupils are equal, round, and reactive to light. No scleral icterus.  Neck: Normal range of motion. No thyromegaly present.  Cardiovascular: Normal rate and regular rhythm.   No murmur heard. Pulmonary/Chest: Effort normal and  breath sounds normal. No respiratory distress. He has no wheezes. He has no rales. He exhibits no tenderness.  Abdominal: Soft. Bowel sounds are normal. He exhibits no distension and no mass. There is no tenderness. There is no rebound and no guarding.  Musculoskeletal: He exhibits no edema.  Lymphadenopathy:    He has no cervical adenopathy.  Neurological: He is alert and oriented to person, place, and time. He has normal reflexes. He exhibits normal muscle tone. Coordination normal.  Skin: Skin is warm and dry.  Psychiatric: He has a normal mood and affect. His behavior is normal. Judgment and thought content normal.  Rectal- prostate exam is normal.  Small polyp palpated in rectum.          Assessment & Plan:          Assessment & Plan:

## 2012-03-28 ENCOUNTER — Encounter: Payer: Self-pay | Admitting: Family

## 2012-03-28 LAB — URINALYSIS, ROUTINE W REFLEX MICROSCOPIC
Bilirubin Urine: NEGATIVE
Glucose, UA: NEGATIVE mg/dL
Hgb urine dipstick: NEGATIVE
Ketones, ur: NEGATIVE mg/dL
Leukocytes, UA: NEGATIVE
Nitrite: NEGATIVE
Protein, ur: NEGATIVE mg/dL
Specific Gravity, Urine: 1.024 (ref 1.005–1.030)
Urobilinogen, UA: 0.2 mg/dL (ref 0.0–1.0)
pH: 7.5 (ref 5.0–8.0)

## 2012-03-28 LAB — TESTOSTERONE, FREE, TOTAL, SHBG
Sex Hormone Binding: 25 nmol/L (ref 13–71)
Testosterone, Free: 80.7 pg/mL (ref 47.0–244.0)
Testosterone-% Free: 2.3 % (ref 1.6–2.9)
Testosterone: 347 ng/dL (ref 300–890)

## 2012-03-28 LAB — PSA: PSA: 0.94 ng/mL (ref <=4.00)

## 2012-03-29 ENCOUNTER — Ambulatory Visit (AMBULATORY_SURGERY_CENTER): Payer: 59

## 2012-03-29 VITALS — Ht 69.0 in | Wt 210.6 lb

## 2012-03-29 DIAGNOSIS — Z86010 Personal history of colonic polyps: Secondary | ICD-10-CM

## 2012-03-29 DIAGNOSIS — Z1211 Encounter for screening for malignant neoplasm of colon: Secondary | ICD-10-CM

## 2012-03-29 MED ORDER — MOVIPREP 100 G PO SOLR: ORAL | Status: DC

## 2012-03-30 ENCOUNTER — Encounter: Payer: Self-pay | Admitting: Internal Medicine

## 2012-04-05 ENCOUNTER — Ambulatory Visit (AMBULATORY_SURGERY_CENTER): Payer: 59 | Admitting: Internal Medicine

## 2012-04-05 ENCOUNTER — Encounter: Payer: Self-pay | Admitting: Internal Medicine

## 2012-04-05 VITALS — BP 132/81 | HR 64 | Temp 97.8°F | Resp 19 | Ht 69.0 in | Wt 210.0 lb

## 2012-04-05 DIAGNOSIS — D126 Benign neoplasm of colon, unspecified: Secondary | ICD-10-CM

## 2012-04-05 DIAGNOSIS — Z86010 Personal history of colonic polyps: Secondary | ICD-10-CM

## 2012-04-05 DIAGNOSIS — Z1211 Encounter for screening for malignant neoplasm of colon: Secondary | ICD-10-CM

## 2012-04-05 MED ORDER — SODIUM CHLORIDE 0.9 % IV SOLN: 500.0000 mL | INTRAVENOUS | Status: DC

## 2012-04-05 NOTE — Progress Notes (Signed)
Patient did not experience any of the following events: a burn prior to discharge; a fall within the facility; wrong site/side/patient/procedure/implant event; or a hospital transfer or hospital admission upon discharge from the facility. (G8907) Patient did not have preoperative order for IV antibiotic SSI prophylaxis. (G8918)  

## 2012-04-05 NOTE — Patient Instructions (Addendum)

## 2012-04-05 NOTE — Op Note (Signed)
[facility] [address] [place], [address]   COLONOSCOPY PROCEDURE REPORT  PATIENT: Richard Henderson, Richard Henderson  MR#: [id number] BIRTHDATE: 16-Jun-1960 , 51  yrs. old GENDER: Male ENDOSCOPIST: Beverley Fiedler, MD PROCEDURE DATE:  04/05/2012 PROCEDURE:   Colonoscopy with cold biopsy polypectomy and Colonoscopy with biopsy ASA CLASS:   Class II INDICATIONS:Patient's personal history of adenomatous colon polyps.  MEDICATIONS: MAC sedation, administered by CRNA and propofol (Diprivan) 300mg  IV  DESCRIPTION OF PROCEDURE:   After the risks benefits and alternatives of the procedure were thoroughly explained, informed consent was obtained.  A digital rectal exam revealed no polyp-like nodule.   The LB [id number]  endoscope was introduced through the anus and advanced to the cecum, which was identified by both the appendix and ileocecal valve. No adverse events experienced.   The quality of the prep was good, using MoviPrep The instrument was then slowly withdrawn as the colon was fully examined.   COLON FINDINGS: A sessile polyp measuring 3 mm in size was found at the hepatic flexure.  A polypectomy was performed with cold forceps.  The resection was complete and the polyp tissue was completely retrieved.   There was very mild scattered diverticulosis noted in the sigmoid colon.   There was evidence of prior intervention in the rectum.  Along the surgical scar, very close to the dentate line there was a polypoid lesion measuring approximately 5 mm.  Multiple biopsies of the lesion were performed using cold forceps.  Retroflexion was not performed due to a narrow rectal vault and prior intervention. The time to cecum=2 minutes 23 seconds.  Withdrawal time=11 minutes 15 seconds.  The scope was withdrawn and the procedure completed.  COMPLICATIONS: There were no complications.  ENDOSCOPIC IMPRESSION: 1.   Sessile polyp measuring 3 mm in size was found at the  hepatic flexure; polypectomy was performed with cold forceps 2.   There was mild diverticulosis noted in the sigmoid colon 3.   There was evidence of prior intervention in the rectum; small nodule at surgical scar; multiple biopsies of the lesion were performed  RECOMMENDATIONS: 1.  Await pathology results 2.  High fiber diet 3.  You will receive a letter within 1-2 weeks with the results of your biopsy as well as final recommendations.  Please call my office if you have not received a letter after 3 weeks.   eSigned:  Beverley Fiedler, MD 04/05/2012 4:29 PM cc: Karie Soda, MD and Sandford Craze, N.  P.

## 2012-04-05 NOTE — Progress Notes (Signed)
Called to room to assist during endoscopic procedure.  Patient ID and intended procedure confirmed with present staff. Received instructions for my participation in the procedure from the performing physician.  

## 2012-04-05 NOTE — Progress Notes (Signed)
Lidocaine-40mg IV prior to Propofol InductionPropofol given over incremental dosages 

## 2012-04-06 ENCOUNTER — Telehealth: Payer: Self-pay | Admitting: *Deleted

## 2012-04-06 NOTE — Telephone Encounter (Signed)
  Follow up Call-  Call back number 04/05/2012 04/27/2011  Post procedure Call Back phone  # [phone] cell [phone]  Permission to leave phone message Yes Yes     Patient questions:  Do you have a fever, pain , or abdominal swelling? no Pain Score  0 *  Have you tolerated food without any problems? yes  Have you been able to return to your normal activities? yes  Do you have any questions about your discharge instructions: Diet   no Medications  no Follow up visit  no  Do you have questions or concerns about your Care? no  Actions: * If pain score is 4 or above: No action needed, pain <4.

## 2012-04-10 ENCOUNTER — Encounter: Payer: Self-pay | Admitting: Internal Medicine

## 2012-05-25 ENCOUNTER — Other Ambulatory Visit: Payer: Self-pay | Admitting: Internal Medicine

## 2012-05-25 NOTE — Telephone Encounter (Signed)
Rx sent in to pharmacy. 

## 2012-07-24 ENCOUNTER — Other Ambulatory Visit: Payer: Self-pay | Admitting: Internal Medicine

## 2012-07-24 NOTE — Telephone Encounter (Signed)
Denied--Too soon for request; Last Rx 05.23.14, #90x1/SLS

## 2012-08-14 ENCOUNTER — Telehealth: Payer: Self-pay | Admitting: *Deleted

## 2012-08-14 NOTE — Telephone Encounter (Signed)
Received health screening form from [employer]. Form completed and forwarded to Provider for signature.  Ph) [phone]  Fax) [phone].

## 2012-08-15 NOTE — Telephone Encounter (Signed)
signed

## 2012-08-15 NOTE — Telephone Encounter (Signed)
Form faxed to [employer] at [phone].

## 2012-09-26 ENCOUNTER — Ambulatory Visit (INDEPENDENT_AMBULATORY_CARE_PROVIDER_SITE_OTHER): Payer: 59 | Admitting: Family

## 2012-09-26 ENCOUNTER — Encounter: Payer: Self-pay | Admitting: Family

## 2012-09-26 VITALS — BP 132/86 | HR 60 | Temp 97.9°F | Resp 16 | Ht 69.5 in | Wt 211.0 lb

## 2012-09-26 DIAGNOSIS — I10 Essential (primary) hypertension: Secondary | ICD-10-CM

## 2012-09-26 DIAGNOSIS — E785 Hyperlipidemia, unspecified: Secondary | ICD-10-CM

## 2012-09-26 DIAGNOSIS — N529 Male erectile dysfunction, unspecified: Secondary | ICD-10-CM

## 2012-09-26 LAB — HEPATIC FUNCTION PANEL
ALT: 13 U/L (ref 0–53)
AST: 13 U/L (ref 0–37)
Albumin: 4.1 g/dL (ref 3.5–5.2)
Alkaline Phosphatase: 74 U/L (ref 39–117)
Bilirubin, Direct: 0.1 mg/dL (ref 0.0–0.3)
Indirect Bilirubin: 0.3 mg/dL (ref 0.0–0.9)
Total Bilirubin: 0.4 mg/dL (ref 0.3–1.2)
Total Protein: 6.4 g/dL (ref 6.0–8.3)

## 2012-09-26 LAB — BASIC METABOLIC PANEL
BUN: 22 mg/dL (ref 6–23)
CO2: 29 mEq/L (ref 19–32)
Calcium: 8.8 mg/dL (ref 8.4–10.5)
Chloride: 104 mEq/L (ref 96–112)
Creat: 1.01 mg/dL (ref 0.50–1.35)
Glucose, Bld: 114 mg/dL — ABNORMAL HIGH (ref 70–99)
Potassium: 4.9 mEq/L (ref 3.5–5.3)
Sodium: 139 mEq/L (ref 135–145)

## 2012-09-26 MED ORDER — ATORVASTATIN CALCIUM 10 MG PO TABS: 10.0000 mg | ORAL_TABLET | Freq: Every day | ORAL | Status: DC

## 2012-09-26 MED ORDER — LOSARTAN POTASSIUM 50 MG PO TABS
50.0000 mg | ORAL_TABLET | Freq: Every day | ORAL | Status: DC
Start: 2012-09-26 — End: 2013-06-12

## 2012-09-26 MED ORDER — SILDENAFIL CITRATE 100 MG PO TABS: 100.0000 mg | ORAL_TABLET | Freq: Every day | ORAL | Status: DC | PRN

## 2012-09-26 NOTE — Assessment & Plan Note (Signed)
Reinforced importance of compliance with cholesterol medication.  Obtain LFT.

## 2012-09-26 NOTE — Assessment & Plan Note (Signed)
BP stable on current meds. Continue same, obtain bmet.  

## 2012-09-26 NOTE — Patient Instructions (Addendum)
Please complete your lab work prior to leaving.  Increase viagra to 100mg  tabs as needed. Please take atorvastatin nightly. You will be contacted about your referral to Urology. Follow up in 6 months.

## 2012-09-26 NOTE — Progress Notes (Signed)
  Subjective:    Patient ID: Richard Henderson, male    DOB: 12-Jul-1960, 52 y.o.   MRN: [id number]  HPI  Richard Henderson is a 52 yr old male who presents today for follow up.  1) HTN-  Currently maintained on losartan. Denies cp/sob or swelling.  2) Hyperlipidemia-  Currently maintained on lipitor. Has not been good about taking. Denies myalgia.    3) ED- no improvement with viagra.   Review of Systems See HPI  Past Medical History  Diagnosis Date  . Allergic rhinitis   . Hyperlipidemia   . Hypertension   . Colonic mass   . Eczema     on hands  . Sleep apnea     USES C-PAP  . Serrated adenoma of rectum s/p TEM resection 27Jun2013 07/18/2011    History   Social History  . Marital Status: Married    Spouse Name: N/A    Number of Children: 3  . Years of Education: N/A   Occupational History  . Application Systems Analyst    Social History Main Topics  . Smoking status: Never Smoker   . Smokeless tobacco: Never Used  . Alcohol Use: No     Comment: Rare  . Drug Use: No  . Sexual Activity: Not on file   Other Topics Concern  . 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    Past Surgical History  Procedure Laterality Date  . Wisdom tooth extraction  2012  . Transanal endoscopic microsurgery  06/30/2011    Family History  Problem Relation Age of Onset  . Prostate cancer Father   . Alzheimer's disease Father   . Arthritis Other     No Known Allergies  Current Outpatient Prescriptions on File Prior to Visit  Medication Sig Dispense Refill  . cetirizine (ZYRTEC) 10 MG tablet Take 10 mg by mouth daily.        No current facility-administered medications on file prior to visit.    BP 132/86  Pulse 60  Temp(Src) 97.9 F (36.6 C) (Oral)  Resp 16  Ht 5' 9.5" (1.765 m)  Wt 211 lb 0.6 oz (95.727 kg)  BMI 30.73 kg/m2  SpO2 99%       Objective:   Physical  Exam  Constitutional: He is oriented to person, place, and time. He appears well-developed and well-nourished. No distress.  HENT:  Head: Normocephalic and atraumatic.  Cardiovascular: Normal rate and regular rhythm.   No murmur heard. Pulmonary/Chest: Effort normal and breath sounds normal. No respiratory distress. He has no wheezes. He has no rales. He exhibits no tenderness.  Neurological: He is alert and oriented to person, place, and time.  Psychiatric: He has a normal mood and affect. His behavior is normal. Judgment and thought content normal.          Assessment & Plan:

## 2012-09-26 NOTE — Assessment & Plan Note (Signed)
No improvement with Viagra 50mg . Will increase to 100mg  and refer to urology for further evaluation.

## 2012-09-27 ENCOUNTER — Encounter: Payer: Self-pay | Admitting: Family

## 2012-11-23 ENCOUNTER — Telehealth: Payer: Self-pay | Admitting: Pulmonary Disease

## 2012-11-23 DIAGNOSIS — G4733 Obstructive sleep apnea (adult) (pediatric): Secondary | ICD-10-CM

## 2012-11-23 NOTE — Telephone Encounter (Signed)
Pt aware and order sent/  

## 2012-11-23 NOTE — Telephone Encounter (Signed)
Dr. Vassie Loll please advise if [facility] are okay with sending an order for pt or does he need to wait until OV? thanks

## 2012-11-23 NOTE — Telephone Encounter (Signed)
lmomtcb x1 for pt--pt needs OV with RA he has not been seen since 2012. He has openings in [place]

## 2012-11-23 NOTE — Telephone Encounter (Signed)
Ok to send order for supplies but CMN will only be signed after appt

## 2012-11-23 NOTE — Telephone Encounter (Signed)
Pt returned triage's call & set an appt for 12/03/12 w/ RA in [place].  Pt would like to know if he will be able to get any supplies prior to this appt.  Antionette Fairy

## 2012-11-29 ENCOUNTER — Telehealth: Payer: Self-pay | Admitting: Pulmonary Disease

## 2012-11-29 NOTE — Telephone Encounter (Signed)
Download 11/26/12 - avg pr 15 cm, no residuals, good usage 6h

## 2012-12-03 ENCOUNTER — Encounter: Payer: Self-pay | Admitting: Pulmonary Disease

## 2012-12-03 ENCOUNTER — Ambulatory Visit (INDEPENDENT_AMBULATORY_CARE_PROVIDER_SITE_OTHER): Payer: 59 | Admitting: Pulmonary Disease

## 2012-12-03 VITALS — BP 132/76 | HR 79 | Temp 98.2°F | Ht 69.0 in | Wt 200.0 lb

## 2012-12-03 DIAGNOSIS — G4733 Obstructive sleep apnea (adult) (pediatric): Secondary | ICD-10-CM

## 2012-12-03 NOTE — Assessment & Plan Note (Signed)
Your CPAP is set at 15 cm Supplies renewed  Weight loss encouraged, compliance with goal of at least 4-6 hrs every night is the expectation. Advised against medications with sedative side effects Cautioned against driving when sleepy - understanding that sleepiness will vary on a day to day basis

## 2012-12-03 NOTE — Progress Notes (Signed)
   Subjective:    Patient ID: NABEEL GLADSON, male    DOB: 30-Apr-1960, 52 y.o.   MRN: [id number]  HPI  52/M for FU of obstructive sleep apnea  Portable study 'R U asleep' - 38/h s/o severe obstructive sleep apnea   PSG 03/2010 showed moderate obstructive sleep apnea - stopped breathing about 25 times per hour  Was able to tolerate cpap well during the study  CPAP medium comfort gel mask 12 cm Download 4/18-5/21/12 on 12 cm shows good compliance > 6h, residual AHi of 11/h   >>auto 12-15 due to snoring Download on auto 12-15 cm >> good compliance, avg pr 14 cm, few residual events (10/h) acceptable Download 6/13 on auto 12-15 >> acceptable residual events 6/h, good usage , no leak  12/03/2012  2.5 yr FU  Download 11/26/12 - avg pr 15 cm, no residuals, good usage 6h Bedtime 11 p, oob 6 A, wakes up refreshed  coffee am x2  daily  Got new mask - was able  to adjust dial on mask & get better seal pressure ok, mask ok    Review of Systems neg for any significant sore throat, dysphagia, itching, sneezing, nasal congestion or excess/ purulent secretions, fever, chills, sweats, unintended wt loss, pleuritic or exertional cp, hempoptysis, orthopnea pnd or change in chronic leg swelling. Also denies presyncope, palpitations, heartburn, abdominal pain, nausea, vomiting, diarrhea or change in bowel or urinary habits, dysuria,hematuria, rash, arthralgias, visual complaints, headache, numbness weakness or ataxia.     Objective:   Physical Exam  Gen. Pleasant, well-nourished, in no distress ENT - no lesions, no post nasal drip Neck: No JVD, no thyromegaly, no carotid bruits Lungs: no use of accessory muscles, no dullness to percussion, clear without rales or rhonchi  Cardiovascular: Rhythm regular, heart sounds  normal, no murmurs or gallops, no peripheral edema Musculoskeletal: No deformities, no cyanosis or clubbing        Assessment & Plan:

## 2012-12-03 NOTE — Patient Instructions (Signed)
Your CPAP is set at 15 cm Supplies renewed

## 2013-05-06 ENCOUNTER — Other Ambulatory Visit: Payer: Self-pay | Admitting: Family

## 2013-06-12 ENCOUNTER — Other Ambulatory Visit: Payer: Self-pay | Admitting: Family

## 2013-06-12 NOTE — Telephone Encounter (Signed)
Pt last seen 09/2012 and advised to follow up in 6 months (03/2013). Pt is overdue and needs office visit. Sent 2 week supply of losartan and pt needs to be seen for further refills.

## 2013-06-13 NOTE — Telephone Encounter (Signed)
Informed patient of medication refill and he scheduled appointment for 06/26/13

## 2013-06-26 ENCOUNTER — Encounter: Payer: Self-pay | Admitting: Family

## 2013-06-26 ENCOUNTER — Other Ambulatory Visit: Payer: Self-pay | Admitting: Family

## 2013-06-26 ENCOUNTER — Ambulatory Visit (INDEPENDENT_AMBULATORY_CARE_PROVIDER_SITE_OTHER): Payer: 59 | Admitting: Family

## 2013-06-26 VITALS — BP 112/76 | HR 63 | Temp 98.3°F | Resp 16 | Ht 69.0 in | Wt 207.0 lb

## 2013-06-26 DIAGNOSIS — Z0000 Encounter for general adult medical examination without abnormal findings: Secondary | ICD-10-CM

## 2013-06-26 LAB — CBC WITH DIFFERENTIAL/PLATELET
Basophils Absolute: 0 10*3/uL (ref 0.0–0.1)
Basophils Relative: 0 % (ref 0–1)
Eosinophils Absolute: 0.1 10*3/uL (ref 0.0–0.7)
Eosinophils Relative: 2 % (ref 0–5)
HCT: 46.7 % (ref 39.0–52.0)
Hemoglobin: 16.4 g/dL (ref 13.0–17.0)
Lymphocytes Relative: 30 % (ref 12–46)
Lymphs Abs: 1.7 10*3/uL (ref 0.7–4.0)
MCH: 30.7 pg (ref 26.0–34.0)
MCHC: 35.1 g/dL (ref 30.0–36.0)
MCV: 87.5 fL (ref 78.0–100.0)
Monocytes Absolute: 0.4 10*3/uL (ref 0.1–1.0)
Monocytes Relative: 7 % (ref 3–12)
Neutro Abs: 3.4 10*3/uL (ref 1.7–7.7)
Neutrophils Relative %: 61 % (ref 43–77)
Platelets: 200 10*3/uL (ref 150–400)
RBC: 5.34 MIL/uL (ref 4.22–5.81)
RDW: 13.8 % (ref 11.5–15.5)
WBC: 5.6 10*3/uL (ref 4.0–10.5)

## 2013-06-26 NOTE — Assessment & Plan Note (Addendum)
Discussed healthy diet, exercise, weight loss. Obtain fasting labs. Immunizations up to date.

## 2013-06-26 NOTE — Progress Notes (Signed)
Pre visit review using our clinic review tool, if applicable. No additional management support is needed unless otherwise documented below in the visit note/SLS  

## 2013-06-26 NOTE — Progress Notes (Signed)
Subjective:    Patient ID: Richard Henderson, male    DOB: 06-Mar-1960, 53 y.o.   MRN: [id number]  HPI  Patient presents today for complete physical.  Immunizations: up to date Diet: reports improved diet Exercise: refs soccer, going to gym Colonoscopy: up to date  Review of Systems  Constitutional:       Wt Readings from Last 3 Encounters: 06/26/13 : 207 lb (93.895 kg) 12/03/12 : 200 lb (90.719 kg) 09/26/12 : 211 lb 0.6 oz (95.727 kg) Body mass index is 30.55 kg/(m^2).   HENT: Negative for rhinorrhea.   Eyes:       Wears reading glasses  Respiratory: Negative for cough and shortness of breath.   Cardiovascular: Negative for chest pain and leg swelling.  Gastrointestinal: Negative for nausea, vomiting, diarrhea and constipation.  Genitourinary: Negative for dysuria, frequency and hematuria.  Musculoskeletal: Negative for myalgias.       Occasional left ankle pain following remote sprain.    Skin: Negative for rash.  Neurological: Negative for headaches.  Hematological: Negative for adenopathy.  Psychiatric/Behavioral:       Denies depression/anxiety   Past Medical History  Diagnosis Date  . Allergic rhinitis   . Hyperlipidemia   . Hypertension   . Colonic mass   . Eczema     on hands  . Sleep apnea     USES C-PAP  . Serrated adenoma of rectum s/p TEM resection 27Jun2013 07/18/2011    History   Social History  . Marital Status: Married    Spouse Name: N/A    Number of Children: 3  . Years of Education: N/A   Occupational History  . Application Systems Analyst    Social History Main Topics  . Smoking status: Never Smoker   . Smokeless tobacco: Never Used  . Alcohol Use: No     Comment: Rare  . Drug Use: No  . Sexual Activity: Not on file   Other Topics Concern  . 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    Past Surgical History  Procedure  Laterality Date  . Wisdom tooth extraction  2012  . Transanal endoscopic microsurgery  06/30/2011    Family History  Problem Relation Age of Onset  . Prostate cancer Father   . Alzheimer's disease Father   . Arthritis Other     No Known Allergies  Current Outpatient Prescriptions on File Prior to Visit  Medication Sig Dispense Refill  . atorvastatin (LIPITOR) 10 MG tablet TAKE 1 TABLET (10 MG TOTAL) BY MOUTH DAILY.  90 tablet  0  . cetirizine (ZYRTEC) 10 MG tablet Take 10 mg by mouth daily.       [name] losartan (COZAAR) 50 MG tablet TAKE 1 TABLET (50 MG TOTAL) BY MOUTH DAILY.  14 tablet  0   No current facility-administered medications on file prior to visit.    BP 112/76  Pulse 63  Temp(Src) 98.3 F (36.8 C) (Oral)  Resp 16  Ht 5\' 9"  (1.753 m)  Wt 207 lb (93.895 kg)  BMI 30.55 kg/m2  SpO2 98%       Objective:   Physical Exam  Physical Exam  Constitutional: He is oriented to person, place, and time. He appears well-developed and well-nourished. No distress.  HENT:  Head: Normocephalic and atraumatic.  Right Ear: Tympanic membrane and  ear canal normal.  Left Ear: Tympanic membrane and ear canal normal.  Mouth/Throat: Oropharynx is clear and moist.  Eyes: Pupils are equal, round, and reactive to light. No scleral icterus.  Neck: Normal range of motion. No thyromegaly present.  Cardiovascular: Normal rate and regular rhythm.   No murmur heard. Pulmonary/Chest: Effort normal and breath sounds normal. No respiratory distress. He has no wheezes. He has no rales. He exhibits no tenderness.  Abdominal: Soft. Bowel sounds are normal. He exhibits no distension and no mass. There is no tenderness. There is no rebound and no guarding.  Musculoskeletal: He exhibits no edema.  Lymphadenopathy:    He has no cervical adenopathy.  Neurological: He is alert and oriented to person, place, and time.  He exhibits normal muscle tone. Coordination normal.  Skin: Skin is warm and dry.    Psychiatric: He has a normal mood and affect. His behavior is normal. Judgment and thought content normal.          Assessment & Plan:         Assessment & Plan:

## 2013-06-26 NOTE — Patient Instructions (Signed)
Please complete lab work prior to leaving.  Follow up in 6 months.  

## 2013-06-27 LAB — URINALYSIS, ROUTINE W REFLEX MICROSCOPIC
Bilirubin Urine: NEGATIVE
Glucose, UA: NEGATIVE mg/dL
Hgb urine dipstick: NEGATIVE
Ketones, ur: NEGATIVE mg/dL
Leukocytes, UA: NEGATIVE
Nitrite: NEGATIVE
Protein, ur: NEGATIVE mg/dL
Specific Gravity, Urine: 1.027 (ref 1.005–1.030)
Urobilinogen, UA: 0.2 mg/dL (ref 0.0–1.0)
pH: 5 (ref 5.0–8.0)

## 2013-06-27 LAB — BASIC METABOLIC PANEL WITH GFR
BUN: 20 mg/dL (ref 6–23)
CO2: 29 mEq/L (ref 19–32)
Calcium: 9.4 mg/dL (ref 8.4–10.5)
Chloride: 105 mEq/L (ref 96–112)
Creat: 0.93 mg/dL (ref 0.50–1.35)
GFR, Est African American: >89 mL/min
GFR, Est Non African American: >89 mL/min
Glucose, Bld: 118 mg/dL — ABNORMAL HIGH (ref 70–99)
Potassium: 5 mEq/L (ref 3.5–5.3)
Sodium: 142 mEq/L (ref 135–145)

## 2013-06-27 LAB — HEPATIC FUNCTION PANEL
ALT: 12 U/L (ref 0–53)
AST: 12 U/L (ref 0–37)
Albumin: 4.4 g/dL (ref 3.5–5.2)
Alkaline Phosphatase: 77 U/L (ref 39–117)
Bilirubin, Direct: 0.1 mg/dL (ref 0.0–0.3)
Indirect Bilirubin: 0.4 mg/dL (ref 0.2–1.2)
Total Bilirubin: 0.5 mg/dL (ref 0.2–1.2)
Total Protein: 6.7 g/dL (ref 6.0–8.3)

## 2013-06-27 LAB — LIPID PANEL
Cholesterol: 147 mg/dL (ref 0–200)
HDL: 39 mg/dL — ABNORMAL LOW (ref >39)
LDL Cholesterol: 88 mg/dL (ref 0–99)
Total CHOL/HDL Ratio: 3.8 Ratio
Triglycerides: 99 mg/dL (ref <150)
VLDL: 20 mg/dL (ref 0–40)

## 2013-06-27 LAB — URINALYSIS, MICROSCOPIC ONLY
Bacteria, UA: NONE SEEN
Casts: NONE SEEN
Crystals: NONE SEEN
Squamous Epithelial / LPF: NONE SEEN

## 2013-06-27 LAB — TSH: TSH: 2.553 u[IU]/mL (ref 0.350–4.500)

## 2013-06-28 LAB — HEMOGLOBIN A1C
Hgb A1c MFr Bld: 5.8 % — ABNORMAL HIGH (ref <5.7)
Mean Plasma Glucose: 120 mg/dL — ABNORMAL HIGH (ref <117)

## 2013-06-30 ENCOUNTER — Encounter: Payer: Self-pay | Admitting: Family

## 2013-07-01 ENCOUNTER — Telehealth: Payer: Self-pay | Admitting: Family

## 2013-07-01 NOTE — Telephone Encounter (Signed)
Please notify pt that his form is completed and ready for pick up.

## 2013-07-01 NOTE — Telephone Encounter (Signed)
Form faxed to [employer] at [phone].

## 2013-07-01 NOTE — Telephone Encounter (Signed)
Left detailed message on pt's voice mail

## 2013-07-02 ENCOUNTER — Encounter: Payer: Self-pay | Admitting: Family

## 2013-07-07 ENCOUNTER — Other Ambulatory Visit: Payer: Self-pay | Admitting: Family

## 2013-08-30 ENCOUNTER — Telehealth: Payer: Self-pay | Admitting: Family

## 2013-08-30 NOTE — Telephone Encounter (Signed)
Patient called in stating that [facility] never received fax regarding health screening. Refaxed paperwork to [facility] to [phone]

## 2013-08-31 ENCOUNTER — Other Ambulatory Visit: Payer: Self-pay | Admitting: Family

## 2013-09-05 ENCOUNTER — Telehealth: Payer: Self-pay | Admitting: Family

## 2013-09-05 NOTE — Telephone Encounter (Signed)
Form faxed to below #. 

## 2013-09-05 NOTE — Telephone Encounter (Signed)
Caller name: Tim Relation to pt: Call back number:[phone]   Reason for call:  Pt is needing the wellness forms from his CPE refaxed to [facility].  The fax # is [phone].  The previous fax number was incorrect.  See previous phone messages if needed.  thanks

## 2013-10-23 ENCOUNTER — Encounter: Payer: Self-pay | Admitting: Medical

## 2013-10-23 ENCOUNTER — Ambulatory Visit (INDEPENDENT_AMBULATORY_CARE_PROVIDER_SITE_OTHER): Payer: 59 | Admitting: Medical

## 2013-10-23 ENCOUNTER — Other Ambulatory Visit: Payer: Self-pay

## 2013-10-23 ENCOUNTER — Telehealth: Payer: Self-pay | Admitting: Family

## 2013-10-23 ENCOUNTER — Ambulatory Visit (HOSPITAL_BASED_OUTPATIENT_CLINIC_OR_DEPARTMENT_OTHER)
Admission: RE | Admit: 2013-10-23 | Discharge: 2013-10-23 | Disposition: A | Discharge status: Home / Self Care | Payer: 59 | Source: Ambulatory Visit | Attending: Medical | Admitting: Medical

## 2013-10-23 VITALS — BP 145/85 | HR 60 | Temp 99.0°F | Ht 65.9 in | Wt 195.6 lb

## 2013-10-23 DIAGNOSIS — M25561 Pain in right knee: Secondary | ICD-10-CM | POA: Insufficient documentation

## 2013-10-23 MED ORDER — DICLOFENAC SODIUM 75 MG PO TBEC: 75.0000 mg | DELAYED_RELEASE_TABLET | Freq: Two times a day (BID) | ORAL | Status: DC

## 2013-10-23 NOTE — Telephone Encounter (Signed)
Patient had XR. Order was placed.

## 2013-10-23 NOTE — Patient Instructions (Addendum)
For your knee pain I want you to get xray today to assess overall condition of your knee.   You may have soft tissue/strain injury. You can use otc knee brace to stabilize the knee  and I wrote you a prescription of diclofenac. Stop otc nsaids.  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  Follow up in 10 days or as needed.

## 2013-10-23 NOTE — Telephone Encounter (Signed)
Caller name: Marcia Relation to pt: self Call back number: [phone] Pharmacy:  Reason for call:   Patient wanted to let Richard Henderson know that he is also having pain underneath the bottom of the knee cap after sitting for awhile

## 2013-10-23 NOTE — Progress Notes (Signed)
Pre visit review using our clinic review tool, if applicable. No additional management support is needed unless otherwise documented below in the visit note. 

## 2013-10-23 NOTE — Progress Notes (Signed)
   Subjective:    Patient ID: Richard Henderson, male    DOB: [birth year], 53 y.o.   MRN: [id number]  HPI  Pt in rt knee pain since Thursday night. 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 He thinks this caused the pain but no acute injury. Also no history of knee pain. No knee surgeries. Pt taking advil for pain. Pt pain level 3/10 now. Also he did a lot of walking recenlty over weekendand that seemed to exacerbate the pain  Past Medical History  Diagnosis Date  . Allergic rhinitis   . Hyperlipidemia   . Hypertension   . Colonic mass   . Eczema     on hands  . Sleep apnea     USES C-PAP  . Serrated adenoma of rectum s/p TEM resection 27Jun2013 07/18/2011    History   Social History  . Marital Status: Married    Spouse Name: N/A    Number of Children: 3  . Years of Education: N/A   Occupational History  . Application Systems Analyst    Social History Main Topics  . Smoking status: Never Smoker   . Smokeless tobacco: Never Used  . Alcohol Use: No     Comment: Rare  . Drug Use: No  . Sexual Activity: Not on file   Other Topics Concern  . 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    Past Surgical History  Procedure Laterality Date  . Wisdom tooth extraction  2012  . Transanal endoscopic microsurgery  06/30/2011    Family History  Problem Relation Age of Onset  . Prostate cancer Father   . Alzheimer's disease Father   . Arthritis Other     No Known Allergies  Current Outpatient Prescriptions on File Prior to Visit  Medication Sig Dispense Refill  . atorvastatin (LIPITOR) 10 MG tablet TAKE 1 TABLET BY MOUTH EVERY DAY  90 tablet  1  . cetirizine (ZYRTEC) 10 MG tablet Take 10 mg by mouth daily.       [name] losartan (COZAAR) 50 MG tablet TAKE 1 TABLET (50 MG TOTAL) BY MOUTH DAILY.  30 tablet  5   No current facility-administered medications on file  prior to visit.    BP 145/85  Pulse 60  Temp(Src) 99 F (37.2 C) (Oral)  Ht 5' 5.9" (1.674 m)  Wt 195 lb 9.6 oz (88.724 kg)  BMI 31.66 kg/m2  SpO2 100%      Review of Systems  Constitutional: Negative for fever, chills and fatigue.  Respiratory: Negative for cough, chest tightness, shortness of breath and wheezing.   Cardiovascular: Negative for chest pain, palpitations and leg swelling.  Musculoskeletal:       Rt knee pain  Skin: Negative for pallor, rash and wound.  Neurological: Negative.   Hematological: Negative for adenopathy. Does not bruise/bleed easily.       Objective:   Physical Exam  General- No acute distress, Pleasant. Lungs- CTA Heart-RRR Rt knee- lateral aspect mild tender. No instability. Full Rom. Lateral aspect of rt knee mild tender to palpation. Faint minimal crepitus. No obvious swelling. No warmth.  Rt calf not swollen and negative homans sign.        Assessment & Plan:

## 2013-10-23 NOTE — Telephone Encounter (Signed)
Caller name: Tyland Relation to pt: self Call back number: [phone] Pharmacy: [facility] [address]  Reason for call:   Patient thought an rx was going to be called in regarding the knee pain that he is having. He is at the pharmacy now and they have not received the rx

## 2013-10-23 NOTE — Telephone Encounter (Signed)
Pt diclofenac sent to pharmacy. Vernie Shanks Lpn notified pt of xray result.

## 2013-10-23 NOTE — Assessment & Plan Note (Signed)
Pain over lateral aspect. He does have mild joint space narrowing on xray and tiny effusion. Rx dicolfenac, knee brace otc and rest knee for 5-7 days. 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 If pain still persist will refer to orthopedist. Stop advil otc.

## 2013-12-12 ENCOUNTER — Ambulatory Visit: Payer: 59 | Admitting: Pulmonary Disease

## 2013-12-19 ENCOUNTER — Ambulatory Visit (INDEPENDENT_AMBULATORY_CARE_PROVIDER_SITE_OTHER): Payer: 59 | Admitting: Pulmonary Disease

## 2013-12-19 ENCOUNTER — Encounter: Payer: Self-pay | Admitting: Pulmonary Disease

## 2013-12-19 VITALS — BP 124/82 | HR 61 | Ht 69.0 in | Wt 201.0 lb

## 2013-12-19 DIAGNOSIS — G4733 Obstructive sleep apnea (adult) (pediatric): Secondary | ICD-10-CM

## 2013-12-19 NOTE — Progress Notes (Signed)
   Subjective:    Patient ID: MARCIO Henderson, male    DOB: December 14, 1960, 53 y.o.   MRN: [id number]  HPI  53/M for FU of obstructive sleep apnea  Portable study  - 38/h s/o severe obstructive sleep apnea   PSG 03/2010 showed moderate obstructive sleep apnea - AHI 25 / hour -CPAP medium comfort gel mask 12 cm Download 4/18-5/21/12 on 12 cm shows good compliance > 6h, residual AHi of 11/h   >>auto 12-15 due to snoring Download on auto 12-15 cm >> good compliance, avg pr 14 cm, few residual events (10/h) acceptable Download 06/2011 on auto 12-15 >> acceptable residual events 6/h, good usage , no leak  Download 11/26/12 - avg pr 15 cm, no residuals, good usage 6h  12/19/2013  Chief Complaint  Patient presents with  . Follow-up    wearing CPAP every night approx 7 hours nightly; CPAP download was sent to [place] office this morning per patient; just got new supplies in the mail.     Annual FU  Got new mask  pressure ok, mask ok 50m download - avg pr 14, good usage, no leak Feels well rested, no snoring per wife    Review of Systems neg for any significant sore throat, dysphagia, itching, sneezing, nasal congestion or excess/ purulent secretions, fever, chills, sweats, unintended wt loss, pleuritic or exertional cp, hempoptysis, orthopnea pnd or change in chronic leg swelling. Also denies presyncope, palpitations, heartburn, abdominal pain, nausea, vomiting, diarrhea or change in bowel or urinary habits, dysuria,hematuria, rash, arthralgias, visual complaints, headache, numbness weakness or ataxia.     Objective:   Physical Exam  Gen. Pleasant, well-nourished, in no distress ENT - no lesions, no post nasal drip Neck: No JVD, no thyromegaly, no carotid bruits Lungs: no use of accessory muscles, no dullness to percussion, clear without rales or rhonchi  Cardiovascular: Rhythm regular, heart sounds  normal, no murmurs or gallops, no peripheral edema Musculoskeletal: No deformities, no  cyanosis or clubbing        Assessment & Plan:

## 2013-12-19 NOTE — Assessment & Plan Note (Signed)
Your CPAP is set at 14 cm CPAP supplies will be renewed  Weight loss encouraged, compliance with goal of at least 4-6 hrs every night is the expectation. Advised against medications with sedative side effects Cautioned against driving when sleepy - understanding that sleepiness will vary on a day to day basis

## 2013-12-19 NOTE — Patient Instructions (Signed)
Your CPAP is set at 14 cm CPAP supplies will be renewed

## 2014-01-19 ENCOUNTER — Other Ambulatory Visit: Payer: Self-pay | Admitting: Family

## 2014-01-20 NOTE — Telephone Encounter (Signed)
Patient follow-up appointment scheduled for 02/07/14 per patient request to recheck blood pressure.    Rx refilled until that time.    eal

## 2014-02-07 ENCOUNTER — Encounter: Payer: Self-pay | Admitting: Family

## 2014-02-07 ENCOUNTER — Ambulatory Visit (INDEPENDENT_AMBULATORY_CARE_PROVIDER_SITE_OTHER): Payer: 59 | Admitting: Family

## 2014-02-07 VITALS — BP 128/70 | HR 58 | Temp 98.0°F | Resp 16 | Ht 69.0 in | Wt 203.6 lb

## 2014-02-07 DIAGNOSIS — I10 Essential (primary) hypertension: Secondary | ICD-10-CM

## 2014-02-07 DIAGNOSIS — R739 Hyperglycemia, unspecified: Secondary | ICD-10-CM | POA: Insufficient documentation

## 2014-02-07 LAB — BASIC METABOLIC PANEL
BUN: 18 mg/dL (ref 6–23)
CO2: 31 mEq/L (ref 19–32)
Calcium: 9.4 mg/dL (ref 8.4–10.5)
Chloride: 104 mEq/L (ref 96–112)
Creatinine, Ser: 0.97 mg/dL (ref 0.40–1.50)
GFR: 85.78 mL/min (ref >60.00)
Glucose, Bld: 105 mg/dL — ABNORMAL HIGH (ref 70–99)
Potassium: 4.1 mEq/L (ref 3.5–5.1)
Sodium: 139 mEq/L (ref 135–145)

## 2014-02-07 LAB — HEMOGLOBIN A1C: Hgb A1c MFr Bld: 5.6 % (ref 4.6–6.5)

## 2014-02-07 MED ORDER — LOSARTAN POTASSIUM 50 MG PO TABS: ORAL_TABLET | ORAL | Status: DC

## 2014-02-07 NOTE — Assessment & Plan Note (Signed)
Obtain a1c.  

## 2014-02-07 NOTE — Progress Notes (Signed)
Pre visit review using our clinic review tool, if applicable. No additional management support is needed unless otherwise documented below in the visit note. 

## 2014-02-07 NOTE — Patient Instructions (Addendum)
Please complete lab work prior to leaving. Follow up in July for complete physical.

## 2014-02-07 NOTE — Assessment & Plan Note (Addendum)
Stable on current meds, cont same. Obtain follow up bmet.

## 2014-02-07 NOTE — Progress Notes (Signed)
Subjective:    Patient ID: Richard Henderson, male    DOB: [birth year], 54 y.o.   MRN: [id number]  HPI  Richard Henderson is a 54 yr old male who presents today for follow up of his blood pressure.  Patient is currently maintained on the following medications for blood pressure:   Patient reports good compliance with blood pressure medications. Losartan 50 Patient denies chest pain, shortness of breath or swelling. Last 3 blood pressure readings in our office are as follows: BP Readings from Last 3 Encounters:  02/07/14 128/70  12/19/13 124/82  10/23/13 145/85  tries to limit sodium in his diet.    Review of Systems See HPI  Past Medical History  Diagnosis Date  . Allergic rhinitis   . Hyperlipidemia   . Hypertension   . Colonic mass   . Eczema     on hands  . Sleep apnea     USES C-PAP  . Serrated adenoma of rectum s/p TEM resection 27Jun2013 07/18/2011    History   Social History  . Marital Status: Married    Spouse Name: N/A    Number of Children: 3  . Years of Education: N/A   Occupational History  . Application Systems Analyst    Social History Main Topics  . Smoking status: Never Smoker   . Smokeless tobacco: Never Used  . Alcohol Use: No     Comment: Rare  . Drug Use: No  . Sexual Activity: Not on file   Other Topics Concern  . 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    Past Surgical History  Procedure Laterality Date  . Wisdom tooth extraction  2012  . Transanal endoscopic microsurgery  06/30/2011    Family History  Problem Relation Age of Onset  . Prostate cancer Father   . Alzheimer's disease Father   . Arthritis Other     No Known Allergies  Current Outpatient Prescriptions on File Prior to Visit  Medication Sig Dispense Refill  . atorvastatin (LIPITOR) 10 MG tablet TAKE 1 TABLET BY MOUTH EVERY DAY 90 tablet 1  . cetirizine (ZYRTEC)  10 MG tablet Take 10 mg by mouth daily.     [name] losartan (COZAAR) 50 MG tablet TAKE 1 TABLET (50 MG TOTAL) BY MOUTH DAILY. 30 tablet 0  . aspirin EC 81 MG tablet Take 81 mg by mouth daily.     No current facility-administered medications on file prior to visit.    BP 128/70 mmHg  Pulse 58  Temp(Src) 98 F (36.7 C) (Oral)  Resp 16  Ht 5\' 9"  (1.753 m)  Wt 203 lb 9.6 oz (92.352 kg)  BMI 30.05 kg/m2  SpO2 99%       Objective:   Physical Exam  Constitutional: He is oriented to person, place, and time. He appears well-developed and well-nourished. No distress.  HENT:  Head: Normocephalic and atraumatic.  Cardiovascular: Normal rate and regular rhythm.   No murmur heard. Pulmonary/Chest: Effort normal and breath sounds normal. No respiratory distress. He has no wheezes. He has no rales.  Musculoskeletal: He exhibits no edema.  Neurological: He is alert and oriented to person, place, and time.  Skin: Skin is warm and dry.  Psychiatric: He has a normal mood and affect. His behavior is normal. Thought content normal.  Assessment & Plan:

## 2014-02-11 ENCOUNTER — Encounter: Payer: Self-pay | Admitting: Family

## 2014-02-18 ENCOUNTER — Other Ambulatory Visit: Payer: Self-pay | Admitting: Family

## 2014-02-18 NOTE — Telephone Encounter (Signed)
Medication Detail      Disp Refills Start End     atorvastatin (LIPITOR) 10 MG tablet 90 tablet 0 02/18/2014     Sig: TAKE 1 TABLET BY MOUTH EVERY DAY    E-Prescribing Status: Receipt confirmed by pharmacy (02/18/2014 8:28 AM EST)       Rx request to pharmacy/SLS

## 2014-06-12 ENCOUNTER — Telehealth: Payer: Self-pay | Admitting: Family

## 2014-06-12 NOTE — Telephone Encounter (Signed)
Pre Visit letter sent  °

## 2014-07-02 ENCOUNTER — Telehealth: Payer: Self-pay | Admitting: Behavioral Health

## 2014-07-02 NOTE — Telephone Encounter (Signed)
Unable to reach patient at time of Pre-Visit Call.  Left message for patient to return call when available.    

## 2014-07-03 ENCOUNTER — Encounter: Payer: 59 | Admitting: Family

## 2014-07-03 DIAGNOSIS — Z0289 Encounter for other administrative examinations: Secondary | ICD-10-CM

## 2014-07-04 ENCOUNTER — Encounter: Payer: 59 | Admitting: Family

## 2014-07-04 ENCOUNTER — Telehealth: Payer: Self-pay | Admitting: Family

## 2014-07-04 ENCOUNTER — Encounter: Payer: Self-pay | Admitting: Family

## 2014-07-04 NOTE — Telephone Encounter (Signed)
Pt was no show 07/03/14 9:00am, cpe appt, pt has not rescheduled, mailing letter, charge?

## 2014-07-04 NOTE — Telephone Encounter (Signed)
Yes please

## 2014-08-30 ENCOUNTER — Other Ambulatory Visit: Payer: Self-pay | Admitting: Family

## 2014-09-01 NOTE — Telephone Encounter (Signed)
90 day supply losartan sent to pharmacy. Pt was due for fasting physical in July.  Please call pt to arrange appt. With Melissa.  Thanks!

## 2014-09-03 ENCOUNTER — Other Ambulatory Visit: Payer: Self-pay | Admitting: Family

## 2014-09-04 NOTE — Telephone Encounter (Signed)
Last filled: 02/18/14 Amt: 90, 0 Last OV: 02/07/14 Next appt: 09/17/14 for CPE  Med filled x 30 days.

## 2014-09-11 NOTE — Telephone Encounter (Signed)
Patient scheduled for physical 09/17/2014

## 2014-09-17 ENCOUNTER — Ambulatory Visit (INDEPENDENT_AMBULATORY_CARE_PROVIDER_SITE_OTHER): Payer: 59 | Admitting: Family

## 2014-09-17 ENCOUNTER — Encounter: Payer: Self-pay | Admitting: Family

## 2014-09-17 VITALS — BP 131/74 | HR 58 | Temp 98.1°F | Resp 18 | Ht 69.0 in | Wt 198.8 lb

## 2014-09-17 DIAGNOSIS — Z0000 Encounter for general adult medical examination without abnormal findings: Secondary | ICD-10-CM | POA: Diagnosis not present

## 2014-09-17 DIAGNOSIS — Z23 Encounter for immunization: Secondary | ICD-10-CM

## 2014-09-17 LAB — CBC WITH DIFFERENTIAL/PLATELET
Basophils Absolute: 0 10*3/uL (ref 0.0–0.1)
Basophils Relative: 0.4 % (ref 0.0–3.0)
Eosinophils Absolute: 0.1 10*3/uL (ref 0.0–0.7)
Eosinophils Relative: 1.1 % (ref 0.0–5.0)
HCT: 46.6 % (ref 39.0–52.0)
Hemoglobin: 15.8 g/dL (ref 13.0–17.0)
Lymphocytes Relative: 24 % (ref 12.0–46.0)
Lymphs Abs: 1.4 10*3/uL (ref 0.7–4.0)
MCHC: 33.9 g/dL (ref 30.0–36.0)
MCV: 89.6 fl (ref 78.0–100.0)
Monocytes Absolute: 0.5 10*3/uL (ref 0.1–1.0)
Monocytes Relative: 8.9 % (ref 3.0–12.0)
Neutro Abs: 3.8 10*3/uL (ref 1.4–7.7)
Neutrophils Relative %: 65.6 % (ref 43.0–77.0)
Platelets: 206 10*3/uL (ref 150.0–400.0)
RBC: 5.2 Mil/uL (ref 4.22–5.81)
RDW: 13.5 % (ref 11.5–15.5)
WBC: 5.7 10*3/uL (ref 4.0–10.5)

## 2014-09-17 LAB — URINALYSIS, ROUTINE W REFLEX MICROSCOPIC
Bilirubin Urine: NEGATIVE
Ketones, ur: NEGATIVE
Leukocytes, UA: NEGATIVE
Nitrite: NEGATIVE
Specific Gravity, Urine: >=1.03 — AB (ref 1.000–1.030)
Total Protein, Urine: NEGATIVE
Urine Glucose: NEGATIVE
Urobilinogen, UA: 0.2 (ref 0.0–1.0)
pH: 6 (ref 5.0–8.0)

## 2014-09-17 LAB — HEPATIC FUNCTION PANEL
ALT: 12 U/L (ref 0–53)
AST: 12 U/L (ref 0–37)
Albumin: 4.3 g/dL (ref 3.5–5.2)
Alkaline Phosphatase: 81 U/L (ref 39–117)
Bilirubin, Direct: 0.2 mg/dL (ref 0.0–0.3)
Total Bilirubin: 1 mg/dL (ref 0.2–1.2)
Total Protein: 6.6 g/dL (ref 6.0–8.3)

## 2014-09-17 LAB — BASIC METABOLIC PANEL
BUN: 19 mg/dL (ref 6–23)
CO2: 30 mEq/L (ref 19–32)
Calcium: 9.3 mg/dL (ref 8.4–10.5)
Chloride: 105 mEq/L (ref 96–112)
Creatinine, Ser: 0.94 mg/dL (ref 0.40–1.50)
GFR: 88.74 mL/min (ref >60.00)
Glucose, Bld: 103 mg/dL — ABNORMAL HIGH (ref 70–99)
Potassium: 4.8 mEq/L (ref 3.5–5.1)
Sodium: 141 mEq/L (ref 135–145)

## 2014-09-17 LAB — LIPID PANEL
Cholesterol: 145 mg/dL (ref 0–200)
HDL: 43 mg/dL (ref >39.00)
LDL Cholesterol: 88 mg/dL (ref 0–99)
NonHDL: 102.14
Total CHOL/HDL Ratio: 3
Triglycerides: 70 mg/dL (ref 0.0–149.0)
VLDL: 14 mg/dL (ref 0.0–40.0)

## 2014-09-17 LAB — PSA: PSA: 0.91 ng/mL (ref 0.10–4.00)

## 2014-09-17 LAB — TSH: TSH: 2.35 u[IU]/mL (ref 0.35–4.50)

## 2014-09-17 NOTE — Patient Instructions (Addendum)
Please complete lab work prior to leaving. Continue regular exercise and work on healthy diet.

## 2014-09-17 NOTE — Progress Notes (Signed)
Pre visit review using our clinic review tool, if applicable. No additional management support is needed unless otherwise documented below in the visit note. 

## 2014-09-17 NOTE — Progress Notes (Addendum)
Subjective:    Patient ID: Richard Henderson, male    DOB: 1961-01-01, 54 y.o.   MRN: [id number]  HPI  [name] is a 54 yr old male who presents today for cpx.  Immunizations: tetanus 2014, flu today Diet:reports diet not that healthy- too many sweets Exercise: soccer coach and referee Wt Readings from Last 3 Encounters:  09/17/14 198 lb 12.8 oz (90.175 kg)  02/07/14 203 lb 9.6 oz (92.352 kg)  12/19/13 201 lb (91.173 kg)  Colonoscopy: 2014 Dental: up to date Eye exam: up to date  Review of Systems  Constitutional: Negative for unexpected weight change.  HENT: Negative for rhinorrhea.   Eyes: Negative for visual disturbance.  Respiratory: Negative for cough, chest tightness and shortness of breath.   Cardiovascular: Negative for leg swelling.  Gastrointestinal: Negative for diarrhea, constipation and blood in stool.  Genitourinary: Negative for dysuria and frequency.  Musculoskeletal:       Notes achilles tendon pain intermittently right.  Noticed last night.  Now resolved.    Skin: Negative for rash.  Hematological: Negative for adenopathy.  Psychiatric/Behavioral:       Denies depression/anxiety     Past Medical History  Diagnosis Date  . Allergic rhinitis   . Hyperlipidemia   . Hypertension   . Colonic mass   . Eczema     on hands  . Sleep apnea     USES C-PAP  . Serrated adenoma of rectum s/p TEM resection 27Jun2013 07/18/2011    Social History   Social History  . Marital Status: Married    Spouse Name: N/A  . Number of Children: 3  . Years of Education: N/A   Occupational History  . Application Systems Analyst    Social History Main Topics  . Smoking status: Never Smoker   . Smokeless tobacco: Never Used  . Alcohol Use: No     Comment: Rare  . Drug Use: No  . Sexual Activity: Not on file   Other Topics Concern  . 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    Past Surgical History  Procedure Laterality Date  . Wisdom tooth extraction  2012  . Transanal endoscopic microsurgery  06/30/2011    Family History  Problem Relation Age of Onset  . Prostate cancer Father   . Alzheimer's disease Father   . Arthritis Other     No Known Allergies  Current Outpatient Prescriptions on File Prior to Visit  Medication Sig Dispense Refill  . aspirin EC 81 MG tablet Take 81 mg by mouth daily.    [name] atorvastatin (LIPITOR) 10 MG tablet TAKE 1 TABLET BY MOUTH EVERY DAY 30 tablet 0  . cetirizine (ZYRTEC) 10 MG tablet Take 10 mg by mouth daily.     [name] losartan (COZAAR) 50 MG tablet TAKE 1 TABLET (50 MG TOTAL) BY MOUTH DAILY. 90 tablet 0   No current facility-administered medications on file prior to visit.    BP 131/74 mmHg  Pulse 58  Temp(Src) 98.1 F (36.7 C) (Oral)  Resp 18  Ht 5\' 9"  (1.753 m)  Wt 198 lb 12.8 oz (90.175 kg)  BMI 29.34 kg/m2  SpO2 100%       Objective:   Physical Exam  Physical Exam  Constitutional: He is oriented to person, place, and time. He appears well-developed  and well-nourished. No distress.  HENT:  Head: Normocephalic and atraumatic.  Right Ear: Tympanic membrane and ear canal normal. (bilateral small exostosis in ear canal) Left Ear: Tympanic membrane and ear canal normal.  Mouth/Throat: Oropharynx is clear and moist.  Eyes: Pupils are equal, round, and reactive to light. No scleral icterus.  Neck: Normal range of motion. No thyromegaly present.  Cardiovascular: Normal rate and regular rhythm.   No murmur heard. Pulmonary/Chest: Effort normal and breath sounds normal. No respiratory distress. He has no wheezes. He has no rales. He exhibits no tenderness.  Abdominal: Soft. Bowel sounds are normal. He exhibits no distension and no mass. There is no tenderness. There is no rebound and no guarding.  Musculoskeletal: He exhibits no edema.  Lymphadenopathy:    He has no cervical adenopathy.    Neurological: He is alert and oriented to person, place, and time. He has normal patellar reflexes. He exhibits normal muscle tone. Coordination normal.  Skin: Skin is warm and dry.  Psychiatric: He has a normal mood and affect. His behavior is normal. Judgment and thought content normal.          Assessment & Plan:         Assessment & Plan:  EKG tracing is personally reviewed.  EKG notes NSR.  No acute changes.

## 2014-09-17 NOTE — Assessment & Plan Note (Signed)
Flu shot, routine labs today. Discussed healthy diet, continue reg exercise.

## 2014-09-18 ENCOUNTER — Encounter: Payer: Self-pay | Admitting: Family

## 2014-09-18 NOTE — Addendum Note (Signed)
Addended by: Debbrah Alar on: 09/18/2014 01:38 PM   Modules accepted: Miquel Dunn

## 2014-10-26 ENCOUNTER — Other Ambulatory Visit: Payer: Self-pay | Admitting: Family

## 2014-12-09 ENCOUNTER — Ambulatory Visit (INDEPENDENT_AMBULATORY_CARE_PROVIDER_SITE_OTHER): Payer: 59 | Admitting: Family

## 2014-12-09 ENCOUNTER — Encounter (INDEPENDENT_AMBULATORY_CARE_PROVIDER_SITE_OTHER): Payer: Self-pay

## 2014-12-09 ENCOUNTER — Encounter: Payer: Self-pay | Admitting: Family

## 2014-12-09 VITALS — BP 146/77 | HR 58 | Temp 98.5°F | Resp 16 | Ht 69.0 in | Wt 199.6 lb

## 2014-12-09 DIAGNOSIS — M545 Low back pain, unspecified: Secondary | ICD-10-CM

## 2014-12-09 MED ORDER — METHYLPREDNISOLONE 4 MG PO TBPK: ORAL_TABLET | ORAL | Status: DC

## 2014-12-09 MED ORDER — CYCLOBENZAPRINE HCL 5 MG PO TABS: 5.0000 mg | ORAL_TABLET | Freq: Three times a day (TID) | ORAL | Status: DC | PRN

## 2014-12-09 NOTE — Patient Instructions (Signed)
Please start medrol dose pak and as needed flexeril (muscle relaxer). Call if symptoms not improved in 1 week.  Back Pain, Adult Back pain is very common in adults.The cause of back pain is rarely dangerous and the pain often gets better over time.The cause of your back pain may not be known. Some common causes of back pain include:  Strain of the muscles or ligaments supporting the spine.  Wear and tear (degeneration) of the spinal disks.  Arthritis.  Direct injury to the back. For many people, back pain may return. Since back pain is rarely dangerous, most people can learn to manage this condition on their own. HOME CARE INSTRUCTIONS Watch your back pain for any changes. The following actions may help to lessen any discomfort you are feeling:  Remain active. It is stressful on your back to sit or stand in one place for long periods of time. Do not sit, drive, or stand in one place for more than 30 minutes at a time. Take short walks on even surfaces as soon as you are able.Try to increase the length of time you walk each day.  Exercise regularly as directed by your health care provider. Exercise helps your back heal faster. It also helps avoid future injury by keeping your muscles strong and flexible.  Do not stay in bed.Resting more than 1-2 days can delay your recovery.  Pay attention to your body when you bend and lift. The most comfortable positions are those that put less stress on your recovering back. Always use proper lifting techniques, including:  Bending your knees.  Keeping the load close to your body.  Avoiding twisting.  Find a comfortable position to sleep. Use a firm mattress and lie on your side with your knees slightly bent. If you lie on your back, put a pillow under your knees.  Avoid feeling anxious or stressed.Stress increases muscle tension and can worsen back pain.It is important to recognize when you are anxious or stressed and learn ways to manage  it, such as with exercise.  Take medicines only as directed by your health care provider. Over-the-counter medicines to reduce pain and inflammation are often the most helpful.Your health care provider may prescribe muscle relaxant drugs.These medicines help dull your pain so you can more quickly return to your normal activities and healthy exercise.  Apply ice to the injured area:  Put ice in a plastic bag.  Place a towel between your skin and the bag.  Leave the ice on for 20 minutes, 2-3 times a day for the first 2-3 days. After that, ice and heat may be alternated to reduce pain and spasms.  Maintain a healthy weight. Excess weight puts extra stress on your back and makes it difficult to maintain good posture. SEEK MEDICAL CARE IF:  You have pain that is not relieved with rest or medicine.  You have increasing pain going down into the legs or buttocks.  You have pain that does not improve in one week.  You have night pain.  You lose weight.  You have a fever or chills. SEEK IMMEDIATE MEDICAL CARE IF:   You develop new bowel or bladder control problems.  You have unusual weakness or numbness in your arms or legs.  You develop nausea or vomiting.  You develop abdominal pain.  You feel faint.   This information is not intended to replace advice given to you by your health care provider. Make sure you discuss any questions you have with your  health care provider.   Document Released: 12/20/2004 Document Revised: 01/10/2014 Document Reviewed: 04/23/2013 Elsevier Interactive Patient Education [employer].

## 2014-12-09 NOTE — Progress Notes (Signed)
Pre visit review using our clinic review tool, if applicable. No additional management support is needed unless otherwise documented below in the visit note. 

## 2014-12-09 NOTE — Progress Notes (Signed)
Subjective:    Patient ID: Richard Henderson, male    DOB: 1960-01-09, 54 y.o.   MRN: [id number]  HPI  Mr. Ma is a 54 yr old male who presents today with chief complaint of low back pain. Pain has been present for 5 days.  Felt something pull at soccer practice.  Then became tighter.  Could "barely move Friday or Saturday."  Tried hydrocodone and ibuprofen Friday and Saturday. Took ibuprofen last night with mild improvement in his pain.  Pain is non- radiating. No numbness/weakness/bowel/bladder incontinence.   Review of Systems See HPI  Past Medical History  Diagnosis Date  . Allergic rhinitis   . Hyperlipidemia   . Hypertension   . Colonic mass   . Eczema     on hands  . Sleep apnea     USES C-PAP  . Serrated adenoma of rectum s/p TEM resection 27Jun2013 07/18/2011    Social History   Social History  . Marital Status: Married    Spouse Name: N/A  . Number of Children: 3  . Years of Education: N/A   Occupational History  . Application Systems Analyst    Social History Main Topics  . Smoking status: Never Smoker   . Smokeless tobacco: Never Used  . Alcohol Use: No     Comment: Rare  . Drug Use: No  . Sexual Activity: Not on file   Other Topics Concern  . 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    Past Surgical History  Procedure Laterality Date  . Wisdom tooth extraction  2012  . Transanal endoscopic microsurgery  06/30/2011    Family History  Problem Relation Age of Onset  . Prostate cancer Father   . Alzheimer's disease Father   . Arthritis Other     No Known Allergies  Current Outpatient Prescriptions on File Prior to Visit  Medication Sig Dispense Refill  . aspirin EC 81 MG tablet Take 81 mg by mouth daily.    [name] atorvastatin (LIPITOR) 10 MG tablet TAKE 1 TABLET BY MOUTH EVERY DAY 30 tablet 6  . losartan (COZAAR) 50 MG tablet TAKE 1 TABLET  (50 MG TOTAL) BY MOUTH DAILY. 90 tablet 0   No current facility-administered medications on file prior to visit.    BP 146/77 mmHg  Pulse 58  Temp(Src) 98.5 F (36.9 C) (Oral)  Resp 16  Ht 5\' 9"  (1.753 m)  Wt 199 lb 9.6 oz (90.538 kg)  BMI 29.46 kg/m2  SpO2 100%       Objective:   Physical Exam  Constitutional: He is oriented to person, place, and time. He appears well-developed and well-nourished. No distress.  HENT:  Head: Normocephalic and atraumatic.  Cardiovascular: Normal rate and regular rhythm.   No murmur heard. Pulmonary/Chest: Effort normal and breath sounds normal. No respiratory distress. He has no wheezes. He has no rales.  Musculoskeletal: He exhibits no edema.       Cervical back: He exhibits no tenderness.       Thoracic back: He exhibits no tenderness.       Lumbar back: He exhibits no tenderness.  Neurological: He is alert and oriented to person, place, and time.  Reflex Scores:      Patellar reflexes are 2+ on the right side and 2+ on the left side. Bilateral LE  strength is 5/5  Skin: Skin is warm and dry.  Psychiatric: He has a normal mood and affect. His behavior is normal. Thought content normal.          Assessment & Plan:  Low back pain- new. Trial of meloxicam, prn flexeril. Pt advised to call if symptoms worsen, if symptoms not improved in 1 week.

## 2015-03-06 ENCOUNTER — Encounter: Payer: Self-pay | Admitting: Internal Medicine

## 2015-03-16 ENCOUNTER — Telehealth: Payer: Self-pay | Admitting: *Deleted

## 2015-03-16 MED ORDER — LOSARTAN POTASSIUM 50 MG PO TABS: ORAL_TABLET | ORAL | Status: DC

## 2015-03-16 NOTE — Telephone Encounter (Signed)
Received fax from [facility] for refill of Losartan potassium 50mg , 1 tablet daily. Pt has f/u 03/20/15. Refills sent.

## 2015-03-20 ENCOUNTER — Encounter: Payer: Self-pay | Admitting: Family

## 2015-03-20 ENCOUNTER — Ambulatory Visit (INDEPENDENT_AMBULATORY_CARE_PROVIDER_SITE_OTHER): Payer: 59 | Admitting: Family

## 2015-03-20 VITALS — BP 136/82 | HR 63 | Temp 97.7°F | Ht 69.0 in | Wt 203.2 lb

## 2015-03-20 DIAGNOSIS — L309 Dermatitis, unspecified: Secondary | ICD-10-CM

## 2015-03-20 DIAGNOSIS — M545 Low back pain: Secondary | ICD-10-CM | POA: Diagnosis not present

## 2015-03-20 DIAGNOSIS — I10 Essential (primary) hypertension: Secondary | ICD-10-CM

## 2015-03-20 MED ORDER — BETAMETHASONE DIPROPIONATE 0.05 % EX CREA: TOPICAL_CREAM | Freq: Every day | CUTANEOUS | Status: DC

## 2015-03-20 NOTE — Assessment & Plan Note (Signed)
I rechecked BP and got BP: 136/82 mmHg  Continue losartan.  Obtain bmet.

## 2015-03-20 NOTE — Assessment & Plan Note (Signed)
Refill provided for diprolene.

## 2015-03-20 NOTE — Progress Notes (Signed)
Pre visit review using our clinic review tool, if applicable. No additional management support is needed unless otherwise documented below in the visit note. 

## 2015-03-20 NOTE — Patient Instructions (Addendum)
Please complete lab work prior to leaving. Follow up in 6 months for your complete physical.

## 2015-03-20 NOTE — Progress Notes (Signed)
Subjective:    Patient ID: Richard Henderson, male    DOB: 1960-06-09, 55 y.o.   MRN: [id number]  HPI  Richard Henderson is a 55 yr old male who presents today for follow up.  1) Back pain- resolved, no longer using flexeril.  2) HTN- not checking BP's at home.   BP Readings from Last 3 Encounters:  03/20/15 104/76  12/09/14 146/77  09/17/14 131/74   3) Eczema- hands, requests refill on diprolene.   Review of Systems  Constitutional: Negative for fever and fatigue.  Respiratory: Negative for shortness of breath and wheezing.   Cardiovascular: Negative for chest pain and leg swelling.  Gastrointestinal: Negative for nausea and diarrhea.  Neurological: Negative for headaches.       Past Medical History  Diagnosis Date  . Allergic rhinitis   . Hyperlipidemia   . Hypertension   . Colonic mass   . Eczema     on hands  . Sleep apnea     USES C-PAP  . Serrated adenoma of rectum s/p TEM resection 27Jun2013 07/18/2011    Social History   Social History  . Marital Status: Married    Spouse Name: N/A  . Number of Children: 3  . Years of Education: N/A   Occupational History  . Application Systems Analyst    Social History Main Topics  . Smoking status: Never Smoker   . Smokeless tobacco: Never Used  . Alcohol Use: No     Comment: Rare  . Drug Use: No  . Sexual Activity: Not on file   Other Topics Concern  . 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    Past Surgical History  Procedure Laterality Date  . Wisdom tooth extraction  2012  . Transanal endoscopic microsurgery  06/30/2011    Family History  Problem Relation Age of Onset  . Prostate cancer Father   . Alzheimer's disease Father   . Arthritis Other     No Known Allergies  Current Outpatient Prescriptions on File Prior to Visit  Medication Sig Dispense Refill  . atorvastatin (LIPITOR) 10 MG tablet  TAKE 1 TABLET BY MOUTH EVERY DAY 30 tablet 6  . losartan (COZAAR) 50 MG tablet TAKE 1 TABLET (50 MG TOTAL) BY MOUTH DAILY. 90 tablet 1  . methylPREDNISolone (MEDROL DOSEPAK) 4 MG TBPK tablet Take as directed 21 tablet 0  . aspirin EC 81 MG tablet Take 81 mg by mouth daily. Reported on 03/20/2015    . cyclobenzaprine (FLEXERIL) 5 MG tablet Take 1 tablet (5 mg total) by mouth 3 (three) times daily as needed for muscle spasms. (Patient not taking: Reported on 03/20/2015) 20 tablet 0   No current facility-administered medications on file prior to visit.    BP 104/76 mmHg  Pulse 63  Temp(Src) 97.7 F (36.5 C) (Oral)  Ht 5\' 9"  (1.753 m)  Wt 203 lb 3.2 oz (92.171 kg)  BMI 29.99 kg/m2  SpO2 99%     Objective:   Physical Exam  Constitutional: He is oriented to person, place, and time. He appears well-developed and well-nourished. No distress.  HENT:  Head: Normocephalic and atraumatic.  Cardiovascular: Normal rate and regular rhythm.   No murmur heard. Pulmonary/Chest: Effort normal and breath sounds normal. No respiratory distress. He has no wheezes. He has no rales.  Musculoskeletal: He  exhibits no edema.  Neurological: He is alert and oriented to person, place, and time.  Skin: Skin is warm and dry.  Some dry eczema changes noted on hands  Psychiatric: He has a normal mood and affect. His behavior is normal. Thought content normal.          Assessment & Plan:

## 2015-04-28 ENCOUNTER — Ambulatory Visit: Payer: 59 | Admitting: Acute Care

## 2015-04-30 ENCOUNTER — Telehealth: Payer: Self-pay | Admitting: Pulmonary Disease

## 2015-04-30 NOTE — Telephone Encounter (Signed)
Patient notified of compliance report results. Patient states that he was scheduled to see Dr. Elsworth Soho on 04/29/15 but it was in [place] office and not [facility] office, so he went to wrong office. When he got to [facility] office, he rescheduled appointment for [facility] office. Nothing further needed.

## 2015-04-30 NOTE — Telephone Encounter (Signed)
Per Dr. Elsworth Soho: Compliance report shows on auto - average pressure 14cm, no residuals, no leakage.  ------------------------------------------------

## 2015-05-05 ENCOUNTER — Encounter: Payer: Self-pay | Admitting: Pulmonary Disease

## 2015-05-21 ENCOUNTER — Encounter: Payer: Self-pay | Admitting: Internal Medicine

## 2015-05-21 ENCOUNTER — Ambulatory Visit (INDEPENDENT_AMBULATORY_CARE_PROVIDER_SITE_OTHER): Payer: 59 | Admitting: Adult Health

## 2015-05-21 ENCOUNTER — Encounter: Payer: Self-pay | Admitting: Adult Health

## 2015-05-21 VITALS — BP 151/83 | HR 62 | Temp 98.1°F | Ht 69.0 in | Wt 203.0 lb

## 2015-05-21 DIAGNOSIS — G4733 Obstructive sleep apnea (adult) (pediatric): Secondary | ICD-10-CM | POA: Diagnosis not present

## 2015-05-21 NOTE — Progress Notes (Signed)
Subjective:    Patient ID: Richard Henderson, male    DOB: [birth year], 55 y.o.   MRN: [id number]  HPI 55 yo male with severe OSA   TEST  PSG 03/2010 showed moderate obstructive sleep apnea - AHI 25 / hour -CPAP medium comfort gel mask 12 cm    05/21/2015 Follow up: OSA  Pt returns for 1 year follow up for sleep apnea.  Pt says he is doing very well on CPAP .  "cant live without it" . Feels rested.  Wears for ~6 hr each night. Needs chin strap, does snore on occasion use, bow tie to keep jaw lifted.  Download shows excellent compliance with avg usage at 6.5hr , AHI 3.2 .  Denies chest pain, orthopnea, edema or fever.    Past Medical History  Diagnosis Date  . Allergic rhinitis   . Hyperlipidemia   . Hypertension   . Colonic mass   . Eczema     on hands  . Sleep apnea     USES C-PAP  . Serrated adenoma of rectum s/p TEM resection 27Jun2013 07/18/2011   Current Outpatient Prescriptions on File Prior to Visit  Medication Sig Dispense Refill  . atorvastatin (LIPITOR) 10 MG tablet TAKE 1 TABLET BY MOUTH EVERY DAY 30 tablet 6  . betamethasone dipropionate (DIPROLENE) 0.05 % cream Apply topically daily. 30 g 2  . cetirizine (ZYRTEC) 5 MG tablet Take by mouth.    . cyclobenzaprine (FLEXERIL) 5 MG tablet Take 1 tablet (5 mg total) by mouth 3 (three) times daily as needed for muscle spasms. 20 tablet 0  . losartan (COZAAR) 50 MG tablet TAKE 1 TABLET (50 MG TOTAL) BY MOUTH DAILY. 90 tablet 1  . aspirin EC 81 MG tablet Take 81 mg by mouth daily. Reported on 05/21/2015     No current facility-administered medications on file prior to visit.      Review of Systems Constitutional:   No  weight loss, night sweats,  Fevers, chills, fatigue, or  lassitude.  HEENT:   No headaches,  Difficulty swallowing,  Tooth/dental problems, or  Sore throat,                No sneezing, itching, ear ache, nasal congestion, post nasal drip,   CV:  No chest pain,  Orthopnea, PND, swelling in lower  extremities, anasarca, dizziness, palpitations, syncope.   GI  No heartburn, indigestion, abdominal pain, nausea, vomiting, diarrhea, change in bowel habits, loss of appetite, bloody stools.   Resp: No shortness of breath with exertion or at rest.  No excess mucus, no productive cough,  No non-productive cough,  No coughing up of blood.  No change in color of mucus.  No wheezing.  No chest wall deformity  Skin: no rash or lesions.  GU: no dysuria, change in color of urine, no urgency or frequency.  No flank pain, no hematuria   MS:  No joint pain or swelling.  No decreased range of motion.  No back pain.  Psych:  No change in mood or affect. No depression or anxiety.  No memory loss.          Objective:   Physical Exam Filed Vitals:   05/21/15 1007  BP: 151/83  Pulse: 62  Temp: 98.1 F (36.7 C)  TempSrc: Oral  Height: 5\' 9"  (1.753 m)  Weight: 203 lb (92.08 kg)  SpO2: 98%   Body mass index is 29.96 kg/(m^2).  GEN: A/Ox3; pleasant , NAD, well nourished  HEENT:  [place]/AT,  EACs-clear, TMs-wnl, NOSE-clear, THROAT-clear, no lesions, no postnasal drip or exudate noted. Class 2 MP airway   NECK:  Supple w/ fair ROM; no JVD; normal carotid impulses w/o bruits; no thyromegaly or nodules palpated; no lymphadenopathy.  RESP  Clear  P & A; w/o, wheezes/ rales/ or rhonchi.no accessory muscle use, no dullness to percussion  CARD:  RRR, no m/r/g  , no peripheral edema, pulses intact, no cyanosis or clubbing.  GI:   Soft & nt; nml bowel sounds; no organomegaly or masses detected.  Musco: Warm bil, no deformities or joint swelling noted.   Neuro: alert, no focal deficits noted.    Skin: Warm, no lesions or rashes   Jillianna Stanek NP-C  [facility]  05/21/2015        Assessment & Plan:

## 2015-05-21 NOTE — Assessment & Plan Note (Signed)
Severe OSA compensated on CPAP   Plan  Order for Chin Strap to DME .  Continue on CPAP At bedtime   Remain active.  Do not drive if sleepy.  Follow up Dr. Elsworth Soho  In 1 year and As needed

## 2015-05-21 NOTE — Patient Instructions (Signed)
Order for [employer] to DME .  Continue on CPAP At bedtime   Remain active.  Do not drive if sleepy.  Follow up Dr. Elsworth Soho  In 1 year and As needed

## 2015-05-25 NOTE — Progress Notes (Signed)
Reviewed & agree with plan  

## 2015-06-25 ENCOUNTER — Ambulatory Visit (AMBULATORY_SURGERY_CENTER): Payer: Self-pay

## 2015-06-25 VITALS — Ht 69.0 in | Wt 207.0 lb

## 2015-06-25 DIAGNOSIS — Z86010 Personal history of colonic polyps: Secondary | ICD-10-CM

## 2015-06-25 MED ORDER — NA SULFATE-K SULFATE-MG SULF 17.5-3.13-1.6 GM/177ML PO SOLN: 1.0000 | Freq: Once | ORAL | Status: DC

## 2015-06-25 NOTE — Progress Notes (Signed)
No egg or soy allergy.  No previous complications from anesthesia. No home O2. No diet meds. 

## 2015-07-01 ENCOUNTER — Encounter: Payer: Self-pay | Admitting: Internal Medicine

## 2015-07-09 ENCOUNTER — Encounter: Payer: Self-pay | Admitting: Internal Medicine

## 2015-07-09 ENCOUNTER — Ambulatory Visit (AMBULATORY_SURGERY_CENTER): Payer: 59 | Admitting: Internal Medicine

## 2015-07-09 VITALS — BP 118/72 | HR 64 | Temp 98.4°F | Resp 10 | Ht 69.0 in | Wt 207.0 lb

## 2015-07-09 DIAGNOSIS — D124 Benign neoplasm of descending colon: Secondary | ICD-10-CM | POA: Diagnosis not present

## 2015-07-09 DIAGNOSIS — Z86010 Personal history of colonic polyps: Secondary | ICD-10-CM

## 2015-07-09 MED ORDER — SODIUM CHLORIDE 0.9 % IV SOLN: 500.0000 mL | INTRAVENOUS | Status: DC

## 2015-07-09 NOTE — Patient Instructions (Signed)
YOU HAD AN ENDOSCOPIC PROCEDURE TODAY AT THE [facility]:   Refer to the procedure report that was given to you for any specific questions about what was found during the examination.  If the procedure report does not answer your questions, please call your gastroenterologist to clarify.  If you requested that your care partner not be given the details of your procedure findings, then the procedure report has been included in a sealed envelope for you to review at your convenience later.  YOU SHOULD EXPECT: Some feelings of bloating in the abdomen. Passage of more gas than usual.  Walking can help get rid of the air that was put into your GI tract during the procedure and reduce the bloating. If you had a lower endoscopy (such as a colonoscopy or flexible sigmoidoscopy) you may notice spotting of blood in your stool or on the toilet paper. If you underwent a bowel prep for your procedure, you may not have a normal bowel movement for a few days.  Please Note:  You might notice some irritation and congestion in your nose or some drainage.  This is from the oxygen used during your procedure.  There is no need for concern and it should clear up in a day or so.  SYMPTOMS TO REPORT IMMEDIATELY:   Following lower endoscopy (colonoscopy or flexible sigmoidoscopy):  Excessive amounts of blood in the stool  Significant tenderness or worsening of abdominal pains  Swelling of the abdomen that is new, acute  Fever of 100F or higher    For urgent or emergent issues, a gastroenterologist can be reached at any hour by calling [phone].   DIET: Your first meal following the procedure should be a small meal and then it is ok to progress to your normal diet. Heavy or fried foods are harder to digest and may make you feel nauseous or bloated.  Likewise, meals heavy in dairy and vegetables can increase bloating.  Drink plenty of fluids but you should avoid alcoholic beverages for 24  hours.  ACTIVITY:  You should plan to take it easy for the rest of today and you should NOT DRIVE or use heavy machinery until tomorrow (because of the sedation medicines used during the test).    FOLLOW UP: Our staff will call the number listed on your records the next business day following your procedure to check on you and address any questions or concerns that you may have regarding the information given to you following your procedure. If we do not reach you, we will leave a message.  However, if you are feeling well and you are not experiencing any problems, there is no need to return our call.  We will assume that you have returned to your regular daily activities without incident.  If any biopsies were taken you will be contacted by phone or by letter within the next 1-3 weeks.  Please call us at [phone] if you have not heard about the biopsies in 3 weeks.    SIGNATURES/CONFIDENTIALITY: You and/or your care partner have signed paperwork which will be entered into your electronic medical record.  These signatures attest to the fact that that the information above on your After Visit Summary has been reviewed and is understood.  Full responsibility of the confidentiality of this discharge information lies with you and/or your care-partner.  Polyp, diverticulosis, high fiber diet information given. 

## 2015-07-09 NOTE — Progress Notes (Signed)
To recovery, report to Scott, RN, VSS 

## 2015-07-09 NOTE — Progress Notes (Signed)
Called to room to assist during endoscopic procedure.  Patient ID and intended procedure confirmed with present staff. Received instructions for my participation in the procedure from the performing physician.  

## 2015-07-09 NOTE — Op Note (Signed)
[place] Patient Name: Richard Henderson Procedure Date: 07/09/2015 10:44 AM MRN: [id number] Endoscopist: Jerene Bears , MD Age: 55 Referring MD:  Date of Birth: 1960-05-09 Gender: Male Account #: [id number] Procedure:                Colonoscopy Indications:              High risk colon cancer surveillance: Personal                            history of traditional serrated adenoma of the                            rectum in 2013, s/p partial proctectomy by TEM,                            Last colonoscopy 3 years ago Medicines:                Monitored Anesthesia Care Procedure:                Pre-Anesthesia Assessment:                           - Prior to the procedure, a History and Physical                            was performed, and patient medications and                            allergies were reviewed. The patient's tolerance of                            previous anesthesia was also reviewed. The risks                            and benefits of the procedure and the sedation                            options and risks were discussed with the patient.                            All questions were answered, and informed consent                            was obtained. Prior Anticoagulants: The patient has                            taken no previous anticoagulant or antiplatelet                            agents. ASA Grade Assessment: II - A patient with                            mild systemic disease. After reviewing the risks  and benefits, the patient was deemed in                            satisfactory condition to undergo the procedure.                           After obtaining informed consent, the colonoscope                            was passed under direct vision. Throughout the                            procedure, the patient's blood pressure, pulse, and                            oxygen saturations were monitored  continuously. The                            Model CF-HQ190L [id number]) scope was introduced                            through the anus and advanced to the the cecum,                            identified by appendiceal orifice and ileocecal                            valve. The colonoscopy was performed without                            difficulty. The patient tolerated the procedure                            well. The quality of the bowel preparation was                            excellent. The ileocecal valve, appendiceal                            orifice, and rectum were photographed. Scope In: 10:57:02 AM Scope Out: 11:09:27 AM Scope Withdrawal Time: 0 hours 10 minutes 59 seconds  Total Procedure Duration: 0 hours 12 minutes 25 seconds  Findings:                 The digital rectal exam findings include evidence                            of prior intervention, but no masses.                           A 4 mm polyp was found in the descending colon. The                            polyp was sessile. The polyp was removed with a  cold snare. Resection and retrieval were complete.                           A few small-mouthed diverticula were found in the                            sigmoid colon.                           There was evidence of a prior surgical intervention                            in the rectum. This was characterized by an intact                            appearance without evidence of residual polyp.                           No additional abnormalities were found on                            retroflexion. Complications:            No immediate complications. Estimated Blood Loss:     Estimated blood loss: none. Impression:               - One 4 mm polyp in the descending colon, removed                            with a cold snare. Resected and retrieved.                           - Diverticulosis in the sigmoid colon.                            - Prior surgical intervention in the rectum,                            characterized by a healthy and intact appearance                            without residual polyp. Recommendation:           - Patient has a contact number available for                            emergencies. The signs and symptoms of potential                            delayed complications were discussed with the                            patient. Return to normal activities tomorrow.                            Written discharge instructions were provided to  the                            patient.                           - Resume previous diet.                           - Continue present medications.                           - Await pathology results.                           - Repeat colonoscopy in 5 years for surveillance. Jerene Bears, MD 07/09/2015 11:21:46 AM This report has been signed electronically.

## 2015-07-10 ENCOUNTER — Telehealth: Payer: Self-pay | Admitting: *Deleted

## 2015-07-10 NOTE — Telephone Encounter (Signed)
  Follow up Call-  Call back number 07/09/2015  Post procedure Call Back phone  # [phone]  Permission to leave phone message Yes     Patient questions:  Do you have a fever, pain , or abdominal swelling? No. Pain Score  0 *  Have you tolerated food without any problems? Yes.    Have you been able to return to your normal activities? Yes.    Do you have any questions about your discharge instructions: Diet   No. Medications  No. Follow up visit  No.  Do you have questions or concerns about your Care? No.  Actions: * If pain score is 4 or above: No action needed, pain <4.

## 2015-07-13 ENCOUNTER — Other Ambulatory Visit: Payer: Self-pay | Admitting: Family

## 2015-07-20 ENCOUNTER — Encounter: Payer: Self-pay | Admitting: Internal Medicine

## 2015-09-23 ENCOUNTER — Telehealth: Payer: Self-pay | Admitting: Family

## 2015-09-23 NOTE — Telephone Encounter (Signed)
I received some lab results dated 8/24 from [facility].  Potassium was elevated. I would like him to repeat bmet, dx hyperkalemia to recheck.

## 2015-09-23 NOTE — Telephone Encounter (Signed)
Notified pt and he voices understanding. Has appt on 09/25/15 and per verbal from PCP ok to recheck at that visit.

## 2015-09-25 ENCOUNTER — Ambulatory Visit (INDEPENDENT_AMBULATORY_CARE_PROVIDER_SITE_OTHER): Payer: 59 | Admitting: Family

## 2015-09-25 ENCOUNTER — Encounter: Payer: Self-pay | Admitting: Family

## 2015-09-25 VITALS — BP 122/70 | HR 61 | Temp 98.3°F | Resp 16 | Ht 69.0 in | Wt 207.0 lb

## 2015-09-25 DIAGNOSIS — Z23 Encounter for immunization: Secondary | ICD-10-CM | POA: Diagnosis not present

## 2015-09-25 DIAGNOSIS — Z0000 Encounter for general adult medical examination without abnormal findings: Secondary | ICD-10-CM | POA: Diagnosis not present

## 2015-09-25 LAB — BASIC METABOLIC PANEL
BUN: 18 mg/dL (ref 6–23)
CO2: 30 mEq/L (ref 19–32)
Calcium: 9 mg/dL (ref 8.4–10.5)
Chloride: 106 mEq/L (ref 96–112)
Creatinine, Ser: 0.98 mg/dL (ref 0.40–1.50)
GFR: 84.26 mL/min (ref >60.00)
Glucose, Bld: 109 mg/dL — ABNORMAL HIGH (ref 70–99)
Potassium: 4.3 mEq/L (ref 3.5–5.1)
Sodium: 141 mEq/L (ref 135–145)

## 2015-09-25 NOTE — Progress Notes (Signed)
Subjective:    Patient ID: Richard Henderson, male    DOB: [birth year], 55 y.o.   MRN: [id number]  HPI  Patient presents today for complete physical.  Immunizations: flu shot Diet: diet is fair,  Exercise: soccer ref/jogs Colonoscopy: 2017-polyp will need 5 year repeat  Review of Systems  HENT: Negative for hearing loss and rhinorrhea.   Eyes: Negative for visual disturbance.  Respiratory: Negative for cough and shortness of breath.   Cardiovascular: Negative for chest pain.       Mild ankle swelling after running.   Gastrointestinal: Negative for constipation and diarrhea.  Genitourinary: Negative for dysuria and frequency.  Musculoskeletal: Negative for myalgias.       Some ankle/knee pain with running only  Skin: Negative for rash.  Neurological: Negative for headaches.  Hematological: Negative for adenopathy.  Psychiatric/Behavioral:       Denies depression/anxiety   Past Medical History:  Diagnosis Date  . Allergic rhinitis   . Cancer [facility]) 2013   colon cancer  . Colonic mass   . Eczema    on hands  . GERD (gastroesophageal reflux disease)    none since cpap  . Hyperlipidemia   . Hypertension   . Serrated adenoma of rectum s/p TEM resection 27Jun2013 07/18/2011  . Sleep apnea    USES C-PAP     Social History   Social History  . Marital status: Married    Spouse name: N/A  . Number of children: 3  . Years of education: N/A   Occupational History  . Application Systems Analyst    Social History Main Topics  . Smoking status: Never Smoker  . Smokeless tobacco: Never Used  . Alcohol use 0.0 oz/week     Comment: 1  . Drug use: No  . Sexual activity: Not on file   Other Topics Concern  . 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    Past Surgical History:  Procedure Laterality Date  . COLONOSCOPY  2013  . POLYPECTOMY    . TRANSANAL ENDOSCOPIC  MICROSURGERY  06/30/2011  . WISDOM TOOTH EXTRACTION  2012    Family History  Problem Relation Age of Onset  . Prostate cancer Father   . Alzheimer's disease Father   . Arthritis Other   . Colon cancer Neg Hx     No Known Allergies  Current Outpatient Prescriptions on File Prior to Visit  Medication Sig Dispense Refill  . atorvastatin (LIPITOR) 10 MG tablet TAKE 1 TABLET BY MOUTH EVERY DAY 30 tablet 2  . losartan (COZAAR) 50 MG tablet TAKE 1 TABLET (50 MG TOTAL) BY MOUTH DAILY. 90 tablet 1   No current facility-administered medications on file prior to visit.     BP 122/70 (BP Location: Right Arm, Cuff Size: Normal)   Pulse 61   Temp 98.3 F (36.8 C) (Oral)   Resp 16   Ht 5\' 9"  (1.753 m)   Wt 207 lb (93.9 kg)   SpO2 98% Comment: room air  BMI 30.57 kg/m        Objective:   Physical Exam Physical Exam  Constitutional: He is oriented to person, place, and time. He appears well-developed and well-nourished. No distress.  HENT:  Head: Normocephalic and atraumatic.  Right Ear: Tympanic membrane and ear canal normal.  Left Ear: Tympanic membrane and ear  canal normal.  Mouth/Throat: Oropharynx is clear and moist.  Eyes: Pupils are equal, round, and reactive to light. No scleral icterus.  Neck: Normal range of motion. No thyromegaly present.  Cardiovascular: Normal rate and regular rhythm.   No murmur heard. Pulmonary/Chest: Effort normal and breath sounds normal. No respiratory distress. He has no wheezes. He has no rales. He exhibits no tenderness.  Abdominal: Soft. Bowel sounds are normal. He exhibits no distension and no mass. There is no tenderness. There is no rebound and no guarding.  Musculoskeletal: He exhibits no edema.  Lymphadenopathy:    He has no cervical adenopathy.  Neurological: He is alert and oriented to person, place, and time. He has normal patellar reflexes. He exhibits normal muscle tone. Coordination normal.  Skin: Skin is warm and dry.    Psychiatric: He has a normal mood and affect. His behavior is normal. Judgment and thought content normal.          Assessment & Plan:   Preventative care- discussed healthy diet, exercise, weight loss. Obtain routine labs as well as bmet since K+ was elevated in his screening labs at his work. Flu shot today.       Assessment & Plan:  EKG tracing is personally reviewed.  EKG notes NSR.  No acute changes.

## 2015-09-25 NOTE — Progress Notes (Signed)
Pre visit review using our clinic review tool, if applicable. No additional management support is needed unless otherwise documented below in the visit note. 

## 2015-09-25 NOTE — Patient Instructions (Signed)
Please complete lab work prior to leaving. Continue to work on healthy diet, exercise and weight loss.  

## 2015-09-26 LAB — HEPATITIS C ANTIBODY: HCV Ab: NEGATIVE

## 2015-10-19 ENCOUNTER — Other Ambulatory Visit: Payer: Self-pay | Admitting: Family

## 2015-12-11 ENCOUNTER — Ambulatory Visit (INDEPENDENT_AMBULATORY_CARE_PROVIDER_SITE_OTHER): Payer: 59 | Admitting: Family

## 2015-12-11 ENCOUNTER — Encounter: Payer: Self-pay | Admitting: Family

## 2015-12-11 VITALS — BP 141/68 | HR 66 | Temp 98.7°F | Resp 16 | Ht 69.0 in | Wt 207.6 lb

## 2015-12-11 DIAGNOSIS — S46912A Strain of unspecified muscle, fascia and tendon at shoulder and upper arm level, left arm, initial encounter: Secondary | ICD-10-CM

## 2015-12-11 DIAGNOSIS — S39012A Strain of muscle, fascia and tendon of lower back, initial encounter: Secondary | ICD-10-CM | POA: Diagnosis not present

## 2015-12-11 MED ORDER — MELOXICAM 7.5 MG PO TABS: 7.5000 mg | ORAL_TABLET | Freq: Every day | ORAL | 0 refills | Status: DC

## 2015-12-11 NOTE — Progress Notes (Signed)
Subjective:    Patient ID: Richard Henderson, male    DOB: [birth year], 55 y.o.   MRN: [id number]  HPI  Richard Henderson is a 55 yr old male who presents today to discuss two injuries:   Reports that he twisted his lower back about 10 days ago. Took ibuprofen 600mg  daily for 3 days.  This is improving. Back pain was non-radiating.  Pain is 1/10 pain today.  Last Thursday he injured his left shoulder. Not sure how it happened.  Shoulder pain is 2-3/10, but up to 7 with certain motions.   Review of Systems See HPI  Past Medical History:  Diagnosis Date  . Allergic rhinitis   . Cancer [facility]) 2013   colon cancer  . Colonic mass   . Eczema    on hands  . GERD (gastroesophageal reflux disease)    none since cpap  . Hyperlipidemia   . Hypertension   . Serrated adenoma of rectum s/p TEM resection 27Jun2013 07/18/2011  . Sleep apnea    USES C-PAP     Social History   Social History  . Marital status: Married    Spouse name: N/A  . Number of children: 3  . Years of education: N/A   Occupational History  . Application Systems Analyst    Social History Main Topics  . Smoking status: Never Smoker  . Smokeless tobacco: Never Used  . Alcohol use 0.0 oz/week     Comment: 1  . Drug use: No  . Sexual activity: Not on file   Other Topics Concern  . 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    Past Surgical History:  Procedure Laterality Date  . COLONOSCOPY  2013  . POLYPECTOMY    . TRANSANAL ENDOSCOPIC MICROSURGERY  06/30/2011  . WISDOM TOOTH EXTRACTION  2012    Family History  Problem Relation Age of Onset  . Prostate cancer Father   . Alzheimer's disease Father   . Arthritis Other   . Colon cancer Neg Hx     No Known Allergies  Current Outpatient Prescriptions on File Prior to Visit  Medication Sig Dispense Refill  . atorvastatin (LIPITOR) 10 MG tablet TAKE 1 TABLET  BY MOUTH EVERY DAY 30 tablet 2  . losartan (COZAAR) 50 MG tablet TAKE 1 TABLET (50 MG TOTAL) BY MOUTH DAILY. 90 tablet 1   No current facility-administered medications on file prior to visit.     BP (!) 141/68   Pulse 66   Temp 98.7 F (37.1 C) (Oral)   Resp 16   Ht 5\' 9"  (1.753 m)   Wt 207 lb 9.6 oz (94.2 kg)   SpO2 99%   BMI 30.66 kg/m       Objective:   Physical Exam  Constitutional: He is oriented to person, place, and time. He appears well-developed and well-nourished. No distress.  HENT:  Head: Normocephalic and atraumatic.  Cardiovascular: Normal rate and regular rhythm.   No murmur heard. Pulmonary/Chest: Effort normal and breath sounds normal. No respiratory distress. He has no wheezes. He has no rales.  Musculoskeletal: He exhibits no edema.       Cervical back: He exhibits no tenderness.       Thoracic back: He exhibits no tenderness.       Lumbar back: He exhibits no tenderness.  L shoulder with full passive/active ROM, non-tender, no swelling. + pain with left shoulder adduction.    Neurological: He is alert and oriented to person, place, and time. He exhibits normal muscle tone.  Reflex Scores:      Patellar reflexes are 2+ on the right side and 2+ on the left side. Skin: Skin is warm and dry.  Psychiatric: He has a normal mood and affect. His behavior is normal. Thought content normal.          Assessment & Plan:  Left shoulder strain- new. rx with short course of meloxicam. If symptoms worsen or do not improve in 1-2 weeks, will plan referral to sports medicin.  Lumbar strain- improving, should continue to improve with meloxicam.

## 2015-12-11 NOTE — Progress Notes (Signed)
Pre visit review using our clinic review tool, if applicable. No additional management support is needed unless otherwise documented below in the visit note. 

## 2015-12-11 NOTE — Patient Instructions (Addendum)
Please begin meloxicam.  Call if your back pain/shoulder pain worsen or if they do not improve in 1-2 weeks.

## 2015-12-15 ENCOUNTER — Other Ambulatory Visit: Payer: Self-pay | Admitting: Family

## 2016-01-14 ENCOUNTER — Other Ambulatory Visit: Payer: Self-pay | Admitting: Family

## 2016-03-11 ENCOUNTER — Encounter: Payer: Self-pay | Admitting: Family

## 2016-03-11 ENCOUNTER — Ambulatory Visit (INDEPENDENT_AMBULATORY_CARE_PROVIDER_SITE_OTHER): Payer: 59 | Admitting: Family

## 2016-03-11 VITALS — BP 134/70 | HR 71 | Temp 98.7°F | Resp 16 | Ht 69.0 in | Wt 212.4 lb

## 2016-03-11 DIAGNOSIS — B070 Plantar wart: Secondary | ICD-10-CM

## 2016-03-11 DIAGNOSIS — J302 Other seasonal allergic rhinitis: Secondary | ICD-10-CM | POA: Diagnosis not present

## 2016-03-11 DIAGNOSIS — I10 Essential (primary) hypertension: Secondary | ICD-10-CM

## 2016-03-11 DIAGNOSIS — E785 Hyperlipidemia, unspecified: Secondary | ICD-10-CM | POA: Diagnosis not present

## 2016-03-11 NOTE — Assessment & Plan Note (Signed)
Stable on zyrtec, flonase. Continue same.

## 2016-03-11 NOTE — Progress Notes (Signed)
Subjective:    Patient ID: Richard Henderson, male    DOB: [birth year], 56 y.o.   MRN: [id number]  HPI  Richard Henderson is a 56 yr old male who presents today for follow up.  HTN- maintained on losartan. Denies CP/SOB/swelling. BP Readings from Last 3 Encounters:  03/11/16 134/70  12/11/15 (!) 141/68  09/25/15 122/70   Hyperlipidemia- maintained on lipitor 10mg . Denies myalgia.  Lab Results  Component Value Date   CHOL 145 09/17/2014   HDL 43.00 09/17/2014   LDLCALC 88 09/17/2014   TRIG 70.0 09/17/2014   CHOLHDL 3 09/17/2014   Wt Readings from Last 3 Encounters:  03/11/16 212 lb 6.4 oz (96.3 kg)  12/11/15 207 lb 9.6 oz (94.2 kg)  09/25/15 207 lb (93.9 kg)   Allergic Rhinitis- started zyrtec, and flonase about 3 weeks ago.   He notes "callous" on the inside of his left great toe.   Review of Systems See HPI  Past Medical History:  Diagnosis Date  . Allergic rhinitis   . Cancer [facility]) 2013   colon cancer  . Colonic mass   . Eczema    on hands  . GERD (gastroesophageal reflux disease)    none since cpap  . Hyperlipidemia   . Hypertension   . Serrated adenoma of rectum s/p TEM resection 27Jun2013 07/18/2011  . Sleep apnea    USES C-PAP     Social History   Social History  . Marital status: Married    Spouse name: N/A  . Number of children: 3  . Years of education: N/A   Occupational History  . Application Systems Analyst    Social History Main Topics  . Smoking status: Never Smoker  . Smokeless tobacco: Never Used  . Alcohol use 0.0 oz/week     Comment: 1  . Drug use: No  . Sexual activity: Not on file   Other Topics Concern  . 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    Past Surgical History:  Procedure Laterality Date  . COLONOSCOPY  2013  . POLYPECTOMY    . TRANSANAL ENDOSCOPIC MICROSURGERY  06/30/2011  . WISDOM TOOTH EXTRACTION  2012     Family History  Problem Relation Age of Onset  . Prostate cancer Father   . Alzheimer's disease Father   . Arthritis Other   . Colon cancer Neg Hx     No Known Allergies  Current Outpatient Prescriptions on File Prior to Visit  Medication Sig Dispense Refill  . atorvastatin (LIPITOR) 10 MG tablet TAKE 1 TABLET BY MOUTH EVERY DAY 30 tablet 2  . fluticasone (FLONASE) 50 MCG/ACT nasal spray Place 2 sprays into both nostrils daily as needed for allergies or rhinitis.    [name] losartan (COZAAR) 50 MG tablet TAKE 1 TABLET BY MOUTH DAILY *MAX PER INSURANCE* 90 tablet 1  . Multiple Vitamin (MULTIVITAMIN) tablet Take 1 tablet by mouth daily as needed.      No current facility-administered medications on file prior to visit.     BP 134/70 (BP Location: Right Arm, Patient Position: Sitting, Cuff Size: Large)   Pulse 71   Temp 98.7 F (37.1 C) (Oral)   Resp 16   Ht 5\' 9"  (1.753 m)   Wt 212 lb 6.4 oz (96.3 kg)   SpO2 97%   BMI 31.37 kg/m  Objective:   Physical Exam  Constitutional: He is oriented to person, place, and time. He appears well-developed and well-nourished. No distress.  HENT:  Head: Normocephalic and atraumatic.  Cardiovascular: Normal rate and regular rhythm.   No murmur heard. Pulmonary/Chest: Effort normal and breath sounds normal. No respiratory distress. He has no wheezes. He has no rales.  Musculoskeletal: He exhibits no edema.  Neurological: He is alert and oriented to person, place, and time.  Skin: Skin is warm and dry.  Thickened plantars wart noted on left great toe, lateral base of toe, smaller wart noted adjacent.   Psychiatric: He has a normal mood and affect. His behavior is normal. Thought content normal.          Assessment & Plan:  Plantar wart- large wart debrided. Both warts frozen with liquid nitrogen (freeze thaw x 3) pt tolerated procedure well. Advised pt to apply salicylic acid twice daily to the affected areas and cover with duct  tape. Can follow up in 1 month for repeat freeze treatment if necessary.

## 2016-03-11 NOTE — Patient Instructions (Signed)
Apply wart remover acid (salicylic acid) once daily to warts and then cover with duct tape.

## 2016-03-11 NOTE — Assessment & Plan Note (Signed)
Stable on losartan. Continue same.

## 2016-03-11 NOTE — Progress Notes (Signed)
Pre visit review using our clinic review tool, if applicable. No additional management support is needed unless otherwise documented below in the visit note. 

## 2016-03-11 NOTE — Assessment & Plan Note (Signed)
Stable on statin, continue same.  

## 2016-03-25 ENCOUNTER — Ambulatory Visit: Payer: 59 | Admitting: Family

## 2016-04-15 ENCOUNTER — Encounter: Payer: Self-pay | Admitting: Family

## 2016-04-15 ENCOUNTER — Ambulatory Visit (INDEPENDENT_AMBULATORY_CARE_PROVIDER_SITE_OTHER): Payer: 59 | Admitting: Family

## 2016-04-15 VITALS — BP 135/75 | HR 60 | Temp 98.6°F | Resp 16 | Ht 69.0 in | Wt 207.6 lb

## 2016-04-15 DIAGNOSIS — B070 Plantar wart: Secondary | ICD-10-CM | POA: Diagnosis not present

## 2016-04-15 NOTE — Patient Instructions (Signed)
Continue to apply salicylic acid once daily and cover with duct tape. Follow back up in 3-4 weeks for retreatment.

## 2016-04-15 NOTE — Progress Notes (Signed)
   Subjective:    Patient ID: ALECZANDER FANDINO, male    DOB: [birth year], 56 y.o.   MRN: [id number]  HPI   Mr. Grealish is a 56 yr old male who presents today for follow up of his plantar wart. He has been applying salicylic acid daily and covering with duct tape.    Review of Systems     Objective:   Physical Exam          Assessment & Plan:  Plantar warts- warts sharply debrided and base of each wart was frozen x 3 using liquid nitrogen. Pt tolerated procedure without difficulty.  Patient advised to continue to apply salicylic acid once daily and cover with duct tape. Follow back up in 3-4 weeks for retreatment.

## 2016-04-15 NOTE — Progress Notes (Signed)
Pre visit review using our clinic review tool, if applicable. No additional management support is needed unless otherwise documented below in the visit note. 

## 2016-05-13 ENCOUNTER — Ambulatory Visit (INDEPENDENT_AMBULATORY_CARE_PROVIDER_SITE_OTHER): Payer: 59 | Admitting: Family

## 2016-05-13 ENCOUNTER — Encounter: Payer: Self-pay | Admitting: Family

## 2016-05-13 VITALS — BP 134/73 | HR 59 | Temp 98.0°F | Resp 16 | Ht 69.0 in | Wt 206.6 lb

## 2016-05-13 DIAGNOSIS — B070 Plantar wart: Secondary | ICD-10-CM | POA: Diagnosis not present

## 2016-05-13 NOTE — Progress Notes (Signed)
Pre visit review using our clinic review tool, if applicable. No additional management support is needed unless otherwise documented below in the visit note. 

## 2016-05-13 NOTE — Progress Notes (Signed)
   Subjective:    Patient ID: Richard Henderson, male    DOB: September 27, 1960, 56 y.o.   MRN: [id number]  HPI  Richard Henderson is a 55 yr old male who presents today for follow up of plantar wart.  He continues to apply acid and duct tape daily   Review of Systems    see HPI   Objective:   Physical Exam  Constitutional: He appears well-developed and well-nourished. No distress.  Skin: Skin is warm and dry.  Mosaic wart noted on left great toe medially (2 separate warts)          Assessment & Plan:  Plantar warts- after verbal consent was obtained, warts were debrided with sterile blade.  2 warts frozen with liquid nitrogen (freeze/thaw x 3) Pt tolerated procedure well. Continue to apply compound W daily and cover with duct tape

## 2016-05-13 NOTE — Patient Instructions (Signed)
Continue Compound W and duct tape to plantar's warts once daily.

## 2016-06-03 ENCOUNTER — Ambulatory Visit (INDEPENDENT_AMBULATORY_CARE_PROVIDER_SITE_OTHER): Payer: 59 | Admitting: Family

## 2016-06-03 ENCOUNTER — Encounter: Payer: Self-pay | Admitting: Family

## 2016-06-03 VITALS — BP 137/72 | HR 56 | Temp 98.0°F | Resp 16 | Ht 69.0 in | Wt 210.0 lb

## 2016-06-03 DIAGNOSIS — B070 Plantar wart: Secondary | ICD-10-CM | POA: Diagnosis not present

## 2016-06-03 NOTE — Progress Notes (Signed)
Subjective:    Patient ID: Richard Henderson, male    DOB: 1960/09/17, 56 y.o.   MRN: [id number]  HPI  [name] is a 56 yr old male who presents today for follow up of his plantar wart.  He continues to apply daily salicylic acid treatment.    Review of Systems    see HPI  Past Medical History:  Diagnosis Date  . Allergic rhinitis   . Cancer [facility]) 2013   colon cancer  . Colonic mass   . Eczema    on hands  . GERD (gastroesophageal reflux disease)    none since cpap  . Hyperlipidemia   . Hypertension   . Serrated adenoma of rectum s/p TEM resection 27Jun2013 07/18/2011  . Sleep apnea    USES C-PAP     Social History   Social History  . Marital status: Married    Spouse name: N/A  . Number of children: 3  . Years of education: N/A   Occupational History  . Application Systems Analyst    Social History Main Topics  . Smoking status: Never Smoker  . Smokeless tobacco: Never Used  . Alcohol use 0.0 oz/week     Comment: 1  . Drug use: No  . Sexual activity: Not on file   Other Topics Concern  . 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    Past Surgical History:  Procedure Laterality Date  . COLONOSCOPY  2013  . POLYPECTOMY    . TRANSANAL ENDOSCOPIC MICROSURGERY  06/30/2011  . WISDOM TOOTH EXTRACTION  2012    Family History  Problem Relation Age of Onset  . Prostate cancer Father   . Alzheimer's disease Father   . Arthritis Other   . Colon cancer Neg Hx     No Known Allergies  Current Outpatient Prescriptions on File Prior to Visit  Medication Sig Dispense Refill  . atorvastatin (LIPITOR) 10 MG tablet TAKE 1 TABLET BY MOUTH EVERY DAY 30 tablet 2  . fluticasone (FLONASE) 50 MCG/ACT nasal spray Place 2 sprays into both nostrils daily as needed for allergies or rhinitis.    [name] losartan (COZAAR) 50 MG tablet TAKE 1 TABLET BY MOUTH DAILY *MAX PER  INSURANCE* 90 tablet 1  . Multiple Vitamin (MULTIVITAMIN) tablet Take 1 tablet by mouth daily as needed.      No current facility-administered medications on file prior to visit.     BP 137/72 (BP Location: Right Arm, Cuff Size: Normal)   Pulse (!) 56   Temp 98 F (36.7 C) (Oral)   Resp 16   Ht 5\' 9"  (1.753 m)   Wt 210 lb (95.3 kg)   SpO2 99%   BMI 31.01 kg/m    Objective:   Physical Exam  Constitutional: He is oriented to person, place, and time. He appears well-developed and well-nourished.  Neurological: He is alert and oriented to person, place, and time.  Skin:  Plantar wart base of left great toe (improved) Smaller adjacent wart appears nearly resolved.   Psychiatric: He has a normal mood and affect. His behavior is normal. Judgment and thought content normal.          Assessment & Plan:  Plantar wart- larger wart debrided and then both warts were frozen with liquid nitrogen.  Pt tolerated procedure well. Advised pt to  follow up in 3-4 weeks for retreatment.  He is to continue daily application of salicylic acid and duct tape.

## 2016-06-23 ENCOUNTER — Encounter: Payer: Self-pay | Admitting: Family

## 2016-06-23 ENCOUNTER — Ambulatory Visit (INDEPENDENT_AMBULATORY_CARE_PROVIDER_SITE_OTHER): Payer: 59 | Admitting: Family

## 2016-06-23 VITALS — BP 128/71 | HR 54 | Temp 98.4°F | Resp 16 | Ht 69.0 in | Wt 208.0 lb

## 2016-06-23 DIAGNOSIS — B070 Plantar wart: Secondary | ICD-10-CM

## 2016-06-23 DIAGNOSIS — I10 Essential (primary) hypertension: Secondary | ICD-10-CM | POA: Diagnosis not present

## 2016-06-23 NOTE — Patient Instructions (Addendum)
Please continue applying salicylic acid and duct tape to wart once daily. Complete lab work prior to leaving.

## 2016-06-23 NOTE — Progress Notes (Signed)
Subjective:    Patient ID: Richard Henderson, male    DOB: 18-Jan-1960, 56 y.o.   MRN: [id number]  HPI  Mr. Miano is a 56 yr old male who presents today for treatment of his plantar wart. He continues to apply salicylic acid and duct tape daily.    Review of Systems See HPI  Past Medical History:  Diagnosis Date  . Allergic rhinitis   . Cancer [facility]) 2013   colon cancer  . Colonic mass   . Eczema    on hands  . GERD (gastroesophageal reflux disease)    none since cpap  . Hyperlipidemia   . Hypertension   . Serrated adenoma of rectum s/p TEM resection 27Jun2013 07/18/2011  . Sleep apnea    USES C-PAP     Social History   Social History  . Marital status: Married    Spouse name: N/A  . Number of children: 3  . Years of education: N/A   Occupational History  . Application Systems Analyst    Social History Main Topics  . Smoking status: Never Smoker  . Smokeless tobacco: Never Used  . Alcohol use 0.0 oz/week     Comment: 1  . Drug use: No  . Sexual activity: Not on file   Other Topics Concern  . 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    Past Surgical History:  Procedure Laterality Date  . COLONOSCOPY  2013  . POLYPECTOMY    . TRANSANAL ENDOSCOPIC MICROSURGERY  06/30/2011  . WISDOM TOOTH EXTRACTION  2012    Family History  Problem Relation Age of Onset  . Prostate cancer Father   . Alzheimer's disease Father   . Arthritis Other   . Colon cancer Neg Hx     No Known Allergies  Current Outpatient Prescriptions on File Prior to Visit  Medication Sig Dispense Refill  . atorvastatin (LIPITOR) 10 MG tablet TAKE 1 TABLET BY MOUTH EVERY DAY 30 tablet 2  . cetirizine (ZYRTEC) 10 MG tablet Take 10 mg by mouth daily.    . fluticasone (FLONASE) 50 MCG/ACT nasal spray Place 2 sprays into both nostrils daily as needed for allergies or rhinitis.    [name]  losartan (COZAAR) 50 MG tablet TAKE 1 TABLET BY MOUTH DAILY *MAX PER INSURANCE* 90 tablet 1  . Multiple Vitamin (MULTIVITAMIN) tablet Take 1 tablet by mouth daily as needed.     . TURMERIC PO Take 1 capsule by mouth daily.     No current facility-administered medications on file prior to visit.     BP 128/71 (BP Location: Right Arm, Cuff Size: Normal)   Pulse (!) 54   Temp 98.4 F (36.9 C) (Oral)   Resp 16   Ht 5\' 9"  (1.753 m)   Wt 208 lb (94.3 kg)   SpO2 99%   BMI 30.72 kg/m  Objective:   Physical Exam  Constitutional: He appears well-developed and well-nourished.  Skin: Skin is warm and dry.  Dry thickened skin noted on left great toe overlying wart.           Assessment & Plan:  Plantar wart- after verbal consent, thickened skin is debrided from the plantar wart.  Wart is frozen x 3 using liquid nitrogen.   It appears that the plantar wart is nearly resolved. At this point I don't think he should need any further freeze treatments.  He is instructed to continue salicylic acid and duct tape once daily.

## 2016-06-24 ENCOUNTER — Other Ambulatory Visit: Payer: 59

## 2016-06-24 LAB — BASIC METABOLIC PANEL
BUN: 20 mg/dL (ref 6–23)
CO2: 29 mEq/L (ref 19–32)
Calcium: 9.4 mg/dL (ref 8.4–10.5)
Chloride: 105 mEq/L (ref 96–112)
Creatinine, Ser: 0.95 mg/dL (ref 0.40–1.50)
GFR: 87.1 mL/min (ref >60.00)
Glucose, Bld: 114 mg/dL — ABNORMAL HIGH (ref 70–99)
Potassium: 3.8 mEq/L (ref 3.5–5.1)
Sodium: 142 mEq/L (ref 135–145)

## 2016-07-09 ENCOUNTER — Other Ambulatory Visit: Payer: Self-pay | Admitting: Family

## 2016-07-20 ENCOUNTER — Encounter: Payer: Self-pay | Admitting: Family

## 2016-07-20 ENCOUNTER — Ambulatory Visit (INDEPENDENT_AMBULATORY_CARE_PROVIDER_SITE_OTHER): Payer: 59 | Admitting: Family

## 2016-07-20 VITALS — BP 135/75 | HR 64 | Temp 98.3°F | Resp 16 | Ht 69.0 in | Wt 211.6 lb

## 2016-07-20 DIAGNOSIS — R61 Generalized hyperhidrosis: Secondary | ICD-10-CM | POA: Diagnosis not present

## 2016-07-20 DIAGNOSIS — M25561 Pain in right knee: Secondary | ICD-10-CM | POA: Diagnosis not present

## 2016-07-20 LAB — TSH: TSH: 3.1 u[IU]/mL (ref 0.35–4.50)

## 2016-07-20 LAB — T4, FREE: Free T4: 0.83 ng/dL (ref 0.60–1.60)

## 2016-07-20 LAB — T3, FREE: T3, Free: 3.5 pg/mL (ref 2.3–4.2)

## 2016-07-20 MED ORDER — MELOXICAM 7.5 MG PO TABS: 7.5000 mg | ORAL_TABLET | Freq: Every day | ORAL | 0 refills | Status: DC

## 2016-07-20 NOTE — Patient Instructions (Signed)
Please complete lab work prior to leaving. Call if new/worsening symptoms.  Follow up in 4-6 weeks.

## 2016-07-20 NOTE — Progress Notes (Signed)
Subjective:    Patient ID: Richard Henderson, male    DOB: 03-02-1960, 56 y.o.   MRN: [id number]  HPI  Mr. Richard Henderson is a 56 yr old male who presents today with chief complaint of "night sweats."  Reports that has night sweats about every 3rd night. Started about 6 weeks ago.  Denies fever, fatigue, pruritis, palpitations, cough, or weight loss.  Denies risk factors for HIV or tuberculosis.He does wear cpap.   R knee pain- has been on/off x 6 weeks.  He is a soccer coach/referee.    Review of Systems See HPI  Past Medical History:  Diagnosis Date  . Allergic rhinitis   . Cancer [facility]) 2013   colon cancer  . Colonic mass   . Eczema    on hands  . GERD (gastroesophageal reflux disease)    none since cpap  . Hyperlipidemia   . Hypertension   . Serrated adenoma of rectum s/p TEM resection 27Jun2013 07/18/2011  . Sleep apnea    USES C-PAP     Social History   Social History  . Marital status: Married    Spouse name: N/A  . Number of children: 3  . Years of education: N/A   Occupational History  . Application Systems Analyst    Social History Main Topics  . Smoking status: Never Smoker  . Smokeless tobacco: Never Used  . Alcohol use 0.0 oz/week     Comment: 1  . Drug use: No  . Sexual activity: Not on file   Other Topics Concern  . 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    Past Surgical History:  Procedure Laterality Date  . COLONOSCOPY  2013  . POLYPECTOMY    . TRANSANAL ENDOSCOPIC MICROSURGERY  06/30/2011  . WISDOM TOOTH EXTRACTION  2012    Family History  Problem Relation Age of Onset  . Prostate cancer Father   . Alzheimer's disease Father   . Arthritis Other   . Colon cancer Neg Hx     No Known Allergies  Current Outpatient Prescriptions on File Prior to Visit  Medication Sig Dispense Refill  . atorvastatin (LIPITOR) 10 MG tablet TAKE 1 TABLET  BY MOUTH EVERY DAY 30 tablet 5  . cetirizine (ZYRTEC) 10 MG tablet Take 10 mg by mouth daily.    . fluticasone (FLONASE) 50 MCG/ACT nasal spray Place 2 sprays into both nostrils daily as needed for allergies or rhinitis.    [name] losartan (COZAAR) 50 MG tablet TAKE 1 TABLET BY MOUTH DAILY *MAX PER INSURANCE* 90 tablet 1  . Multiple Vitamin (MULTIVITAMIN) tablet Take 1 tablet by mouth daily as needed.     . TURMERIC PO Take 1 capsule by mouth daily.     No current facility-administered medications on file prior to visit.     BP 135/75 (BP Location: Right Arm, Cuff Size: Normal)   Pulse 64   Temp 98.3 F (36.8 C) (Oral)   Resp 16   Ht 5\' 9"  (1.753 m)   Wt 211 lb 9.6 oz (96 kg)   SpO2 99%   BMI 31.25 kg/m       Objective:   Physical Exam  Constitutional: He is oriented to person, place, and time. He appears well-developed and well-nourished. No distress.  HENT:  Head: Normocephalic and atraumatic.  Neck: No  thyromegaly present.  Cardiovascular: Normal rate and regular rhythm.   No murmur heard. Pulmonary/Chest: Effort normal and breath sounds normal. No respiratory distress. He has no wheezes. He has no rales.  Musculoskeletal: He exhibits no edema.  Right knee is without swelling.  Normal ROM of right knee, neg drawer test  Lymphadenopathy:    He has no cervical adenopathy.  Neurological: He is alert and oriented to person, place, and time.  Skin: Skin is warm and dry.  Psychiatric: He has a normal mood and affect. His behavior is normal. Thought content normal.          Assessment & Plan:  Night sweats- reviewed patient's med list.  I do not see any obvious medications that would contribute to night sweats. He is low risk for TB and denies risk factors for HIV.  Will check thyroid function testing and have pt follow up in 4-6 weeks.  (CXR, TB testing, HIV serology, blood culture, cbc).  He is advised to call sooner if new/worsening symptoms.   Right knee pain- trial of  meloxicam. If not improvement next visit plan referral to orthopedics.

## 2016-07-28 ENCOUNTER — Ambulatory Visit (INDEPENDENT_AMBULATORY_CARE_PROVIDER_SITE_OTHER): Payer: 59 | Admitting: Adult Health

## 2016-07-28 ENCOUNTER — Encounter: Payer: Self-pay | Admitting: Adult Health

## 2016-07-28 DIAGNOSIS — G4733 Obstructive sleep apnea (adult) (pediatric): Secondary | ICD-10-CM

## 2016-07-28 NOTE — Progress Notes (Signed)
@Patient  ID: Richard Henderson, male    DOB: 12/04/1960, 56 y.o.   MRN: [id number]  Chief Complaint  Patient presents with  . Follow-up    OSA     Referring provider: Debbrah Alar, NP  HPI: 56 yo male with severe OSA   TEST  PSG 03/2010 showed moderate obstructive sleep apnea - AHI 25 / hour -CPAP medium comfort gel mask 12 cm  07/28/2016 Follow up : OSA  Pt returns for 1 year follow up for OSA . He is on CPAP At bedtime  .says he is doing well with no missed nights. Never sleeps without it. Feels rested with no significant daytime sleepiness. Wears it about 6-7 hrs each night. Download requested.     No Known Allergies  Immunization History  Administered Date(s) Administered  . Influenza Whole 11/18/2008  . Influenza, Seasonal, Injecte, Preservative Fre 09/01/2012  . Influenza,inj,Quad PF,36+ Mos 09/17/2014, 09/25/2015  . Influenza-Unspecified 09/30/2013  . Tdap 03/27/2012    Past Medical History:  Diagnosis Date  . Allergic rhinitis   . Cancer [facility]) 2013   colon cancer  . Colonic mass   . Eczema    on hands  . GERD (gastroesophageal reflux disease)    none since cpap  . Hyperlipidemia   . Hypertension   . Serrated adenoma of rectum s/p TEM resection 27Jun2013 07/18/2011  . Sleep apnea    USES C-PAP    Tobacco History: History  Smoking Status  . Never Smoker  Smokeless Tobacco  . Never Used   Counseling given: Not Answered   Outpatient Encounter Prescriptions as of 07/28/2016  Medication Sig  . atorvastatin (LIPITOR) 10 MG tablet TAKE 1 TABLET BY MOUTH EVERY DAY  . cetirizine (ZYRTEC) 10 MG tablet Take 10 mg by mouth daily.  . fluticasone (FLONASE) 50 MCG/ACT nasal spray Place 2 sprays into both nostrils daily as needed for allergies or rhinitis.  [name] losartan (COZAAR) 50 MG tablet TAKE 1 TABLET BY MOUTH DAILY *MAX PER INSURANCE*  . meloxicam (MOBIC) 7.5 MG tablet Take 1 tablet (7.5 mg total) by mouth daily.  . Multiple Vitamin (MULTIVITAMIN)  tablet Take 1 tablet by mouth daily as needed.   . TURMERIC PO Take 1 capsule by mouth daily.   No facility-administered encounter medications on file as of 07/28/2016.      Review of Systems  Constitutional:   No  weight loss, night sweats,  Fevers, chills, fatigue, or  lassitude.  HEENT:   No headaches,  Difficulty swallowing,  Tooth/dental problems, or  Sore throat,                No sneezing, itching, ear ache, nasal congestion, post nasal drip,   CV:  No chest pain,  Orthopnea, PND, swelling in lower extremities, anasarca, dizziness, palpitations, syncope.   GI  No heartburn, indigestion, abdominal pain, nausea, vomiting, diarrhea, change in bowel habits, loss of appetite, bloody stools.   Resp: No shortness of breath with exertion or at rest.  No excess mucus, no productive cough,  No non-productive cough,  No coughing up of blood.  No change in color of mucus.  No wheezing.  No chest wall deformity  Skin: no rash or lesions.  GU: no dysuria, change in color of urine, no urgency or frequency.  No flank pain, no hematuria   MS:  No joint pain or swelling.  No decreased range of motion.  No back pain.    Physical Exam  BP 139/77 (BP Location:  Left Arm, Patient Position: Sitting, Cuff Size: Normal)   Pulse 62   Ht 5\' 9"  (1.753 m)   Wt 208 lb (94.3 kg)   SpO2 97%   BMI 30.72 kg/m   GEN: A/Ox3; pleasant , NAD, well nourished    HEENT:  [place]/AT,  EACs-clear, TMs-wnl, NOSE-clear, THROAT-clear, no lesions, no postnasal drip or exudate noted. Class 2 MP airway   NECK:  Supple w/ fair ROM; no JVD; normal carotid impulses w/o bruits; no thyromegaly or nodules palpated; no lymphadenopathy.    RESP  Clear  P & A; w/o, wheezes/ rales/ or rhonchi. no accessory muscle use, no dullness to percussion  CARD:  RRR, no m/r/g, no peripheral edema, pulses intact, no cyanosis or clubbing.  GI:   Soft & nt; nml bowel sounds; no organomegaly or masses detected.   Musco: Warm bil, no  deformities or joint swelling noted.   Neuro: alert, no focal deficits noted.    Skin: Warm, no lesions or rashes     Lab Results:  CBC  BNP No results found for: BNP  ProBNP No results found for: PROBNP  Imaging: No results found.   Assessment & Plan:   Obstructive sleep apnea Well controlled on CPAP  Download requested.   Plan  Patient Instructions  Continue on CPAP At bedtime   Remain active.  Do not drive if sleepy.  Follow up Dr. Elsworth Soho  In 1 year and As needed           Rexene Edison, NP 07/28/2016

## 2016-07-28 NOTE — Assessment & Plan Note (Signed)
Well controlled on CPAP  Download requested.   Plan  Patient Instructions  Continue on CPAP At bedtime   Remain active.  Do not drive if sleepy.  Follow up Dr. Elsworth Soho  In 1 year and As needed

## 2016-07-28 NOTE — Patient Instructions (Signed)
Continue on CPAP At bedtime   Remain active.  Do not drive if sleepy.  Follow up Dr. Elsworth Soho  In 1 year and As needed

## 2016-08-12 ENCOUNTER — Ambulatory Visit (INDEPENDENT_AMBULATORY_CARE_PROVIDER_SITE_OTHER): Payer: 59 | Admitting: Family

## 2016-08-12 VITALS — BP 140/77 | HR 70 | Temp 98.4°F | Resp 16 | Ht 69.0 in | Wt 210.8 lb

## 2016-08-12 DIAGNOSIS — R61 Generalized hyperhidrosis: Secondary | ICD-10-CM

## 2016-08-12 NOTE — Patient Instructions (Signed)
Please complete lab work prior to leaving.   

## 2016-08-12 NOTE — Progress Notes (Signed)
Subjective:    Patient ID: Richard Henderson, male    DOB: 03-02-1960, 56 y.o.   MRN: [id number]  HPI  Richard Henderson is a 56 year old male who presents today with chief complaint of night sweats. We last saw him with this complaint on July 18. At that time we checked thyroid function testing which was within normal limits.  Reports that night sweats have become more frequent.  Even having them during the day.    Review of Systems  Constitutional: Negative for fatigue, fever and unexpected weight change.  Gastrointestinal: Negative for abdominal pain.  Hematological: Negative for adenopathy.   Past Medical History:  Diagnosis Date  . Allergic rhinitis   . Cancer [facility]) 2013   colon cancer  . Colonic mass   . Eczema    on hands  . GERD (gastroesophageal reflux disease)    none since cpap  . Hyperlipidemia   . Hypertension   . Serrated adenoma of rectum s/p TEM resection 27Jun2013 07/18/2011  . Sleep apnea    USES C-PAP     Social History   Social History  . Marital status: Married    Spouse name: Richard Henderson  . Number of children: 3  . Years of education: Richard Henderson   Occupational History  . Application Systems Analyst    Social History Main Topics  . Smoking status: Never Smoker  . Smokeless tobacco: Never Used  . Alcohol use 0.0 oz/week     Comment: 1  . Drug use: No  . Sexual activity: Not on file   Other Topics Concern  . 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    Past Surgical History:  Procedure Laterality Date  . COLONOSCOPY  2013  . POLYPECTOMY    . TRANSANAL ENDOSCOPIC MICROSURGERY  06/30/2011  . WISDOM TOOTH EXTRACTION  2012    Family History  Problem Relation Age of Onset  . Prostate cancer Father   . Alzheimer's disease Father   . Arthritis Other   . Colon cancer Neg Hx     No Known Allergies  Current Outpatient Prescriptions on File Prior to  Visit  Medication Sig Dispense Refill  . atorvastatin (LIPITOR) 10 MG tablet TAKE 1 TABLET BY MOUTH EVERY DAY 30 tablet 5  . cetirizine (ZYRTEC) 10 MG tablet Take 10 mg by mouth daily.    . fluticasone (FLONASE) 50 MCG/ACT nasal spray Place 2 sprays into both nostrils daily as needed for allergies or rhinitis.    [name] losartan (COZAAR) 50 MG tablet TAKE 1 TABLET BY MOUTH DAILY *MAX PER INSURANCE* 90 tablet 1  . meloxicam (MOBIC) 7.5 MG tablet Take 1 tablet (7.5 mg total) by mouth daily. 14 tablet 0  . Multiple Vitamin (MULTIVITAMIN) tablet Take 1 tablet by mouth daily as needed.     . TURMERIC PO Take 1 capsule by mouth daily.     No current facility-administered medications on file prior to visit.     BP 140/77 (BP Location: Left Arm, Cuff Size: Normal)   Pulse 70   Temp 98.4 F (36.9 C) (Oral)   Resp 16   Ht 5\' 9"  (1.753 m)   Wt 210 lb 12.8 oz (95.6 kg)   SpO2 99%   BMI 31.13 kg/m       Objective:   Physical Exam  Constitutional: He is  oriented to person, place, and time. He appears well-developed and well-nourished. No distress.  HENT:  Head: Normocephalic and atraumatic.  Cardiovascular: Normal rate and regular rhythm.   No murmur heard. Pulmonary/Chest: Effort normal and breath sounds normal. No respiratory distress. He has no wheezes. He has no rales.  Musculoskeletal: He exhibits no edema.  Neurological: He is alert and oriented to person, place, and time.  Skin: Skin is warm and dry.  Psychiatric: He has a normal mood and affect. His behavior is normal. Thought content normal.          Assessment & Plan:  Night sweats-seems to be worsening. He is also having sweats during the day. His thyroid testing was normal. Will obtain CBC, complete metabolic panel, TB: Testing to screen for tuberculosis, and HIV testing. If this lab work is unrevealing would consider doing a CT of the chest abdomen and pelvis for further evaluation and to rule out underlying malignancy such as  lymphoma. Plan discussed with patient and he is in agreement.

## 2016-08-13 LAB — COMPREHENSIVE METABOLIC PANEL
ALT: 16 IU/L (ref 0–44)
AST: 14 IU/L (ref 0–40)
Albumin/Globulin Ratio: 1.8 (ref 1.2–2.2)
Albumin: 4.3 g/dL (ref 3.5–5.5)
Alkaline Phosphatase: 89 IU/L (ref 39–117)
BUN/Creatinine Ratio: 19 (ref 9–20)
BUN: 18 mg/dL (ref 6–24)
Bilirubin Total: <0.2 mg/dL (ref 0.0–1.2)
CO2: 26 mmol/L (ref 20–29)
Calcium: 9.3 mg/dL (ref 8.7–10.2)
Chloride: 104 mmol/L (ref 96–106)
Creatinine, Ser: 0.95 mg/dL (ref 0.76–1.27)
GFR calc Af Amer: 103 mL/min/{1.73_m2} (ref >59)
GFR calc non Af Amer: 89 mL/min/{1.73_m2} (ref >59)
Globulin, Total: 2.4 g/dL (ref 1.5–4.5)
Glucose: 124 mg/dL — ABNORMAL HIGH (ref 65–99)
Potassium: 4.6 mmol/L (ref 3.5–5.2)
Sodium: 144 mmol/L (ref 134–144)
Total Protein: 6.7 g/dL (ref 6.0–8.5)

## 2016-08-13 LAB — CBC WITH DIFFERENTIAL/PLATELET
Basophils Absolute: 0 10*3/uL (ref 0.0–0.2)
Basos: 0 %
EOS (ABSOLUTE): 0.1 10*3/uL (ref 0.0–0.4)
Eos: 1 %
Hematocrit: 44 % (ref 37.5–51.0)
Hemoglobin: 15.4 g/dL (ref 13.0–17.7)
Immature Grans (Abs): 0 10*3/uL (ref 0.0–0.1)
Immature Granulocytes: 0 %
Lymphocytes Absolute: 1.7 10*3/uL (ref 0.7–3.1)
Lymphs: 25 %
MCH: 30.5 pg (ref 26.6–33.0)
MCHC: 35 g/dL (ref 31.5–35.7)
MCV: 87 fL (ref 79–97)
Monocytes Absolute: 0.6 10*3/uL (ref 0.1–0.9)
Monocytes: 9 %
Neutrophils Absolute: 4.4 10*3/uL (ref 1.4–7.0)
Neutrophils: 65 %
Platelets: 220 10*3/uL (ref 150–379)
RBC: 5.05 x10E6/uL (ref 4.14–5.80)
RDW: 13.7 % (ref 12.3–15.4)
WBC: 6.8 10*3/uL (ref 3.4–10.8)

## 2016-08-13 LAB — HIV ANTIBODY (ROUTINE TESTING W REFLEX): HIV Screen 4th Generation wRfx: NONREACTIVE

## 2016-08-17 ENCOUNTER — Encounter: Payer: Self-pay | Admitting: Family

## 2016-08-17 DIAGNOSIS — R61 Generalized hyperhidrosis: Secondary | ICD-10-CM

## 2016-08-17 LAB — QUANTIFERON IN TUBE
QFT TB AG MINUS NIL VALUE: 0.01 IU/mL
QUANTIFERON MITOGEN VALUE: >10 IU/mL
QUANTIFERON TB AG VALUE: 0.06 IU/mL
QUANTIFERON TB GOLD: NEGATIVE
Quantiferon Nil Value: 0.05 IU/mL

## 2016-08-17 LAB — QUANTIFERON TB GOLD ASSAY (BLOOD)

## 2016-08-18 ENCOUNTER — Telehealth: Payer: Self-pay | Admitting: Family

## 2016-08-18 NOTE — Telephone Encounter (Signed)
Received approval from insurance for his CT scans.    Chest- [phone] Abd/pelvis [id number]  Exp on 10/02/16

## 2016-08-26 ENCOUNTER — Ambulatory Visit (HOSPITAL_BASED_OUTPATIENT_CLINIC_OR_DEPARTMENT_OTHER)
Admission: RE | Admit: 2016-08-26 | Discharge: 2016-08-26 | Disposition: A | Discharge status: Home / Self Care | Payer: 59 | Source: Ambulatory Visit | Attending: Family | Admitting: Family

## 2016-08-26 ENCOUNTER — Encounter (HOSPITAL_BASED_OUTPATIENT_CLINIC_OR_DEPARTMENT_OTHER): Payer: Self-pay

## 2016-08-26 DIAGNOSIS — K7689 Other specified diseases of liver: Secondary | ICD-10-CM | POA: Diagnosis not present

## 2016-08-26 DIAGNOSIS — K439 Ventral hernia without obstruction or gangrene: Secondary | ICD-10-CM | POA: Diagnosis not present

## 2016-08-26 DIAGNOSIS — J9811 Atelectasis: Secondary | ICD-10-CM | POA: Insufficient documentation

## 2016-08-26 DIAGNOSIS — K449 Diaphragmatic hernia without obstruction or gangrene: Secondary | ICD-10-CM | POA: Insufficient documentation

## 2016-08-26 DIAGNOSIS — R61 Generalized hyperhidrosis: Secondary | ICD-10-CM | POA: Insufficient documentation

## 2016-08-26 DIAGNOSIS — I708 Atherosclerosis of other arteries: Secondary | ICD-10-CM | POA: Diagnosis not present

## 2016-08-26 DIAGNOSIS — I700 Atherosclerosis of aorta: Secondary | ICD-10-CM | POA: Insufficient documentation

## 2016-08-26 MED ORDER — IOPAMIDOL (ISOVUE-300) INJECTION 61%
100.0000 mL | Freq: Once | INTRAVENOUS | Status: AC | PRN
  Administered 2016-08-26: 100 mL via INTRAVENOUS

## 2016-08-28 ENCOUNTER — Encounter: Payer: Self-pay | Admitting: Family

## 2016-09-21 ENCOUNTER — Other Ambulatory Visit: Payer: Self-pay | Admitting: Dermatology

## 2016-09-28 ENCOUNTER — Ambulatory Visit (INDEPENDENT_AMBULATORY_CARE_PROVIDER_SITE_OTHER): Payer: 59 | Admitting: Family

## 2016-09-28 ENCOUNTER — Encounter: Payer: Self-pay | Admitting: Family

## 2016-09-28 VITALS — BP 120/71 | HR 58 | Temp 98.6°F | Resp 16 | Ht 69.0 in | Wt 203.6 lb

## 2016-09-28 DIAGNOSIS — Z0000 Encounter for general adult medical examination without abnormal findings: Secondary | ICD-10-CM

## 2016-09-28 DIAGNOSIS — Z23 Encounter for immunization: Secondary | ICD-10-CM | POA: Diagnosis not present

## 2016-09-28 DIAGNOSIS — Z125 Encounter for screening for malignant neoplasm of prostate: Secondary | ICD-10-CM | POA: Diagnosis not present

## 2016-09-28 DIAGNOSIS — H9190 Unspecified hearing loss, unspecified ear: Secondary | ICD-10-CM

## 2016-09-28 LAB — URINALYSIS, ROUTINE W REFLEX MICROSCOPIC
Bilirubin Urine: NEGATIVE
Ketones, ur: NEGATIVE
Leukocytes, UA: NEGATIVE
Nitrite: NEGATIVE
RBC / HPF: NONE SEEN (ref 0–?)
Specific Gravity, Urine: 1.025 (ref 1.000–1.030)
Total Protein, Urine: NEGATIVE
Urine Glucose: NEGATIVE
Urobilinogen, UA: 0.2 (ref 0.0–1.0)
pH: 6 (ref 5.0–8.0)

## 2016-09-28 LAB — PSA: PSA: 1.13 ng/mL (ref 0.10–4.00)

## 2016-09-28 NOTE — Progress Notes (Signed)
Subjective:    Patient ID: Richard Henderson, male    DOB: 1960-06-03, 56 y.o.   MRN: [id number]  HPI  [name] is a 56 yr old male who presents today for cpx.  Immunizations: tetanus 2014 Diet: reports diet is improved.  Wt Readings from Last 3 Encounters:  09/28/16 203 lb 9.6 oz (92.4 kg)  08/12/16 210 lb 12.8 oz (95.6 kg)  07/28/16 208 lb (94.3 kg)  Exercise: walks every day on the treadmill, light weight training, soccer refereeing Colonoscopy: 7/17 Vision: up to date, wears reading glasses. Dental:  Up to date   Continues to have some right knee pain.  Has been doing a lot of painting at his house.  Up and down the ladder really irritates it.  Would like to try exercise bike.     Review of Systems  Constitutional: Negative for unexpected weight change.  HENT: Positive for rhinorrhea.        Hearing is "not great"    Eyes: Negative for visual disturbance.  Respiratory: Negative for cough.   Cardiovascular: Negative for leg swelling.  Gastrointestinal: Negative for constipation and diarrhea.  Genitourinary: Negative for difficulty urinating, dysuria and frequency.  Musculoskeletal:       Occasional left upper back pain with sneezing only  Skin: Negative for rash.  Neurological: Negative for headaches.  Hematological: Negative for adenopathy.  Psychiatric/Behavioral:       Denies depression/anxiety   Past Medical History:  Diagnosis Date  . Allergic rhinitis   . Cancer [facility]) 2013   colon cancer  . Colonic mass   . Eczema    on hands  . GERD (gastroesophageal reflux disease)    none since cpap  . Hyperlipidemia   . Hypertension   . Serrated adenoma of rectum s/p TEM resection 27Jun2013 07/18/2011  . Sleep apnea    USES C-PAP     Social History   Social History  . Marital status: Married    Spouse name: N/A  . Number of children: 3  . Years of education: N/A   Occupational History  . Application Systems Analyst    Social History Main Topics  .  Smoking status: Never Smoker  . Smokeless tobacco: Never Used  . Alcohol use 0.0 oz/week     Comment: 1  . Drug use: No  . Sexual activity: Not on file   Other Topics Concern  . 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    Past Surgical History:  Procedure Laterality Date  . COLONOSCOPY  2013  . POLYPECTOMY    . TRANSANAL ENDOSCOPIC MICROSURGERY  06/30/2011  . WISDOM TOOTH EXTRACTION  2012    Family History  Problem Relation Age of Onset  . Prostate cancer Father   . Alzheimer's disease Father   . Arthritis Other   . Colon cancer Neg Hx     No Known Allergies  Current Outpatient Prescriptions on File Prior to Visit  Medication Sig Dispense Refill  . atorvastatin (LIPITOR) 10 MG tablet TAKE 1 TABLET BY MOUTH EVERY DAY 30 tablet 5  . cetirizine (ZYRTEC) 10 MG tablet Take 10 mg by mouth daily.    . fluticasone (FLONASE) 50 MCG/ACT nasal spray Place 2 sprays into both nostrils daily as needed for allergies or rhinitis.    [name] losartan (COZAAR) 50 MG tablet  TAKE 1 TABLET BY MOUTH DAILY *MAX PER INSURANCE* 90 tablet 1  . meloxicam (MOBIC) 7.5 MG tablet Take 1 tablet (7.5 mg total) by mouth daily. 14 tablet 0  . Multiple Vitamin (MULTIVITAMIN) tablet Take 1 tablet by mouth daily as needed.      No current facility-administered medications on file prior to visit.     BP 120/71 (BP Location: Right Arm, Cuff Size: Normal)   Pulse (!) 58   Temp 98.6 F (37 C) (Oral)   Resp 16   Ht 5\' 9"  (1.753 m)   Wt 203 lb 9.6 oz (92.4 kg)   SpO2 99%   BMI 30.07 kg/m       Objective:   Physical Exam  Physical Exam  Constitutional: He is oriented to person, place, and time. He appears well-developed and well-nourished. No distress.  HENT:  Head: Normocephalic and atraumatic.  Right Ear: Tympanic membrane and ear canal normal. Small exostoses in ear canal bilaterally Left Ear:  Tympanic membrane and ear canal normal.  Mouth/Throat: Oropharynx is clear and moist.  Eyes: Pupils are equal, round, and reactive to light. No scleral icterus.  Neck: Normal range of motion. No thyromegaly present.  Cardiovascular: Normal rate and regular rhythm.   No murmur heard. Pulmonary/Chest: Effort normal and breath sounds normal. No respiratory distress. He has no wheezes. He has no rales. He exhibits no tenderness.  Abdominal: Soft. Bowel sounds are normal. He exhibits no distension and no mass. There is no tenderness. There is no rebound and no guarding. small reducible umbilical hernia Musculoskeletal: He exhibits no edema.  Lymphadenopathy:    He has no cervical adenopathy.  Neurological: He is alert and oriented to person, place, and time. He has normal patellar reflexes. He exhibits normal muscle tone. Coordination normal.  Skin: Skin is warm and dry.  Psychiatric: He has a normal mood and affect. His behavior is normal. Judgment and thought content normal.           Assessment & Plan:         Assessment & Plan:  Preventative Care- EKG tracing is personally reviewed.  EKG notes NSR.  No acute changes. Discussed healthy diet, exercise weight loss. Flu shot today. We discussed shingrix which is currently on back order. He would consider receiving in the future.  Hearing problem- will refer to audiology for further evaluation.   Right knee pain- we discussed referral to sports med- he wishes to hold off for now.

## 2016-09-28 NOTE — Patient Instructions (Signed)
Please complete lab work prior to leaving. Continue to work on healthy diet, regular exercise and weight loss.  

## 2016-10-07 ENCOUNTER — Other Ambulatory Visit: Payer: Self-pay | Admitting: Family

## 2016-12-21 ENCOUNTER — Other Ambulatory Visit: Payer: Self-pay | Admitting: Family

## 2016-12-23 ENCOUNTER — Ambulatory Visit: Payer: 59 | Admitting: Family

## 2017-01-06 ENCOUNTER — Telehealth: Payer: Self-pay | Admitting: Pulmonary Disease

## 2017-01-06 DIAGNOSIS — G4733 Obstructive sleep apnea (adult) (pediatric): Secondary | ICD-10-CM

## 2017-01-06 NOTE — Telephone Encounter (Signed)
ATC pt, no answer. Left message for pt to call back.  The last setting in the chart for his CPAp was set at 14cm.

## 2017-01-06 NOTE — Telephone Encounter (Signed)
Left message for patient to call back  

## 2017-01-06 NOTE — Telephone Encounter (Signed)
Pt is returning call. Cb is [phone].

## 2017-01-06 NOTE — Telephone Encounter (Signed)
OK to increase to 14 cm

## 2017-01-06 NOTE — Telephone Encounter (Signed)
Spoke with pt, he states they sent his new CPAP machine with the pressure set at 12cm. According to his chart he is supposed to be on 14 cm. Please clarify RA.so I can change pressure settings.   Richard Henderson

## 2017-01-09 NOTE — Telephone Encounter (Signed)
Order placed to [place] for pressure change.  Pt aware.  Nothing further needed.

## 2017-03-30 ENCOUNTER — Encounter: Payer: Self-pay | Admitting: Family

## 2017-03-30 ENCOUNTER — Ambulatory Visit (INDEPENDENT_AMBULATORY_CARE_PROVIDER_SITE_OTHER): Payer: 59 | Admitting: Family

## 2017-03-30 VITALS — BP 136/75 | HR 64 | Temp 98.7°F | Resp 16 | Ht 69.0 in | Wt 210.8 lb

## 2017-03-30 DIAGNOSIS — I10 Essential (primary) hypertension: Secondary | ICD-10-CM | POA: Diagnosis not present

## 2017-03-30 DIAGNOSIS — B070 Plantar wart: Secondary | ICD-10-CM | POA: Diagnosis not present

## 2017-03-30 DIAGNOSIS — E785 Hyperlipidemia, unspecified: Secondary | ICD-10-CM | POA: Diagnosis not present

## 2017-03-30 DIAGNOSIS — R739 Hyperglycemia, unspecified: Secondary | ICD-10-CM

## 2017-03-30 LAB — BASIC METABOLIC PANEL
BUN: 17 mg/dL (ref 6–23)
CO2: 31 mEq/L (ref 19–32)
Calcium: 9.3 mg/dL (ref 8.4–10.5)
Chloride: 104 mEq/L (ref 96–112)
Creatinine, Ser: 0.98 mg/dL (ref 0.40–1.50)
GFR: 83.8 mL/min (ref >60.00)
Glucose, Bld: 129 mg/dL — ABNORMAL HIGH (ref 70–99)
Potassium: 4.8 mEq/L (ref 3.5–5.1)
Sodium: 141 mEq/L (ref 135–145)

## 2017-03-30 LAB — LIPID PANEL
Cholesterol: 173 mg/dL (ref 0–200)
HDL: 44.7 mg/dL (ref >39.00)
LDL Cholesterol: 106 mg/dL — ABNORMAL HIGH (ref 0–99)
NonHDL: 128.22
Total CHOL/HDL Ratio: 4
Triglycerides: 110 mg/dL (ref 0.0–149.0)
VLDL: 22 mg/dL (ref 0.0–40.0)

## 2017-03-30 MED ORDER — ATORVASTATIN CALCIUM 10 MG PO TABS: 10.0000 mg | ORAL_TABLET | Freq: Every day | ORAL | 1 refills | Status: DC

## 2017-03-30 MED ORDER — LOSARTAN POTASSIUM 50 MG PO TABS: ORAL_TABLET | ORAL | 1 refills | Status: DC

## 2017-03-30 NOTE — Patient Instructions (Signed)
Please complete lab work prior to leaving.   

## 2017-03-30 NOTE — Progress Notes (Signed)
   Subjective:    Patient ID: Richard Henderson, male    DOB: Jun 08, 1960, 57 y.o.   MRN: [id number]  HPI  HTN- Maintained on losartan.  BP Readings from Last 3 Encounters:  03/30/17 136/75  09/28/16 120/71  08/12/16 140/77   Hyperlipidemia- denies myalgia. Continues liptor.  Lab Results  Component Value Date   CHOL 145 09/17/2014   HDL 43.00 09/17/2014   LDLCALC 88 09/17/2014   TRIG 70.0 09/17/2014   CHOLHDL 3 09/17/2014     Review of Systems     Objective:   Physical Exam  Constitutional: He is oriented to person, place, and time. He appears well-developed and well-nourished. No distress.  HENT:  Head: Normocephalic and atraumatic.  Cardiovascular: Normal rate and regular rhythm.  No murmur heard. Pulmonary/Chest: Effort normal and breath sounds normal. No respiratory distress. He has no wheezes. He has no rales.  Musculoskeletal: He exhibits no edema.  Neurological: He is alert and oriented to person, place, and time.  Skin: Skin is warm and dry.  + thickened skin noted overlying wart on left great toe.  Psychiatric: He has a normal mood and affect. His behavior is normal. Thought content normal.          Assessment & Plan:  HTN- bp stable on losartan, continue same. Obtain follow up bmet.  Hyperlipidemia- tolerating statin, obtain follow up lipid panel.   Plantar wart- wart was debrided with scalpel. Patient will continue to apply salicylic acid and duct tape.

## 2017-03-31 ENCOUNTER — Ambulatory Visit: Payer: 59 | Admitting: Family

## 2017-03-31 ENCOUNTER — Other Ambulatory Visit: Payer: Self-pay | Admitting: Emergency Medicine

## 2017-03-31 DIAGNOSIS — R739 Hyperglycemia, unspecified: Secondary | ICD-10-CM

## 2017-04-05 ENCOUNTER — Encounter: Payer: Self-pay | Admitting: Family

## 2017-04-05 ENCOUNTER — Other Ambulatory Visit (INDEPENDENT_AMBULATORY_CARE_PROVIDER_SITE_OTHER): Payer: 59

## 2017-04-05 DIAGNOSIS — R739 Hyperglycemia, unspecified: Secondary | ICD-10-CM | POA: Diagnosis not present

## 2017-04-05 LAB — HEMOGLOBIN A1C: Hgb A1c MFr Bld: 5.7 % (ref 4.6–6.5)

## 2017-06-09 ENCOUNTER — Encounter: Payer: Self-pay | Admitting: Medical

## 2017-06-09 ENCOUNTER — Ambulatory Visit (INDEPENDENT_AMBULATORY_CARE_PROVIDER_SITE_OTHER): Payer: 59 | Admitting: Medical

## 2017-06-09 VITALS — BP 136/76 | HR 64 | Temp 98.1°F | Resp 16 | Ht 69.0 in | Wt 207.0 lb

## 2017-06-09 DIAGNOSIS — W57XXXA Bitten or stung by nonvenomous insect and other nonvenomous arthropods, initial encounter: Secondary | ICD-10-CM | POA: Diagnosis not present

## 2017-06-09 DIAGNOSIS — M791 Myalgia, unspecified site: Secondary | ICD-10-CM | POA: Diagnosis not present

## 2017-06-09 DIAGNOSIS — M25519 Pain in unspecified shoulder: Secondary | ICD-10-CM

## 2017-06-09 DIAGNOSIS — S70262A Insect bite (nonvenomous), left hip, initial encounter: Secondary | ICD-10-CM

## 2017-06-09 MED ORDER — DOXYCYCLINE HYCLATE 100 MG PO TABS: 100.0000 mg | ORAL_TABLET | Freq: Two times a day (BID) | ORAL | 0 refills | Status: DC

## 2017-06-09 NOTE — Patient Instructions (Addendum)
For recent tick bite with some achiness to joints, I do think it is best to go ahead and start doxycycline antibiotic(rx advisement given).  Also you can get a tick bite studies done today.  For joint achiness can take ibuprofen or Aleve.  Etiology of achiness could be from the project you are doing at your home versus symptom of tickborne disease.  We will follow your labs and let you know those results.  If your joint pain worsens or new signs/symptoms please let us know.  Follow-up in 10 to 14 days or as needed.

## 2017-06-09 NOTE — Progress Notes (Signed)
Subjective:    Patient ID: Richard Henderson, male    DOB: 20-Jun-1960, 57 y.o.   MRN: [id number]  HPI  Pt found tick on his left hip in shower today. Pt not sure how long tick has been on him.   Pt has been doing a lot of yard work. He has been doing Dentist a patio. Then dropping off dirt in woods behind house.  He has had joint pains and muscle aches. He not sure if from work related or maybe from tick bite. No fever, no chills or sweats.   Review of Systems  Constitutional: Negative for chills, fatigue and fever.  Respiratory: Negative for cough, chest tightness, shortness of breath and wheezing.   Cardiovascular: Negative for chest pain and palpitations.  Gastrointestinal: Negative for abdominal pain, blood in stool, constipation, nausea and vomiting.  Musculoskeletal: Positive for arthralgias and myalgias. Negative for back pain, neck pain and neck stiffness.       Achiness shoulders, elbow and knees.  Some bilateral hand aches as well.  Skin: Negative for rash.  Hematological: Negative for adenopathy. Does not bruise/bleed easily.  Psychiatric/Behavioral: Negative for behavioral problems and confusion. The patient is not nervous/anxious.     Past Medical History:  Diagnosis Date  . Allergic rhinitis   . Cancer [facility]) 2013   colon cancer  . Colonic mass   . Eczema    on hands  . GERD (gastroesophageal reflux disease)    none since cpap  . Hyperlipidemia   . Hypertension   . Serrated adenoma of rectum s/p TEM resection 27Jun2013 07/18/2011  . Sleep apnea    USES C-PAP     Social History   Socioeconomic History  . Marital status: Married    Spouse name: Not on file  . Number of children: 3  . Years of education: Not on file  . Highest education level: Not on file  Occupational History  . Occupation: Dispensing optician  Social Needs  . Financial resource strain: Not on file  . Food insecurity:    Worry: Not on file    Inability: Not on file   . Transportation needs:    Medical: Not on file    Non-medical: Not on file  Tobacco Use  . Smoking status: Never Smoker  . Smokeless tobacco: Never Used  Substance and Sexual Activity  . Alcohol use: Yes    Alcohol/week: 0.0 oz    Comment: 1  . Drug use: No  . Sexual activity: Not on file  Lifestyle  . Physical activity:    Days per week: Not on file    Minutes per session: Not on file  . Stress: Not on file  Relationships  . Social connections:    Talks on phone: Not on file    Gets together: Not on file    Attends religious service: Not on file    Active member of club or organization: Not on file    Attends meetings of clubs or organizations: Not on file    Relationship status: Not on file  . Intimate partner violence:    Fear of current or ex partner: Not on file    Emotionally abused: Not on file    Physically abused: Not on file    Forced sexual activity: Not on file  Other Topics Concern  . 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    Past Surgical History:  Procedure Laterality Date  . COLONOSCOPY  2013  . POLYPECTOMY    . TRANSANAL ENDOSCOPIC MICROSURGERY  06/30/2011  . WISDOM TOOTH EXTRACTION  2012    Family History  Problem Relation Age of Onset  . Prostate cancer Father   . Alzheimer's disease Father   . Arthritis Other   . Colon cancer Neg Hx     No Known Allergies  Current Outpatient Medications on File Prior to Visit  Medication Sig Dispense Refill  . atorvastatin (LIPITOR) 10 MG tablet Take 1 tablet (10 mg total) by mouth daily. 90 tablet 1  . cetirizine (ZYRTEC) 10 MG tablet Take 10 mg by mouth daily.    [name] losartan (COZAAR) 50 MG tablet TAKE 1 TABLET BY MOUTH DAILY *MAX PER INSURANCE* 90 tablet 1  . Multiple Vitamin (MULTIVITAMIN) tablet Take 1 tablet by mouth daily as needed.     . fluticasone (FLONASE) 50 MCG/ACT nasal spray Place 2  sprays into both nostrils daily as needed for allergies or rhinitis.     No current facility-administered medications on file prior to visit.     BP 136/76   Pulse 64   Temp 98.1 F (36.7 C) (Oral)   Resp 16   Ht 5\' 9"  (1.753 m)   Wt 207 lb (93.9 kg)   SpO2 100%   BMI 30.57 kg/m      Objective:   Physical Exam  General Mental Status- Alert. General Appearance- Not in acute distress.   Skin General: Color- Normal Color. Moisture- Normal Moisture.  Neck Carotid Arteries- Normal color. Moisture- Normal Moisture. No carotid bruits. No JVD.  Chest and Lung Exam Auscultation: Breath Sounds:-Normal.  Cardiovascular Auscultation:Rythm- Regular. Murmurs & Other Heart Sounds:Auscultation of the heart reveals- No Murmurs.  Abdomen Inspection:-Inspeection Normal. Palpation/Percussion:Note:No mass. Palpation and Percussion of the abdomen reveal- Non Tender, Non Distended + BS, no rebound or guarding.  Neurologic Cranial Nerve exam:- CN III-XII intact(No nystagmus), symmetric smile. Strength:- 5/5 equal and symmetric strength both upper and lower extremities.  Skin- left hip. Small raised red area about 4 mm wide. On insepction. No portion of tick seen.    Assessment & Plan:  For recent tick bite with some achiness to joints, I do think it is best to go ahead and start doxycycline antibiotic.  Also you can get a tick bite studies done today.  For joint achiness can take ibuprofen or Aleve.  Etiology of achiness could be from the project you are doing at your home versus symptom of tickborne disease.  We will follow your labs and let you know those results.  If your joint pain worsens or new signs/symptoms please let us know.  Follow-up in 10 to 14 days or as needed.  Mackie Pai, PA-C

## 2017-06-12 LAB — B. BURGDORFI ANTIBODIES: B burgdorferi Ab IgG+IgM: <0.9 index

## 2017-06-12 LAB — ROCKY MTN SPOTTED FVR ABS PNL(IGG+IGM)
RMSF IgG: NOT DETECTED
RMSF IgM: NOT DETECTED

## 2017-08-25 ENCOUNTER — Encounter: Payer: Self-pay | Admitting: Family

## 2017-09-06 ENCOUNTER — Telehealth: Payer: Self-pay | Admitting: *Deleted

## 2017-09-06 NOTE — Telephone Encounter (Signed)
Received Lab Report results from [facility]; forwarded to provider/SLS 09/04

## 2017-09-29 ENCOUNTER — Encounter: Payer: Self-pay | Admitting: Family

## 2017-09-29 ENCOUNTER — Ambulatory Visit (INDEPENDENT_AMBULATORY_CARE_PROVIDER_SITE_OTHER): Payer: 59 | Admitting: Family

## 2017-09-29 VITALS — BP 132/74 | HR 62 | Temp 98.0°F | Resp 16 | Ht 69.0 in | Wt 214.0 lb

## 2017-09-29 DIAGNOSIS — Z0000 Encounter for general adult medical examination without abnormal findings: Secondary | ICD-10-CM | POA: Diagnosis not present

## 2017-09-29 DIAGNOSIS — Z23 Encounter for immunization: Secondary | ICD-10-CM

## 2017-09-29 DIAGNOSIS — Z125 Encounter for screening for malignant neoplasm of prostate: Secondary | ICD-10-CM | POA: Diagnosis not present

## 2017-09-29 LAB — URINALYSIS, ROUTINE W REFLEX MICROSCOPIC
Bilirubin Urine: NEGATIVE
Ketones, ur: NEGATIVE
Leukocytes, UA: NEGATIVE
Nitrite: NEGATIVE
Specific Gravity, Urine: 1.015 (ref 1.000–1.030)
Total Protein, Urine: NEGATIVE
Urine Glucose: NEGATIVE
Urobilinogen, UA: 0.2 (ref 0.0–1.0)
WBC, UA: NONE SEEN (ref 0–?)
pH: 6.5 (ref 5.0–8.0)

## 2017-09-29 LAB — CBC WITH DIFFERENTIAL/PLATELET
Basophils Absolute: 0 10*3/uL (ref 0.0–0.1)
Basophils Relative: 0.5 % (ref 0.0–3.0)
Eosinophils Absolute: 0.1 10*3/uL (ref 0.0–0.7)
Eosinophils Relative: 1.4 % (ref 0.0–5.0)
HCT: 47.2 % (ref 39.0–52.0)
Hemoglobin: 16.2 g/dL (ref 13.0–17.0)
Lymphocytes Relative: 23.9 % (ref 12.0–46.0)
Lymphs Abs: 1.5 10*3/uL (ref 0.7–4.0)
MCHC: 34.3 g/dL (ref 30.0–36.0)
MCV: 88.9 fl (ref 78.0–100.0)
Monocytes Absolute: 0.5 10*3/uL (ref 0.1–1.0)
Monocytes Relative: 8.1 % (ref 3.0–12.0)
Neutro Abs: 4.3 10*3/uL (ref 1.4–7.7)
Neutrophils Relative %: 66.1 % (ref 43.0–77.0)
Platelets: 190 10*3/uL (ref 150.0–400.0)
RBC: 5.31 Mil/uL (ref 4.22–5.81)
RDW: 13.7 % (ref 11.5–15.5)
WBC: 6.5 10*3/uL (ref 4.0–10.5)

## 2017-09-29 LAB — PSA: PSA: 0.97 ng/mL (ref 0.10–4.00)

## 2017-09-29 LAB — HEPATIC FUNCTION PANEL
ALT: 15 U/L (ref 0–53)
AST: 11 U/L (ref 0–37)
Albumin: 4.4 g/dL (ref 3.5–5.2)
Alkaline Phosphatase: 77 U/L (ref 39–117)
Bilirubin, Direct: 0.1 mg/dL (ref 0.0–0.3)
Total Bilirubin: 0.8 mg/dL (ref 0.2–1.2)
Total Protein: 6.7 g/dL (ref 6.0–8.3)

## 2017-09-29 LAB — BASIC METABOLIC PANEL
BUN: 19 mg/dL (ref 6–23)
CO2: 32 mEq/L (ref 19–32)
Calcium: 9.5 mg/dL (ref 8.4–10.5)
Chloride: 102 mEq/L (ref 96–112)
Creatinine, Ser: 0.97 mg/dL (ref 0.40–1.50)
GFR: 84.65 mL/min (ref >60.00)
Glucose, Bld: 104 mg/dL — ABNORMAL HIGH (ref 70–99)
Potassium: 4.5 mEq/L (ref 3.5–5.1)
Sodium: 140 mEq/L (ref 135–145)

## 2017-09-29 LAB — TSH: TSH: 2.03 u[IU]/mL (ref 0.35–4.50)

## 2017-09-29 LAB — LIPID PANEL
Cholesterol: 181 mg/dL (ref 0–200)
HDL: 42.4 mg/dL (ref >39.00)
LDL Cholesterol: 108 mg/dL — ABNORMAL HIGH (ref 0–99)
NonHDL: 138.7
Total CHOL/HDL Ratio: 4
Triglycerides: 153 mg/dL — ABNORMAL HIGH (ref 0.0–149.0)
VLDL: 30.6 mg/dL (ref 0.0–40.0)

## 2017-09-29 NOTE — Patient Instructions (Signed)
Please complete lab work prior to leaving.  Please continue to work on healthy diet, exercise and weight loss.  

## 2017-09-29 NOTE — Progress Notes (Signed)
Subjective:    Patient ID: Richard Henderson, male    DOB: November 27, 1960, 57 y.o.   MRN: [id number]  HPI  Patient presents today for complete physical.  Immunizations: tdap 2019, candidate for shingrix.   Diet: needs improvement Wt Readings from Last 3 Encounters:  09/29/17 214 lb (97.1 kg)  06/09/17 207 lb (93.9 kg)  03/30/17 210 lb 12.8 oz (95.6 kg)  Exercise: not exercising Colonoscopy: 07/09/15 Vision:  Last year Dental: up to date Lab Results  Component Value Date   PSA 1.13 09/28/2016   PSA 0.91 09/17/2014   PSA 0.94 03/27/2012   He is excited about a new grandson who is living with him and is 58 weeks old.     Review of Systems  Constitutional: Negative for unexpected weight change.  HENT: Positive for rhinorrhea. Negative for hearing loss.   Eyes: Negative for visual disturbance.  Respiratory: Negative for cough.   Cardiovascular: Negative for leg swelling.  Gastrointestinal: Negative for blood in stool, constipation and diarrhea.  Genitourinary: Negative for dysuria and frequency.  Musculoskeletal: Negative for arthralgias and myalgias.  Neurological: Negative for headaches.  Hematological: Negative for adenopathy.  Psychiatric/Behavioral:       Denies depression/anxiety   Past Medical History:  Diagnosis Date  . Allergic rhinitis   . Cancer [facility]) 2013   colon cancer  . Colonic mass   . Eczema    on hands  . GERD (gastroesophageal reflux disease)    none since cpap  . Hyperlipidemia   . Hypertension   . Serrated adenoma of rectum s/p TEM resection 27Jun2013 07/18/2011  . Sleep apnea    USES C-PAP     Social History   Socioeconomic History  . Marital status: Married    Spouse name: Not on file  . Number of children: 3  . Years of education: Not on file  . Highest education level: Not on file  Occupational History  . Occupation: Dispensing optician  Social Needs  . Financial resource strain: Not on file  . Food insecurity:    Worry: Not  on file    Inability: Not on file  . Transportation needs:    Medical: Not on file    Non-medical: Not on file  Tobacco Use  . Smoking status: Never Smoker  . Smokeless tobacco: Never Used  Substance and Sexual Activity  . Alcohol use: Yes    Alcohol/week: 0.0 standard drinks    Comment: 1  . Drug use: No  . Sexual activity: Not on file  Lifestyle  . Physical activity:    Days per week: Not on file    Minutes per session: Not on file  . Stress: Not on file  Relationships  . Social connections:    Talks on phone: Not on file    Gets together: Not on file    Attends religious service: Not on file    Active member of club or organization: Not on file    Attends meetings of clubs or organizations: Not on file    Relationship status: Not on file  . Intimate partner violence:    Fear of current or ex partner: Not on file    Emotionally abused: Not on file    Physically abused: Not on file    Forced sexual activity: Not on file  Other Topics Concern  . 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    Past Surgical History:  Procedure Laterality Date  . COLONOSCOPY  2013  . POLYPECTOMY    . TRANSANAL ENDOSCOPIC MICROSURGERY  06/30/2011  . WISDOM TOOTH EXTRACTION  2012    Family History  Problem Relation Age of Onset  . Prostate cancer Father   . Alzheimer's disease Father   . Arthritis Other   . Colon cancer Neg Hx     No Known Allergies  Current Outpatient Medications on File Prior to Visit  Medication Sig Dispense Refill  . atorvastatin (LIPITOR) 10 MG tablet Take 1 tablet (10 mg total) by mouth daily. 90 tablet 1  . cetirizine (ZYRTEC) 10 MG tablet Take 10 mg by mouth daily.    . fluticasone (FLONASE) 50 MCG/ACT nasal spray Place 2 sprays into both nostrils daily as needed for allergies or rhinitis.    [name] losartan (COZAAR) 50 MG tablet TAKE 1 TABLET  BY MOUTH DAILY *MAX PER INSURANCE* 90 tablet 1  . Multiple Vitamin (MULTIVITAMIN) tablet Take 1 tablet by mouth daily as needed.      No current facility-administered medications on file prior to visit.     BP 132/74 (BP Location: Right Arm, Patient Position: Sitting, Cuff Size: Small)   Pulse 62   Temp 98 F (36.7 C) (Oral)   Resp 16   Ht 5\' 9"  (1.753 m)   Wt 214 lb (97.1 kg)   SpO2 98%   BMI 31.60 kg/m       Objective:   Physical Exam  Physical Exam  Constitutional: He is oriented to person, place, and time. He appears well-developed and well-nourished. No distress.  HENT:  Head: Normocephalic and atraumatic.  Right Ear: Tympanic membrane normal.  small exostosis in canal Left Ear: Tympanic membrane normal. Small exostosis Mouth/Throat: Oropharynx is clear and moist.  Eyes: Pupils are equal, round, and reactive to light. No scleral icterus.  Neck: Normal range of motion. No thyromegaly present.  Cardiovascular: Normal rate and regular rhythm.   No murmur heard. Pulmonary/Chest: Effort normal and breath sounds normal. No respiratory distress. He has no wheezes. He has no rales. He exhibits no tenderness.  Abdominal: Soft. Bowel sounds are normal. He exhibits no distension and no mass. There is no tenderness. There is no rebound and no guarding.  Musculoskeletal: He exhibits no edema.  Lymphadenopathy:    He has no cervical adenopathy.  Neurological: He is alert and oriented to person, place, and time. He has normal patellar reflexes. He exhibits normal muscle tone. Coordination normal.  Skin: Skin is warm and dry.  Psychiatric: He has a normal mood and affect. His behavior is normal. Judgment and thought content normal.           Assessment & Plan:   Preventative care- discussed healthy diet, exercise and weight loss. Flu shot today and Shingrix #1.  Colo up to date.  Obtain follow up labs.       Assessment & Plan:  EKG tracing is personally reviewed.  EKG notes  NSR.  No acute changes.

## 2017-10-03 ENCOUNTER — Other Ambulatory Visit: Payer: Self-pay | Admitting: Family

## 2017-10-13 IMAGING — CT CT CHEST W/ CM
2 of 5 series · 12 of 36 positions shown, 15 images · IV contrast (APPLIED)
Comparison: None.

CLINICAL DATA: Two-month history of night sweats. History of colon
carcinoma.

EXAM:
CT CHEST, ABDOMEN, AND PELVIS WITH CONTRAST
TECHNIQUE: Multidetector CT imaging of the chest, abdomen and pelvis was
performed following the standard protocol during bolus
administration of intravenous contrast. Oral contrast was
administered for the CT abdomen and pelvis.
CONTRAST:  100mL DET59B-ACC IOPAMIDOL (DET59B-ACC) INJECTION 61%

[Series 2: cap with 2 · axial · 0.82mm/px · z∈[-538,+16]mm · 9 of 137 slices shown, 12 images]
[im 13/137  mediastinal]
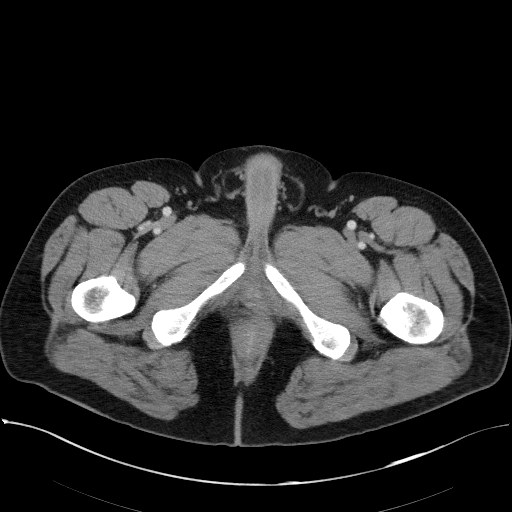
[im 13/137  lung]
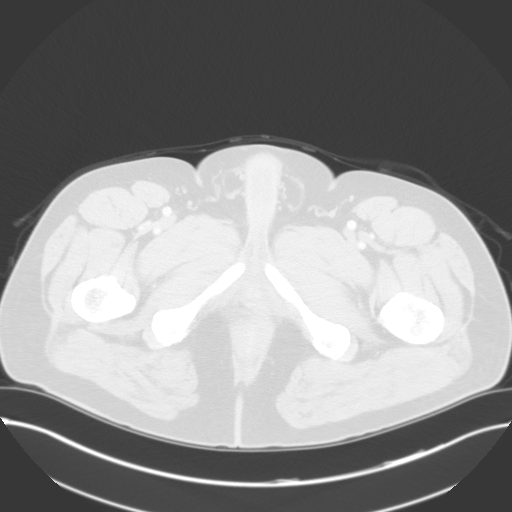
[im 25/137  lung]
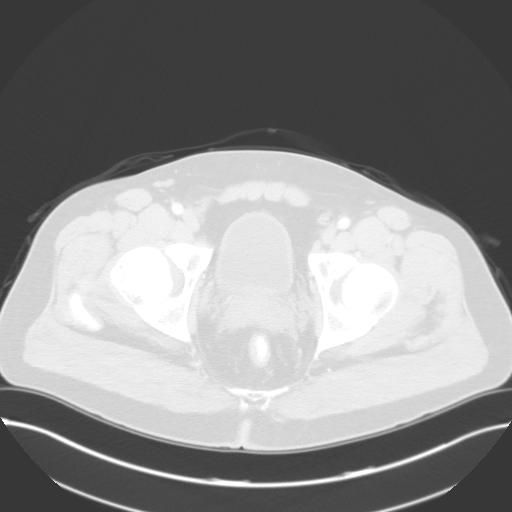
[im 38/137  lung]
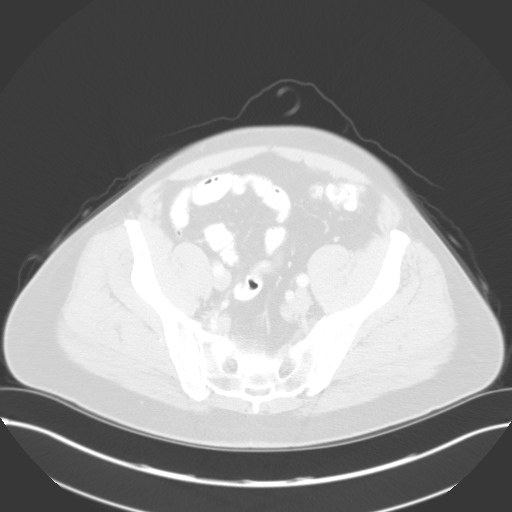
[im 50/137  lung]
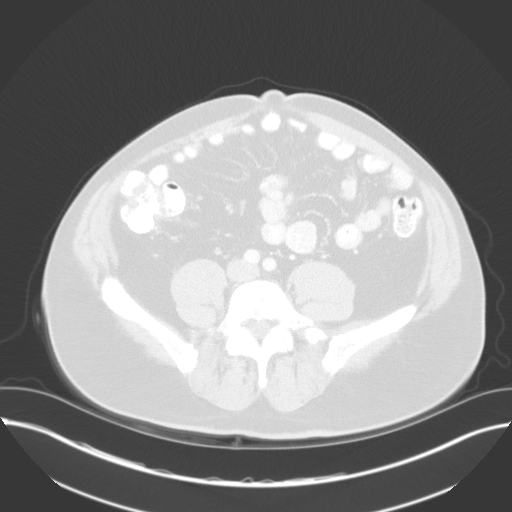
[im 75/137  mediastinal]
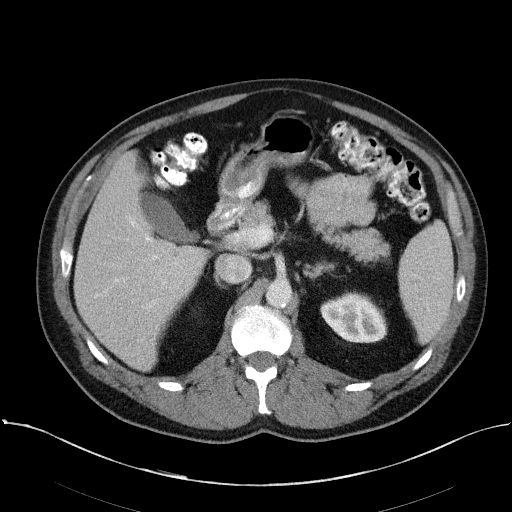
[im 75/137  lung]
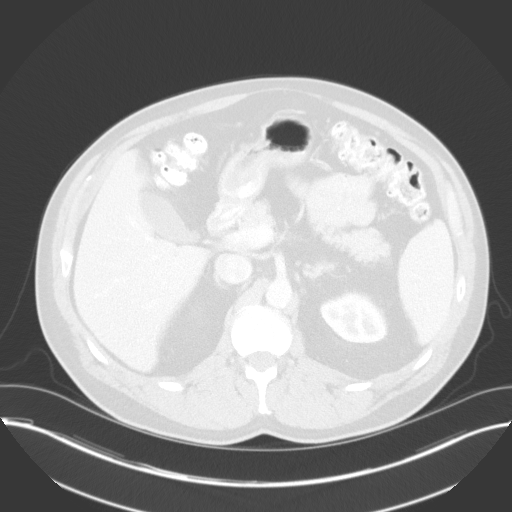
[im 87/137  lung]
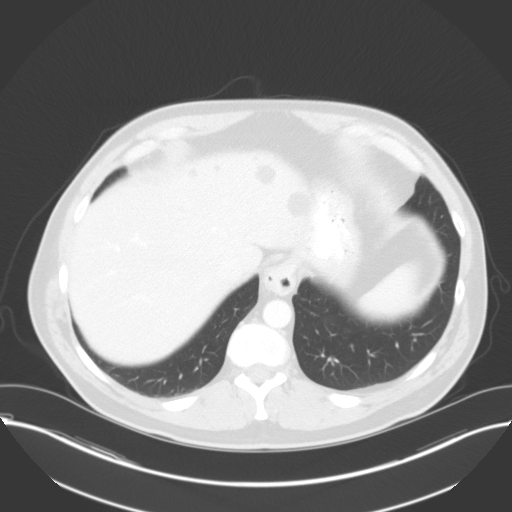
[im 99/137  lung]
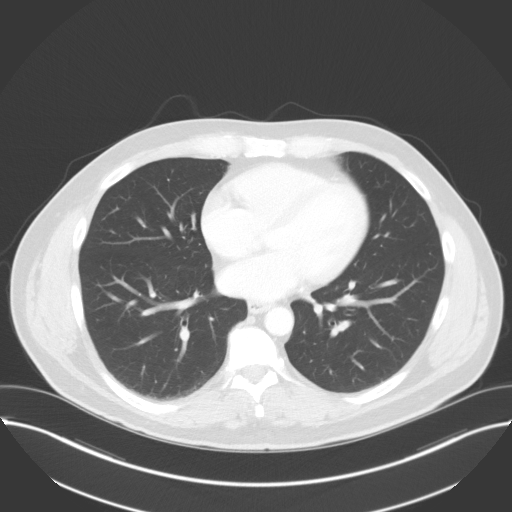
[im 112/137  lung]
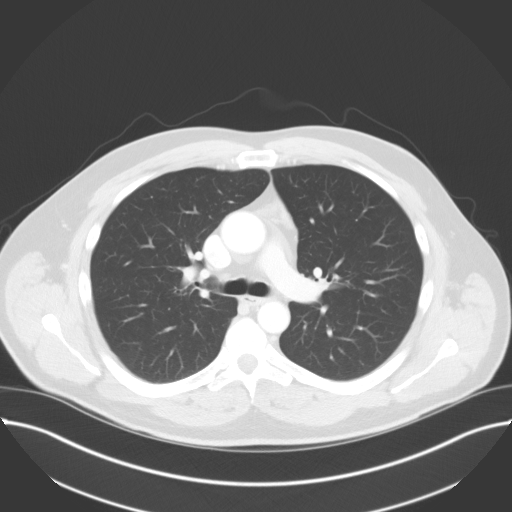
[im 124/137  mediastinal]
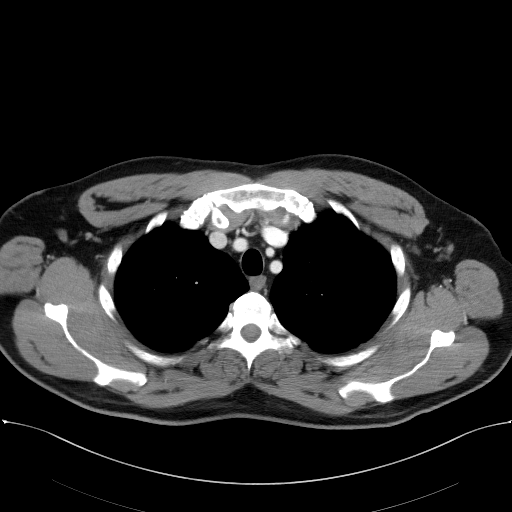
[im 124/137  lung]
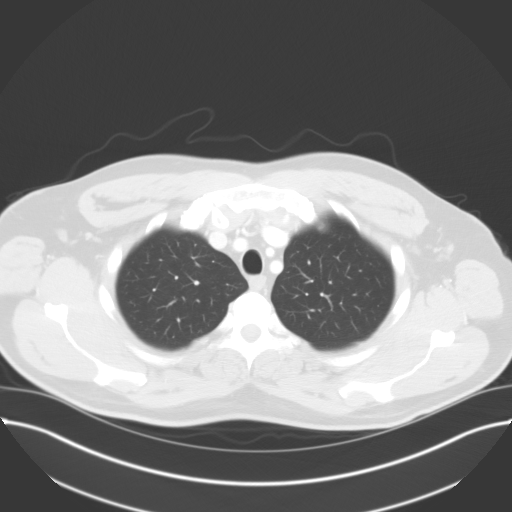

[Series 5: coronals · coronal · 0.86mm/px · 3 of 151 slices shown]
[im 31/151  lung]
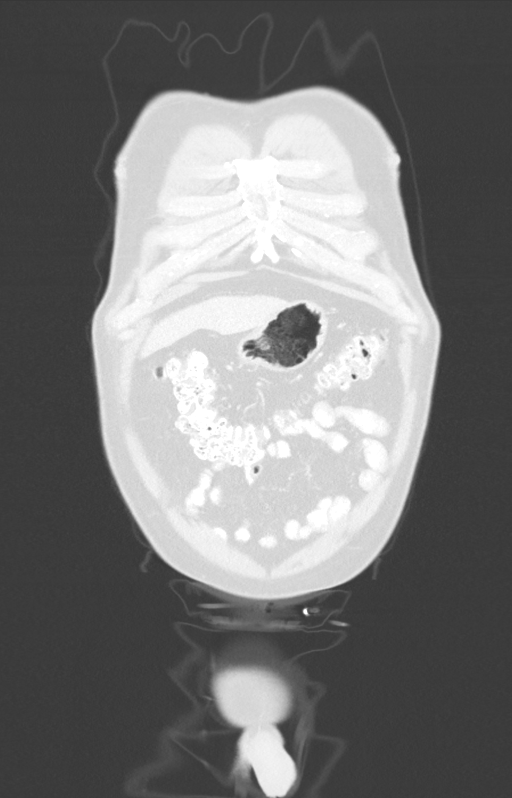
[im 61/151  lung]
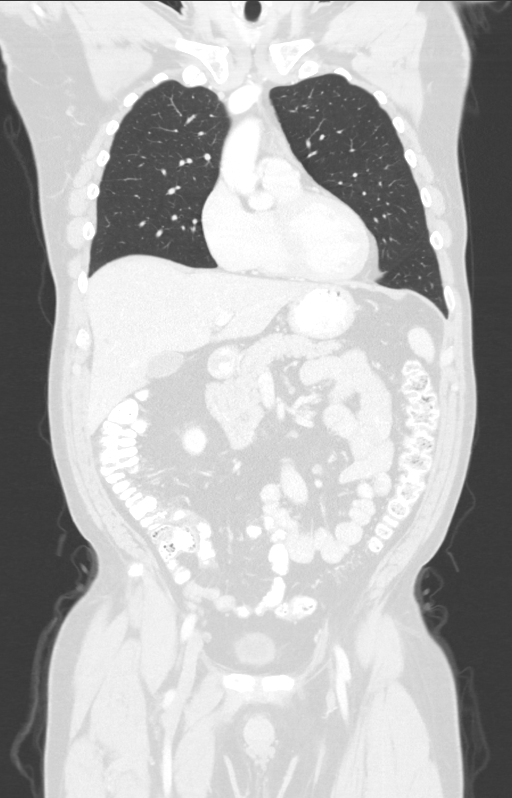
[im 91/151  lung]
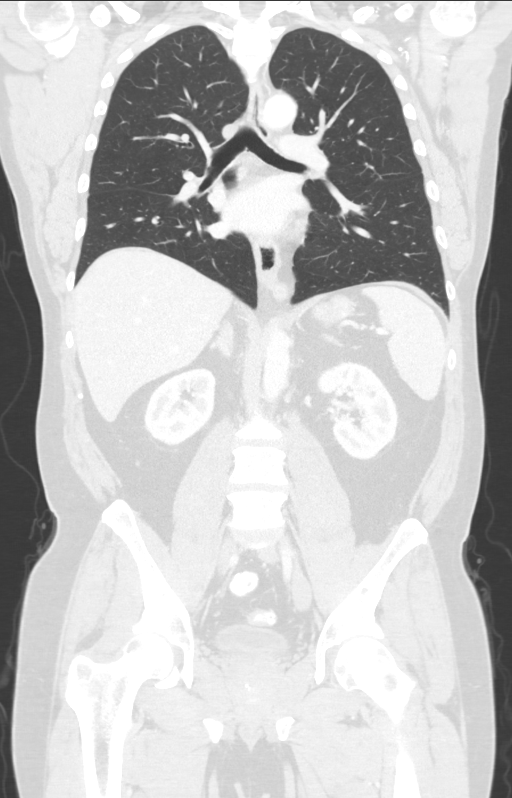

[12 of 36 positions shown; findings below may reference images not displayed]

FINDINGS: CT CHEST FINDINGS

Cardiovascular: There is no appreciable thoracic aortic aneurysm or
dissection. Visualized great vessels appear normal. There are
scattered foci of atherosclerotic calcification in the aorta.
Pericardium is not appreciably thickened. No major vessel pulmonary
embolus is evident.

Mediastinum/Nodes: Visualized thyroid appears normal. There is no
appreciable thoracic adenopathy. There are a few scattered
subcentimeter lymph nodes which do not meet size criteria for
pathologic significance. There is a fairly small hiatal hernia.

Lungs/Pleura: There is slight atelectasis in the inferior lingula.
There is no lung edema or consolidation. There is no appreciable
pleural effusion or pleural thickening.

Musculoskeletal: There are no blastic or lytic bone lesions.

CT ABDOMEN PELVIS FINDINGS

Hepatobiliary: There are scattered cysts throughout the liver. Liver
cysts range in size from as small as 3 mm to as large as 2.2 x
cm. Largest individual cyst is in the left lobe. No noncystic liver
lesions are appreciable. Gallbladder wall is not appreciably
thickened. There is no biliary duct dilatation.

Pancreas: No pancreatic mass or inflammatory focus.

Spleen: Spleen is normal in size and contour. No splenic lesions are
evident.

Adrenals/Urinary Tract: Adrenals appear normal bilaterally. Kidneys
bilaterally show no mass or hydronephrosis on either side. There is
no renal or ureteral calculus on either side. Urinary bladder is
midline with wall thickness within normal limits.

Stomach/Bowel: There is moderate stool in the colon. There is no
appreciable bowel wall or mesenteric thickening. No bowel
obstruction. No free air or portal venous air. There is mild
lipomatous infiltration of the ileocecal valve.

Vascular/Lymphatic: There are foci of atherosclerotic calcification
in the aorta and proximal right common iliac artery. There is mild
plaque in the periphery of the aorta. Major mesenteric vessels
appear patent. No aneurysm evident. There is no appreciable
adenopathy in the abdomen or pelvis. There are subcentimeter
inguinal lymph nodes bilaterally, a finding viewed is nonspecific.

Reproductive: Prostate and seminal vesicles appear normal in size
and contour. No pelvic mass evident.

Other: Appendix appears normal. There is no abscess or ascites in
the abdomen or pelvis. There is a small ventral hernia containing
only fat. There is fat in each inguinal ring.

Musculoskeletal: There are no blastic or lytic bone lesions. No
intramuscular or abdominal wall lesions.
IMPRESSION: CT chest:

1.  No demonstrable adenopathy.

2. Slight atelectasis inferior lingula. No edema or consolidation.
No pleural effusion.

3. Foci of atherosclerotic calcification in the aorta. No evident
aneurysm.

4.  Hiatal hernia present.

CT abdomen and pelvis:

1.  No adenopathy.

2.  Spleen normal size and contour.  No focal splenic lesions.

3. Cysts throughout the liver ranging in size from 3 mm to as large
as 2.2 x 2.2 cm. No noncystic liver lesions.

4. No bowel wall thickening or bowel obstruction. No abscess.
Appendix appears normal.

5.  No renal or ureteral calculus.  No hydronephrosis.

6. Minimal ventral hernia containing only fat. Fat in each inguinal
ring noted.

7.  Aortoiliac atherosclerosis.

Aortic Atherosclerosis ([id number]-DF9.9).

## 2017-12-05 ENCOUNTER — Ambulatory Visit (INDEPENDENT_AMBULATORY_CARE_PROVIDER_SITE_OTHER): Payer: 59

## 2017-12-05 DIAGNOSIS — Z23 Encounter for immunization: Secondary | ICD-10-CM

## 2017-12-21 ENCOUNTER — Other Ambulatory Visit: Payer: Self-pay | Admitting: Family

## 2017-12-21 MED ORDER — LOSARTAN POTASSIUM 50 MG PO TABS: ORAL_TABLET | ORAL | 1 refills | Status: DC

## 2018-03-18 ENCOUNTER — Other Ambulatory Visit: Payer: Self-pay | Admitting: Family

## 2018-03-28 ENCOUNTER — Telehealth: Payer: Self-pay | Admitting: *Deleted

## 2018-03-28 NOTE — Telephone Encounter (Signed)
Left message for pt to return my call regarding 05/30/18 office visit. Will discuss webex option when pt returns my call.

## 2018-03-30 ENCOUNTER — Ambulatory Visit (INDEPENDENT_AMBULATORY_CARE_PROVIDER_SITE_OTHER): Payer: 59 | Admitting: Family

## 2018-03-30 ENCOUNTER — Other Ambulatory Visit: Payer: Self-pay

## 2018-03-30 DIAGNOSIS — J302 Other seasonal allergic rhinitis: Secondary | ICD-10-CM

## 2018-03-30 DIAGNOSIS — M722 Plantar fascial fibromatosis: Secondary | ICD-10-CM

## 2018-03-30 DIAGNOSIS — E785 Hyperlipidemia, unspecified: Secondary | ICD-10-CM

## 2018-03-30 DIAGNOSIS — I10 Essential (primary) hypertension: Secondary | ICD-10-CM | POA: Diagnosis not present

## 2018-03-30 NOTE — Progress Notes (Signed)
Virtual Visit via Video Note  I connected with Richard Henderson on 03/30/18 at  7:40 AM EDT by a video enabled telemedicine application and verified that I am speaking with the correct person using two identifiers.   I discussed the limitations of evaluation and management by telemedicine and the availability of in person appointments. The patient expressed understanding and agreed to proceed.   History of Present Illness:  Patient is a 58 yr old male who presents today for routine follow up.  Hyperlipidemia- maintained on atorvastatin.  Denies myalgia. Lab Results  Component Value Date   CHOL 181 09/29/2017   HDL 42.40 09/29/2017   LDLCALC 108 (H) 09/29/2017   TRIG 153.0 (H) 09/29/2017   CHOLHDL 4 09/29/2017    HTN-he is maintained on losartan.  He is not checking blood pressure at home. BP Readings from Last 3 Encounters:  09/29/17 132/74  06/09/17 136/76  03/30/17 136/75   Allergic Rhinitis- maintained on zyrtec.  He reports that he has been out of Flonase for a bit and has noticed increase in his symptoms.  He recently restarted Flonase.    Heel pain-patient reports that he believes he has had some plantar fasciitis.  It has been going on for about 2-1/2 months.  Recently he notes improvement in his symptoms.      Observations/Objective: General: Awake, alert, no acute distress. Respiratory: Breath sounds are clear to auscultation bilaterally without wheezes, rales, or rhonchi Psychiatric: calm pleasant demeanor, judgement appears intact.  Assessment and Plan:  HTN- I have asked the patient to purchase an otc bp cuff and check his bp a few times a week, then send me his readings. Continue diovan.  Allergic rhinitis- advised pt to continue zyrtec and flonase and call if symptoms do not improve with recent restart of flonase.  Hyperlipidemia- tolerating statin, lipids stable. Continue statin.  Plantar fasciitis- improving. Discussed stretching and icing.  Offered  NSAID but he declines as he feels like his symptoms are improved.   Follow Up Instructions:  Pt is advised to follow up in 3 months. Plan lab work at that time. I discussed the assessment and treatment plan with the patient. The patient was provided an opportunity to ask questions and all were answered. The patient agreed with the plan and demonstrated an understanding of the instructions.   The patient was advised to call back or seek an in-person evaluation if the symptoms worsen or if the condition fails to improve as anticipated.  I provided 10 minutes of non-face-to-face time during this encounter.   Nance Pear, NP

## 2018-04-11 ENCOUNTER — Other Ambulatory Visit: Payer: Self-pay

## 2018-04-11 ENCOUNTER — Encounter: Payer: Self-pay | Admitting: Medical

## 2018-04-11 ENCOUNTER — Ambulatory Visit (INDEPENDENT_AMBULATORY_CARE_PROVIDER_SITE_OTHER): Payer: Self-pay | Admitting: Medical

## 2018-04-11 ENCOUNTER — Other Ambulatory Visit: Payer: Self-pay | Admitting: Family

## 2018-04-11 DIAGNOSIS — T7840XA Allergy, unspecified, initial encounter: Secondary | ICD-10-CM

## 2018-04-11 MED ORDER — HYDROXYZINE HCL 25 MG PO TABS: 25.0000 mg | ORAL_TABLET | Freq: Three times a day (TID) | ORAL | 0 refills | Status: DC | PRN

## 2018-04-11 MED ORDER — PREDNISONE 10 MG (21) PO TBPK: ORAL_TABLET | ORAL | 0 refills | Status: DC

## 2018-04-11 NOTE — Progress Notes (Signed)
   Subjective:    Patient ID: LUCIAN BASWELL, male    DOB: 05-13-1960, 58 y.o.   MRN: [id number]  HPI Virtual Visit via Video Note  I connected with Sandford Craze on 04/11/18 at  3:00 PM EDT by a video enabled telemedicine application and verified that I am speaking with the correct person using two identifiers.(he showed me rash on forearm, and abdomen). Volume on my phone was not working so I called him.   I discussed the limitations of evaluation and management by telemedicine and the availability of in person appointments. The patient expressed understanding and agreed to proceed.   History of Present Illness:  Pt states rash on his rt elbow and rt forearm area. He states last Thursday got into poison  Ivy around his house.. Pt states he used a special wash called tecmu wash and calamine but rash still persists.. Last 2 days some rash over upper abdomen. Some rash on his left forearm as well but much smaller area.  Pt is not diabetic.   He states itch is not keeping him up at night.     Observations/Objective: On video screen moderate pinkish rash present with mild dry appearance. Also on inspection of upper abdomen same appearance of rash present.   Assessment and Plan: It does appear that  have probable allergic reaction to poison ivy as he described and how it appeared on video.  Recommend sending in 6-day taper dose of prednisone and making hydroxyzine available for itching to use if needed.  Also patient can continue calamine lotion.  Did explain to him that a lot of times we do give additional steroid injection in the office and that could be a possibility later this week or early next week if rash persists.  Patient expressed understanding and he will update [place] by my chart as to how it is in about 5 to 6 days.  Follow Up Instructions:    I discussed the assessment and treatment plan with the patient. The patient was provided an opportunity to ask questions and all were  answered. The patient agreed with the plan and demonstrated an understanding of the instructions.   The patient was advised to call back or seek an in-person evaluation if the symptoms worsen or if the condition fails to improve as anticipated.  I provided 15 minutes of non-face-to-face time during this encounter.   Mackie Pai, PA-C    Review of Systems  Constitutional: Negative for chills, fatigue and fever.  HENT: Negative for congestion and ear discharge.   Respiratory: Negative for cough, chest tightness, shortness of breath and wheezing.   Skin: Positive for rash.       With itching.  Neurological: Negative for dizziness.  Hematological: Negative for adenopathy. Does not bruise/bleed easily.       Objective:   Physical Exam        Assessment & Plan:

## 2018-04-11 NOTE — Patient Instructions (Signed)
It does appear that  have probable allergic reaction to poison ivy as he described and how it appeared on video.  Recommend sending in 6-day taper dose of prednisone and making hydroxyzine available for itching to use if needed.  Also patient can continue calamine lotion.  Did explain to him that a lot of times we do give additional steroid injection in the office and that could be a possibility later this week or early next week if rash persists.  Patient expressed understanding and he will update [place] by my chart as to how it is in about 5 to 6 days.

## 2018-05-21 ENCOUNTER — Telehealth: Payer: Self-pay

## 2018-05-21 NOTE — Telephone Encounter (Signed)
Copied from [place] [phone]. Topic: General - Other >> May 21, 2018  2:27 PM Carolyn Stare wrote:  Pt did not pick up the below RX in [place] this be resent please    valsartan (DIOVAN) 40 MG tablet  He was requesting a  RX for losartan but it is showing discontinued   [facility]

## 2018-05-22 ENCOUNTER — Other Ambulatory Visit: Payer: Self-pay

## 2018-05-22 MED ORDER — VALSARTAN 40 MG PO TABS: 40.0000 mg | ORAL_TABLET | Freq: Every day | ORAL | 1 refills | Status: DC

## 2018-05-22 NOTE — Telephone Encounter (Signed)
Medication renewed for patient to pick up as Diovan 40 mg a day.

## 2018-06-12 ENCOUNTER — Encounter: Payer: Self-pay | Admitting: Family

## 2018-06-13 ENCOUNTER — Telehealth (INDEPENDENT_AMBULATORY_CARE_PROVIDER_SITE_OTHER): Payer: Managed Care, Other (non HMO) | Admitting: Family

## 2018-06-13 ENCOUNTER — Encounter: Payer: Self-pay | Admitting: Family

## 2018-06-13 ENCOUNTER — Other Ambulatory Visit: Payer: Self-pay

## 2018-06-13 VITALS — HR 69 | Ht 69.0 in | Wt 209.0 lb

## 2018-06-13 DIAGNOSIS — I10 Essential (primary) hypertension: Secondary | ICD-10-CM | POA: Diagnosis not present

## 2018-06-13 DIAGNOSIS — M25473 Effusion, unspecified ankle: Secondary | ICD-10-CM | POA: Diagnosis not present

## 2018-06-13 MED ORDER — MELOXICAM 7.5 MG PO TABS: 7.5000 mg | ORAL_TABLET | Freq: Every day | ORAL | 0 refills | Status: DC

## 2018-06-13 MED ORDER — FUROSEMIDE 20 MG PO TABS: 20.0000 mg | ORAL_TABLET | Freq: Every day | ORAL | 0 refills | Status: DC | PRN

## 2018-06-13 NOTE — Telephone Encounter (Signed)
I have asked schedulers to reach out to him to schedule an appointment.

## 2018-06-13 NOTE — Progress Notes (Signed)
Virtual Visit via Video Note  I connected with Richard Henderson on 06/13/18 at 11:20 AM EDT by a video enabled telemedicine application and verified that I am speaking with the correct person using two identifiers.  Location: Patient: home Provider: home   I discussed the limitations of evaluation and management by telemedicine and the availability of in person appointments. The patient expressed understanding and agreed to proceed.  History of Present Illness:  Patient is a 58 yr old male who presents today with chief complaint of ankle swelling. Reports that he did not pick up the diovan initially because he still had losartan refills. Swelling seemed to occur when he transitioned to diovan.  He does not have a bp cuff at home so has not be checking his blood pressure. He has been on his feet more due to packing and moving things down from his attic. Notes that he has pain in both ankles but L>R.    Has hx of ankle sprain on left, this was fairly severe and occurred 4-5 yrs ago, bothers him from time to time.  toook ibuprofen this AM which helped this AM.   Denies cp or shortness of breath. Denies recent travel. Denies any calf tenderness.   Observations/Objective:   Gen: Awake, alert, no acute distress Resp: Breathing is even and non-labored Psych: calm/pleasant demeanor Neuro: Alert and Oriented x 3, + facial symmetry, speech is clear. Ext:  2+ L foot/ankle swelling 1+ right foot/ankle swelling  Assessment and Plan:  Ankle swelling- suspect multifactorial (prolonged standing), underlying OA.  Advised pt as follows:  Add lasix 20mg  once daily for next 2-3 days then prn.   Begin meloxicam for pain. Call if symptoms worsen or if symptoms fail to improve.  HTN- will need follow up BP and bmet when we are able to bring him in for a face to face.   Follow Up Instructions:    I discussed the assessment and treatment plan with the patient. The patient was provided an  opportunity to ask questions and all were answered. The patient agreed with the plan and demonstrated an understanding of the instructions.   The patient was advised to call back or seek an in-person evaluation if the symptoms worsen or if the condition fails to improve as anticipated.

## 2018-06-22 ENCOUNTER — Other Ambulatory Visit (INDEPENDENT_AMBULATORY_CARE_PROVIDER_SITE_OTHER): Payer: Managed Care, Other (non HMO)

## 2018-06-22 ENCOUNTER — Other Ambulatory Visit: Payer: Self-pay

## 2018-06-22 DIAGNOSIS — I10 Essential (primary) hypertension: Secondary | ICD-10-CM

## 2018-06-22 LAB — BASIC METABOLIC PANEL
BUN: 20 mg/dL (ref 6–23)
CO2: 29 mEq/L (ref 19–32)
Calcium: 9 mg/dL (ref 8.4–10.5)
Chloride: 104 mEq/L (ref 96–112)
Creatinine, Ser: 0.96 mg/dL (ref 0.40–1.50)
GFR: 80.39 mL/min (ref >60.00)
Glucose, Bld: 131 mg/dL — ABNORMAL HIGH (ref 70–99)
Potassium: 3.9 mEq/L (ref 3.5–5.1)
Sodium: 140 mEq/L (ref 135–145)

## 2018-06-26 ENCOUNTER — Encounter: Payer: Self-pay | Admitting: Family

## 2018-07-05 ENCOUNTER — Other Ambulatory Visit: Payer: Self-pay | Admitting: Family

## 2018-07-09 ENCOUNTER — Other Ambulatory Visit: Payer: Self-pay | Admitting: Family

## 2018-07-10 MED ORDER — FUROSEMIDE 20 MG PO TABS: 20.0000 mg | ORAL_TABLET | Freq: Every day | ORAL | 1 refills | Status: DC | PRN

## 2018-07-10 NOTE — Addendum Note (Signed)
Addended by: Debbrah Alar on: 07/10/2018 02:30 PM   Modules accepted: Orders

## 2018-07-18 ENCOUNTER — Other Ambulatory Visit: Payer: Self-pay

## 2018-07-18 ENCOUNTER — Ambulatory Visit (INDEPENDENT_AMBULATORY_CARE_PROVIDER_SITE_OTHER): Payer: Managed Care, Other (non HMO) | Admitting: Family

## 2018-07-18 ENCOUNTER — Encounter: Payer: Self-pay | Admitting: Family

## 2018-07-18 VITALS — BP 139/76 | HR 64 | Temp 98.6°F | Resp 16 | Ht 69.0 in | Wt 210.0 lb

## 2018-07-18 DIAGNOSIS — G4733 Obstructive sleep apnea (adult) (pediatric): Secondary | ICD-10-CM | POA: Diagnosis not present

## 2018-07-18 DIAGNOSIS — M25572 Pain in left ankle and joints of left foot: Secondary | ICD-10-CM

## 2018-07-18 DIAGNOSIS — M25571 Pain in right ankle and joints of right foot: Secondary | ICD-10-CM | POA: Diagnosis not present

## 2018-07-18 DIAGNOSIS — E785 Hyperlipidemia, unspecified: Secondary | ICD-10-CM | POA: Diagnosis not present

## 2018-07-18 DIAGNOSIS — I10 Essential (primary) hypertension: Secondary | ICD-10-CM | POA: Diagnosis not present

## 2018-07-18 DIAGNOSIS — R600 Localized edema: Secondary | ICD-10-CM

## 2018-07-18 LAB — BASIC METABOLIC PANEL
BUN: 18 mg/dL (ref 6–23)
CO2: 28 mEq/L (ref 19–32)
Calcium: 8.9 mg/dL (ref 8.4–10.5)
Chloride: 104 mEq/L (ref 96–112)
Creatinine, Ser: 0.9 mg/dL (ref 0.40–1.50)
GFR: 86.59 mL/min (ref >60.00)
Glucose, Bld: 109 mg/dL — ABNORMAL HIGH (ref 70–99)
Potassium: 4.1 mEq/L (ref 3.5–5.1)
Sodium: 139 mEq/L (ref 135–145)

## 2018-07-18 LAB — LIPID PANEL
Cholesterol: 163 mg/dL (ref 0–200)
HDL: 37.1 mg/dL — ABNORMAL LOW (ref >39.00)
LDL Cholesterol: 98 mg/dL (ref 0–99)
NonHDL: 126.02
Total CHOL/HDL Ratio: 4
Triglycerides: 139 mg/dL (ref 0.0–149.0)
VLDL: 27.8 mg/dL (ref 0.0–40.0)

## 2018-07-18 LAB — URIC ACID: Uric Acid, Serum: 5.6 mg/dL (ref 4.0–7.8)

## 2018-07-18 NOTE — Progress Notes (Signed)
Subjective:    Patient ID: Richard Henderson, male    DOB: Nov 11, 1960, 58 y.o.   MRN: [id number]  HPI  Patient is a 58 yr old male who presents today for follow up.  HTN- current meds include diovan 40mg ,  BP Readings from Last 3 Encounters:  07/18/18 139/76  09/29/17 132/74  06/09/17 136/76   Hyperlipidemia- maintained on lipitor.  Lab Results  Component Value Date   CHOL 181 09/29/2017   HDL 42.40 09/29/2017   LDLCALC 108 (H) 09/29/2017   TRIG 153.0 (H) 09/29/2017   CHOLHDL 4 09/29/2017   Last visit we gave him prn lasix for ankle swelling as well as meloxicam as needed for pain.   Reports that he still has some LE ankle pain. L>R. Has hx of remote ankle injury. (4-5 years ago).   OSA- maintained on cpap. Reports that it is working well for him.   Review of Systems Past Medical History:  Diagnosis Date  . Allergic rhinitis   . Cancer [facility]) 2013   colon cancer  . Colonic mass   . Eczema    on hands  . GERD (gastroesophageal reflux disease)    none since cpap  . Hyperlipidemia   . Hypertension   . Serrated adenoma of rectum s/p TEM resection 27Jun2013 07/18/2011  . Sleep apnea    USES C-PAP     Social History   Socioeconomic History  . Marital status: Married    Spouse name: Not on file  . Number of children: 3  . Years of education: Not on file  . Highest education level: Not on file  Occupational History  . Occupation: Dispensing optician  Social Needs  . Financial resource strain: Not on file  . Food insecurity    Worry: Not on file    Inability: Not on file  . Transportation needs    Medical: Not on file    Non-medical: Not on file  Tobacco Use  . Smoking status: Never Smoker  . Smokeless tobacco: Never Used  Substance and Sexual Activity  . Alcohol use: Yes    Alcohol/week: 0.0 standard drinks    Comment: 1  . Drug use: No  . Sexual activity: Not on file  Lifestyle  . Physical activity    Days per week: Not on file    Minutes  per session: Not on file  . Stress: Not on file  Relationships  . Social Herbalist on phone: Not on file    Gets together: Not on file    Attends religious service: Not on file    Active member of club or organization: Not on file    Attends meetings of clubs or organizations: Not on file    Relationship status: Not on file  . Intimate partner violence    Fear of current or ex partner: Not on file    Emotionally abused: Not on file    Physically abused: Not on file    Forced sexual activity: Not on file  Other Topics Concern  . 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    Past Surgical History:  Procedure Laterality Date  . COLONOSCOPY  2013  . POLYPECTOMY    . TRANSANAL ENDOSCOPIC MICROSURGERY  06/30/2011  . [place] EXTRACTION  2012  Family History  Problem Relation Age of Onset  . Prostate cancer Father   . Alzheimer's disease Father   . Arthritis Other   . Colon cancer Neg Hx     No Known Allergies  Current Outpatient Medications on File Prior to Visit  Medication Sig Dispense Refill  . atorvastatin (LIPITOR) 10 MG tablet TAKE 1 TABLET BY MOUTH EVERY DAY 30 tablet 5  . cetirizine (ZYRTEC) 10 MG tablet Take 10 mg by mouth daily.    . fluticasone (FLONASE) 50 MCG/ACT nasal spray Place 2 sprays into both nostrils daily as needed for allergies or rhinitis.    . furosemide (LASIX) 20 MG tablet Take 1 tablet (20 mg total) by mouth daily as needed (swelling). 90 tablet 1  . Multiple Vitamin (MULTIVITAMIN) tablet Take 1 tablet by mouth daily as needed.     . valsartan (DIOVAN) 40 MG tablet Take 1 tablet (40 mg total) by mouth daily. Please specify directions, refills and quantity 90 tablet 1  . meloxicam (MOBIC) 7.5 MG tablet Take 1 tablet (7.5 mg total) by mouth daily. (Patient not taking: Reported on 07/18/2018) 14 tablet 0   No current  facility-administered medications on file prior to visit.     BP 139/76 (BP Location: Right Arm, Patient Position: Sitting, Cuff Size: Small)   Pulse 64   Temp 98.6 F (37 C) (Oral)   Resp 16   Ht 5\' 9"  (1.753 m)   Wt 210 lb (95.3 kg)   SpO2 100%   BMI 31.01 kg/m       Objective:   Physical Exam Constitutional:      General: He is not in acute distress.    Appearance: He is well-developed.  HENT:     Head: Normocephalic and atraumatic.  Cardiovascular:     Rate and Rhythm: Normal rate and regular rhythm.     Heart sounds: No murmur.  Pulmonary:     Effort: Pulmonary effort is normal. No respiratory distress.     Breath sounds: Normal breath sounds. No wheezing or rales.  Musculoskeletal:     Right lower leg: 1+ Edema present.     Left lower leg: 1+ Edema present.  Skin:    General: Skin is warm and dry.  Neurological:     Mental Status: He is alert and oriented to person, place, and time.  Psychiatric:        Behavior: Behavior normal.        Thought Content: Thought content normal.           Assessment & Plan:  LE edema- improved.  Continue furosemide prn.  Bilateral ankle pain- will check uric acid level to screen for gout.  HTN- bp stable on current med. Continue same, obtain follow up lipid panel.  Hyperlipidemia-  Obtain follow up lipid panel. Continue lipitor.   OSA- stable on cpap. Continue same.

## 2018-07-18 NOTE — Patient Instructions (Signed)
Please complete lab work prior to leaving.   

## 2018-09-11 ENCOUNTER — Ambulatory Visit (INDEPENDENT_AMBULATORY_CARE_PROVIDER_SITE_OTHER): Payer: Managed Care, Other (non HMO) | Admitting: Medical

## 2018-09-11 ENCOUNTER — Other Ambulatory Visit: Payer: Self-pay

## 2018-09-11 VITALS — Temp 98.2°F

## 2018-09-11 DIAGNOSIS — R0981 Nasal congestion: Secondary | ICD-10-CM | POA: Diagnosis not present

## 2018-09-11 DIAGNOSIS — R05 Cough: Secondary | ICD-10-CM

## 2018-09-11 DIAGNOSIS — J029 Acute pharyngitis, unspecified: Secondary | ICD-10-CM

## 2018-09-11 DIAGNOSIS — R059 Cough, unspecified: Secondary | ICD-10-CM

## 2018-09-11 MED ORDER — BENZONATATE 100 MG PO CAPS: 100.0000 mg | ORAL_CAPSULE | Freq: Three times a day (TID) | ORAL | 0 refills | Status: DC | PRN

## 2018-09-11 MED ORDER — FLUTICASONE PROPIONATE 50 MCG/ACT NA SUSP: 2.0000 | Freq: Every day | NASAL | 2 refills | Status: DC | PRN

## 2018-09-11 MED ORDER — AZITHROMYCIN 250 MG PO TABS: ORAL_TABLET | ORAL | 0 refills | Status: DC

## 2018-09-11 NOTE — Patient Instructions (Addendum)
You did have various symptoms including cough, sore throat, myalgia, fatigue and mild headache.  Symptoms started over the weekend and you report moderate improvement.  However, you do have some indirect exposure to person with COVID.   I do think it is a good idea for you to get tested through [facility] in [place].  That address was given.  During the interim I do think is a good idea to give you a azithromycin antibiotic to cover for possible throat infection, early sinus infection or even bronchitis.  Think best to go ahead and treat for your  current symptoms since  in person  office visit is restricted.  Also prescription of Flonase for nasal congestion and benzonatate for cough.  Work from [employer] quarantined until we get Covid test results back and we need to see how you respond clinically to treatment.  Follow-up in 7 to 10 days or as needed.

## 2018-09-11 NOTE — Progress Notes (Signed)
Subjective:    Patient ID: Richard Henderson, male    DOB: [birth year], 58 y.o.   MRN: [id number]  HPI  Virtual Visit via Video Note  I connected with Richard Henderson on 09/11/18 at  3:00 PM EDT by a video enabled telemedicine application and verified that I am speaking with the correct person using two identifiers.  Location: Patient: home Provider: office   I discussed the limitations of evaluation and management by telemedicine and the availability of in person appointments. The patient expressed understanding and agreed to proceed.  History of Present Illness: Pt states this weekend he felt sick. He feels better now. He states has sore throat, mild body aches and mild cough.  Pt states felt worse yesterday.  Pt grandson lives with him. 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  Saturday and Sunday he had worse symptoms and moderate to severe fatigue.  Subjective flashes. No diarrhea. No change in baseline sense of smell. Faint mild ha.  No wheezing or sob.  Occasional cough.   Couple of times blew nose and saw faint yellow dc.   Observations/Objective: General-no acute distress, pleasant, oriented. Lungs- on inspection lungs appear unlabored. Neck- no tracheal deviation or jvd on inspection. Neuro- gross motor function appears intact.  Assessment and Plan: You did have various symptoms including cough, sore throat, myalgia, fatigue and mild headache.  Symptoms started over the weekend and you report moderate improvement.  However, you do have some indirect exposure to person with COVID.   I do think it is a good idea for you to get tested through [facility] in [place].  That address was given.  During the interim I do think is a good idea to give you a azithromycin antibiotic to cover for possible throat infection, early sinus infection or even bronchitis.  Think best to go ahead and treat for your  current symptoms since   in person  office visit is restricted.  Also prescription of Flonase for nasal congestion and benzonatate for cough.  Work from [employer] quarantined until we get Covid test results back and we need to see how you respond clinically to treatment.  Follow-up in 7 to 10 days or as needed.  Follow Up Instructions:    I discussed the assessment and treatment plan with the patient. The patient was provided an opportunity to ask questions and all were answered. The patient agreed with the plan and demonstrated an understanding of the instructions.   The patient was advised to call back or seek an in-person evaluation if the symptoms worsen or if the condition fails to improve as anticipated.  I provided 25 minutes of non-face-to-face time during this encounter.   Mackie Pai, PA-C    Review of Systems  Constitutional: Positive for fatigue. Negative for diaphoresis and fever.  HENT: Positive for congestion and sore throat. Negative for ear pain and sneezing.   Respiratory: Positive for cough. Negative for shortness of breath and wheezing.   Cardiovascular: Negative for chest pain and palpitations.  Gastrointestinal: Positive for abdominal pain. Negative for constipation, diarrhea, nausea and vomiting.  Skin: Negative for rash.  Neurological: Positive for headaches. Negative for dizziness, syncope, speech difficulty, weakness and light-headedness.  Hematological: Negative for adenopathy. Does not bruise/bleed easily.  Psychiatric/Behavioral: Negative for behavioral problems and confusion.    Past Medical History:  Diagnosis Date  . Allergic rhinitis   . Cancer [facility]) 2013   colon cancer  . Colonic mass   .  Eczema    on hands  . GERD (gastroesophageal reflux disease)    none since cpap  . Hyperlipidemia   . Hypertension   . Serrated adenoma of rectum s/p TEM resection 27Jun2013 07/18/2011  . Sleep apnea    USES C-PAP     Social History   Socioeconomic History  . Marital  status: Married    Spouse name: Not on file  . Number of children: 3  . Years of education: Not on file  . Highest education level: Not on file  Occupational History  . Occupation: Dispensing optician  Social Needs  . Financial resource strain: Not on file  . Food insecurity    Worry: Not on file    Inability: Not on file  . Transportation needs    Medical: Not on file    Non-medical: Not on file  Tobacco Use  . Smoking status: Never Smoker  . Smokeless tobacco: Never Used  Substance and Sexual Activity  . Alcohol use: Yes    Alcohol/week: 0.0 standard drinks    Comment: 1  . Drug use: No  . Sexual activity: Not on file  Lifestyle  . Physical activity    Days per week: Not on file    Minutes per session: Not on file  . Stress: Not on file  Relationships  . Social Herbalist on phone: Not on file    Gets together: Not on file    Attends religious service: Not on file    Active member of club or organization: Not on file    Attends meetings of clubs or organizations: Not on file    Relationship status: Not on file  . Intimate partner violence    Fear of current or ex partner: Not on file    Emotionally abused: Not on file    Physically abused: Not on file    Forced sexual activity: Not on file  Other Topics Concern  . 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    Past Surgical History:  Procedure Laterality Date  . COLONOSCOPY  2013  . POLYPECTOMY    . TRANSANAL ENDOSCOPIC MICROSURGERY  06/30/2011  . WISDOM TOOTH EXTRACTION  2012    Family History  Problem Relation Age of Onset  . Prostate cancer Father   . Alzheimer's disease Father   . Arthritis Other   . Colon cancer Neg Hx     No Known Allergies  Current Outpatient Medications on File Prior to Visit  Medication Sig Dispense Refill  . atorvastatin (LIPITOR) 10 MG  tablet TAKE 1 TABLET BY MOUTH EVERY DAY 30 tablet 5  . cetirizine (ZYRTEC) 10 MG tablet Take 10 mg by mouth daily.    . furosemide (LASIX) 20 MG tablet Take 1 tablet (20 mg total) by mouth daily as needed (swelling). 90 tablet 1  . Multiple Vitamin (MULTIVITAMIN) tablet Take 1 tablet by mouth daily as needed.     . valsartan (DIOVAN) 40 MG tablet Take 1 tablet (40 mg total) by mouth daily. Please specify directions, refills and quantity 90 tablet 1   No current facility-administered medications on file prior to visit.     Temp 98.2 F (36.8 C)       Objective:   Physical Exam        Assessment &  Plan:

## 2018-09-12 ENCOUNTER — Other Ambulatory Visit: Payer: Self-pay

## 2018-09-12 DIAGNOSIS — Z20822 Contact with and (suspected) exposure to covid-19: Secondary | ICD-10-CM

## 2018-09-12 NOTE — Progress Notes (Unsigned)
l °

## 2018-09-13 LAB — NOVEL CORONAVIRUS, NAA: SARS-CoV-2, NAA: NOT DETECTED

## 2018-11-23 ENCOUNTER — Other Ambulatory Visit: Payer: Self-pay

## 2018-11-23 MED ORDER — VALSARTAN 40 MG PO TABS: 40.0000 mg | ORAL_TABLET | Freq: Every day | ORAL | 1 refills | Status: DC

## 2018-12-12 ENCOUNTER — Other Ambulatory Visit: Payer: Self-pay | Admitting: *Deleted

## 2018-12-12 MED ORDER — FLUTICASONE PROPIONATE 50 MCG/ACT NA SUSP: 2.0000 | Freq: Every day | NASAL | 2 refills | Status: DC | PRN

## 2019-03-18 ENCOUNTER — Other Ambulatory Visit: Payer: Self-pay

## 2019-03-18 MED ORDER — FLUTICASONE PROPIONATE 50 MCG/ACT NA SUSP: 2.0000 | Freq: Every day | NASAL | 0 refills | Status: DC | PRN

## 2019-04-01 ENCOUNTER — Other Ambulatory Visit: Payer: Self-pay | Admitting: Family

## 2019-06-04 ENCOUNTER — Ambulatory Visit (INDEPENDENT_AMBULATORY_CARE_PROVIDER_SITE_OTHER): Payer: Managed Care, Other (non HMO) | Admitting: Family

## 2019-06-04 ENCOUNTER — Encounter: Payer: Self-pay | Admitting: Family

## 2019-06-04 ENCOUNTER — Other Ambulatory Visit: Payer: Self-pay

## 2019-06-04 ENCOUNTER — Telehealth: Payer: Self-pay

## 2019-06-04 VITALS — BP 162/80 | HR 58 | Temp 98.1°F | Resp 16 | Ht 69.0 in | Wt 208.0 lb

## 2019-06-04 DIAGNOSIS — H6993 Unspecified Eustachian tube disorder, bilateral: Secondary | ICD-10-CM

## 2019-06-04 DIAGNOSIS — H6983 Other specified disorders of Eustachian tube, bilateral: Secondary | ICD-10-CM | POA: Diagnosis not present

## 2019-06-04 DIAGNOSIS — I10 Essential (primary) hypertension: Secondary | ICD-10-CM | POA: Diagnosis not present

## 2019-06-04 DIAGNOSIS — J309 Allergic rhinitis, unspecified: Secondary | ICD-10-CM | POA: Diagnosis not present

## 2019-06-04 MED ORDER — AMLODIPINE BESYLATE 5 MG PO TABS: 5.0000 mg | ORAL_TABLET | Freq: Every day | ORAL | 3 refills | Status: DC

## 2019-06-04 MED ORDER — AZELASTINE-FLUTICASONE 137-50 MCG/ACT NA SUSP: 1.0000 | Freq: Two times a day (BID) | NASAL | 5 refills | Status: DC

## 2019-06-04 NOTE — Telephone Encounter (Signed)
PA initiated via Covermymeds; KEY: [id number]. PA approved.  [id number];Review Type:Prior Auth;Coverage Start Date:05/05/2019;Coverage End Date:06/03/2020;

## 2019-06-04 NOTE — Patient Instructions (Signed)
Please stop flonase and instead begin dymista. Continue zyrtec once daily. You may use mucinex as needed for congestion. Stop afrin. Add amlodipine 5mg  once daily for blood pressure.

## 2019-06-04 NOTE — Progress Notes (Signed)
Subjective:    Patient ID: Richard Henderson, male    DOB: [birth year], 59 y.o.   MRN: [id number]  HPI  Patient is a 59 yr old male who presents today with chief complaint of nasal congestion which has been present x 2 weeks. He has also had ear pressure/aching x 4 days. He is using mucinex and afrin but none today. Has also had allergy symptoms. Taking zyrtec. Did not take any zyrtec today.  Nasal drainage is clear. Denies fever or sinus tenderness.   Grandson had a cold 2 weeks ago and tested negative for covid. Denies fever.    BP Readings from Last 3 Encounters:  06/04/19 (!) 162/80  07/18/18 139/76  09/29/17 132/74      Review of Systems    see HPI  Past Medical History:  Diagnosis Date  . Allergic rhinitis   . Cancer [facility]) 2013   colon cancer  . Colonic mass   . Eczema    on hands  . GERD (gastroesophageal reflux disease)    none since cpap  . Hyperlipidemia   . Hypertension   . Serrated adenoma of rectum s/p TEM resection 27Jun2013 07/18/2011  . Sleep apnea    USES C-PAP     Social History   Socioeconomic History  . Marital status: Married    Spouse name: Not on file  . Number of children: 3  . Years of education: Not on file  . Highest education level: Not on file  Occupational History  . Occupation: Dispensing optician  Tobacco Use  . Smoking status: Never Smoker  . Smokeless tobacco: Never Used  Substance and Sexual Activity  . Alcohol use: Yes    Alcohol/week: 0.0 standard drinks    Comment: 1  . Drug use: No  . Sexual activity: Not on file  Other Topics Concern  . 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   Social Determinants of Health   Financial Resource Strain:   . Difficulty of Paying Living Expenses:   Food Insecurity:   . Worried About Charity fundraiser in the Last Year:   . Arboriculturist in the Last  Year:   Transportation Needs:   . Film/video editor (Medical):   [name] Lack of Transportation (Non-Medical):   Physical Activity:   . Days of Exercise per Week:   . Minutes of Exercise per Session:   Stress:   . Feeling of Stress :   Social Connections:   . Frequency of Communication with Friends and Family:   . Frequency of Social Gatherings with Friends and Family:   . Attends Religious Services:   . Active Member of Clubs or Organizations:   . Attends Archivist Meetings:   [name] Marital Status:   Intimate Partner Violence:   . Fear of Current or Ex-Partner:   . Emotionally Abused:   [name] Physically Abused:   . Sexually Abused:     Past Surgical History:  Procedure Laterality Date  . COLONOSCOPY  2013  . POLYPECTOMY    . TRANSANAL ENDOSCOPIC MICROSURGERY  06/30/2011  . WISDOM TOOTH EXTRACTION  2012    Family History  Problem Relation Age of Onset  . Prostate cancer Father   . Alzheimer's disease Father   . Arthritis Other   . Colon cancer  Neg Hx     No Known Allergies  Current Outpatient Medications on File Prior to Visit  Medication Sig Dispense Refill  . atorvastatin (LIPITOR) 10 MG tablet TAKE 1 TABLET BY MOUTH EVERY DAY 30 tablet 5  . cetirizine (ZYRTEC) 10 MG tablet Take 10 mg by mouth daily.    . fluticasone (FLONASE) 50 MCG/ACT nasal spray PLACE 2 SPRAYS INTO BOTH NOSTRILS DAILY AS NEEDED FOR ALLERGIES OR RHINITIS. NEEDS FOLLOW UP APPT 48 mL 1  . Multiple Vitamin (MULTIVITAMIN) tablet Take 1 tablet by mouth daily as needed.     . valsartan (DIOVAN) 40 MG tablet Take 1 tablet (40 mg total) by mouth daily. Please specify directions, refills and quantity 90 tablet 1   No current facility-administered medications on file prior to visit.    BP (!) 162/80 (BP Location: Right Arm, Patient Position: Sitting, Cuff Size: Small)   Pulse (!) 58   Temp 98.1 F (36.7 C) (Temporal)   Resp 16   Ht 5\' 9"  (1.753 m)   Wt 208 lb (94.3 kg)   SpO2 99%   BMI 30.72  kg/m    Objective:   Physical Exam Constitutional:      General: He is not in acute distress.    Appearance: He is well-developed.  HENT:     Head: Normocephalic and atraumatic.     Right Ear: External ear normal. Tympanic membrane is retracted. Tympanic membrane is not erythematous.     Left Ear: External ear normal. Tympanic membrane is retracted. Tympanic membrane is not erythematous.     Ears:     Comments: Bilateral TM's are dull  + osteoma noted left ear canal anterior to TM at 4 oclock    Nose:     Right Sinus: No maxillary sinus tenderness or frontal sinus tenderness.     Left Sinus: No maxillary sinus tenderness or frontal sinus tenderness.  Cardiovascular:     Rate and Rhythm: Normal rate and regular rhythm.     Heart sounds: No murmur.  Pulmonary:     Effort: Pulmonary effort is normal. No respiratory distress.     Breath sounds: Normal breath sounds. No wheezing or rales.  Skin:    General: Skin is warm and dry.  Neurological:     Mental Status: He is alert and oriented to person, place, and time.  Psychiatric:        Behavior: Behavior normal.        Thought Content: Thought content normal.           Assessment & Plan:  Eustachian tube dysfunction/allergic rhinitis- d/c flonase, start dymista, continue zyrtec daily, prn mucinex. He is advised to call if symptoms worsen or if symptoms fail to improve.   HTN- uncontrolled. Advised pt to avoid use of afrin.  Continue valsartan, add amlodipine. Follow up in 1 week for recheck.   This visit occurred during the SARS-CoV-2 public health emergency.  Safety protocols were in place, including screening questions prior to the visit, additional usage of staff PPE, and extensive cleaning of exam room while observing appropriate contact time as indicated for disinfecting solutions.

## 2019-06-05 ENCOUNTER — Encounter: Payer: Self-pay | Admitting: Family

## 2019-06-12 ENCOUNTER — Ambulatory Visit (INDEPENDENT_AMBULATORY_CARE_PROVIDER_SITE_OTHER): Payer: Managed Care, Other (non HMO) | Admitting: Family

## 2019-06-12 ENCOUNTER — Ambulatory Visit: Payer: Managed Care, Other (non HMO) | Admitting: Family

## 2019-06-12 ENCOUNTER — Encounter: Payer: Self-pay | Admitting: Family

## 2019-06-12 ENCOUNTER — Other Ambulatory Visit: Payer: Self-pay

## 2019-06-12 VITALS — BP 140/81 | HR 58 | Temp 97.8°F | Resp 16 | Ht 69.0 in | Wt 209.0 lb

## 2019-06-12 DIAGNOSIS — J309 Allergic rhinitis, unspecified: Secondary | ICD-10-CM | POA: Diagnosis not present

## 2019-06-12 DIAGNOSIS — H6980 Other specified disorders of Eustachian tube, unspecified ear: Secondary | ICD-10-CM

## 2019-06-12 DIAGNOSIS — I10 Essential (primary) hypertension: Secondary | ICD-10-CM | POA: Diagnosis not present

## 2019-06-12 DIAGNOSIS — H6990 Unspecified Eustachian tube disorder, unspecified ear: Secondary | ICD-10-CM

## 2019-06-12 NOTE — Progress Notes (Signed)
Subjective:    Patient ID: Richard Henderson, male    DOB: December 23, 1960, 59 y.o.   MRN: [id number]  HPI  Patient is a 59 yr old male who presents today for follow up of his blood pressure. Last visit we recommended discontinuation of afrin and addition of amlodipine.   BP Readings from Last 3 Encounters:  06/12/19 140/81  06/04/19 (!) 162/80  07/18/18 139/76   Allergic rhinitis- Reports improvement in his nasal congestion and resolution of ear pain. Although it was expensive, he did purchase and start dymista.    Review of Systems See HPI  Past Medical History:  Diagnosis Date  . Allergic rhinitis   . Cancer [facility]) 2013   colon cancer  . Colonic mass   . Eczema    on hands  . GERD (gastroesophageal reflux disease)    none since cpap  . Hyperlipidemia   . Hypertension   . Serrated adenoma of rectum s/p TEM resection 27Jun2013 07/18/2011  . Sleep apnea    USES C-PAP     Social History   Socioeconomic History  . Marital status: Married    Spouse name: Not on file  . Number of children: 3  . Years of education: Not on file  . Highest education level: Not on file  Occupational History  . Occupation: Dispensing optician  Tobacco Use  . Smoking status: Never Smoker  . Smokeless tobacco: Never Used  Substance and Sexual Activity  . Alcohol use: Yes    Alcohol/week: 0.0 standard drinks    Comment: 1  . Drug use: No  . Sexual activity: Not on file  Other Topics Concern  . 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   Social Determinants of Health   Financial Resource Strain:   . Difficulty of Paying Living Expenses:   Food Insecurity:   . Worried About Charity fundraiser in the Last Year:   . Arboriculturist in the Last Year:   Transportation Needs:   . Film/video editor (Medical):   [name] Lack of Transportation (Non-Medical):     Physical Activity:   . Days of Exercise per Week:   . Minutes of Exercise per Session:   Stress:   . Feeling of Stress :   Social Connections:   . Frequency of Communication with Friends and Family:   . Frequency of Social Gatherings with Friends and Family:   . Attends Religious Services:   . Active Member of Clubs or Organizations:   . Attends Archivist Meetings:   [name] Marital Status:   Intimate Partner Violence:   . Fear of Current or Ex-Partner:   . Emotionally Abused:   [name] Physically Abused:   . Sexually Abused:     Past Surgical History:  Procedure Laterality Date  . COLONOSCOPY  2013  . POLYPECTOMY    . TRANSANAL ENDOSCOPIC MICROSURGERY  06/30/2011  . WISDOM TOOTH EXTRACTION  2012    Family History  Problem Relation Age of Onset  . Prostate cancer Father   . Alzheimer's disease Father   . Arthritis Other   . Colon cancer Neg Hx     No Known Allergies  Current Outpatient Medications on File Prior to Visit  Medication Sig Dispense Refill  . amLODipine (NORVASC) 5 MG tablet Take 1  tablet (5 mg total) by mouth daily. 30 tablet 3  . atorvastatin (LIPITOR) 10 MG tablet TAKE 1 TABLET BY MOUTH EVERY DAY 30 tablet 5  . Azelastine-Fluticasone 137-50 MCG/ACT SUSP Place 1 spray into the nose in the morning and at bedtime. 23 g 5  . cetirizine (ZYRTEC) 10 MG tablet Take 10 mg by mouth daily.    . Multiple Vitamin (MULTIVITAMIN) tablet Take 1 tablet by mouth daily as needed.     . valsartan (DIOVAN) 40 MG tablet Take 1 tablet (40 mg total) by mouth daily. Please specify directions, refills and quantity 90 tablet 1   No current facility-administered medications on file prior to visit.    BP 140/81 (BP Location: Right Arm, Patient Position: Sitting, Cuff Size: Small)   Pulse (!) 58   Temp 97.8 F (36.6 C) (Temporal)   Resp 16   Ht 5\' 9"  (1.753 m)   Wt 209 lb (94.8 kg)   SpO2 100%   BMI 30.86 kg/m       Objective:   Physical Exam Constitutional:       General: He is not in acute distress.    Appearance: He is well-developed.  HENT:     Head: Normocephalic and atraumatic.  Cardiovascular:     Rate and Rhythm: Normal rate and regular rhythm.     Heart sounds: No murmur.  Pulmonary:     Effort: Pulmonary effort is normal. No respiratory distress.     Breath sounds: Normal breath sounds. No wheezing or rales.  Skin:    General: Skin is warm and dry.  Neurological:     Mental Status: He is alert and oriented to person, place, and time.  Psychiatric:        Behavior: Behavior normal.        Thought Content: Thought content normal.           Assessment & Plan:  HTN-  bp is improved with the addition of amlodipine. Advised pt to continue current meds (including valsartan) and continue to watch his blood pressure at home. He is advised to notify us if he begins to see readings >140/90.  Allergic rhinitis/eustachian tube dyfunction- improved. Continue dymista and zyrtec.  This visit occurred during the SARS-CoV-2 public health emergency.  Safety protocols were in place, including screening questions prior to the visit, additional usage of staff PPE, and extensive cleaning of exam room while observing appropriate contact time as indicated for disinfecting solutions.

## 2019-07-25 ENCOUNTER — Telehealth: Payer: Self-pay | Admitting: *Deleted

## 2019-07-25 MED ORDER — ATORVASTATIN CALCIUM 10 MG PO TABS: 10.0000 mg | ORAL_TABLET | Freq: Every day | ORAL | 5 refills | Status: DC

## 2019-07-25 NOTE — Telephone Encounter (Signed)
Received fax from [facility] requesting atorvastatin 10mg .  Refills sent.

## 2019-09-04 ENCOUNTER — Other Ambulatory Visit: Payer: Self-pay

## 2019-09-04 MED ORDER — VALSARTAN 40 MG PO TABS: 40.0000 mg | ORAL_TABLET | Freq: Every day | ORAL | 1 refills | Status: DC

## 2019-09-13 ENCOUNTER — Other Ambulatory Visit: Payer: Self-pay

## 2019-09-13 ENCOUNTER — Ambulatory Visit (INDEPENDENT_AMBULATORY_CARE_PROVIDER_SITE_OTHER): Payer: Commercial Managed Care - PPO | Admitting: Family

## 2019-09-13 ENCOUNTER — Encounter: Payer: Self-pay | Admitting: Family

## 2019-09-13 VITALS — BP 141/82 | HR 61 | Temp 98.4°F | Resp 16 | Ht 69.0 in | Wt 211.6 lb

## 2019-09-13 DIAGNOSIS — I10 Essential (primary) hypertension: Secondary | ICD-10-CM

## 2019-09-13 DIAGNOSIS — R739 Hyperglycemia, unspecified: Secondary | ICD-10-CM

## 2019-09-13 DIAGNOSIS — G4733 Obstructive sleep apnea (adult) (pediatric): Secondary | ICD-10-CM

## 2019-09-13 DIAGNOSIS — E785 Hyperlipidemia, unspecified: Secondary | ICD-10-CM

## 2019-09-13 NOTE — Patient Instructions (Signed)
Please complete lab work prior to leaving.   

## 2019-09-13 NOTE — Progress Notes (Signed)
Subjective:    Patient ID: Richard Henderson, male    DOB: Dec 27, 1960, 59 y.o.   MRN: [id number]  HPI  Patient is a 59 yr old male who presents today for follow up.  Patient is maintained on diovan and amlodipine. Denies LE edema.   BP Readings from Last 3 Encounters:  06/12/19 140/81  06/04/19 (!) 162/80  07/18/18 139/76   Hyperlipidemia- maintained on atorvastatin.  Lab Results  Component Value Date   CHOL 163 07/18/2018   HDL 37.10 (L) 07/18/2018   LDLCALC 98 07/18/2018   TRIG 139.0 07/18/2018   CHOLHDL 4 07/18/2018   Seasonal allergies- notes increased symptoms this week due to yard work.  Back on zyrtec.    Lab Results  Component Value Date   HGBA1C 5.7 04/05/2017   OSA-  Wearing cpap.  Sees Dr. Elsworth Soho.   More ankle/knee arthritis.       Review of Systems See HPI  Past Medical History:  Diagnosis Date  . Allergic rhinitis   . Cancer [facility]) 2013   colon cancer  . Colonic mass   . Eczema    on hands  . GERD (gastroesophageal reflux disease)    none since cpap  . Hyperlipidemia   . Hypertension   . Serrated adenoma of rectum s/p TEM resection 27Jun2013 07/18/2011  . Sleep apnea    USES C-PAP     Social History   Socioeconomic History  . Marital status: Married    Spouse name: Not on file  . Number of children: 3  . Years of education: Not on file  . Highest education level: Not on file  Occupational History  . Occupation: Dispensing optician  Tobacco Use  . Smoking status: Never Smoker  . Smokeless tobacco: Never Used  Substance and Sexual Activity  . Alcohol use: Yes    Alcohol/week: 0.0 standard drinks    Comment: 1  . Drug use: No  . Sexual activity: Not on file  Other Topics Concern  . 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   Social Determinants of Health   Financial Resource Strain:   . Difficulty of  Paying Living Expenses: Not on file  Food Insecurity:   . Worried About Charity fundraiser in the Last Year: Not on file  . Ran Out of Food in the Last Year: Not on file  Transportation Needs:   . Lack of Transportation (Medical): Not on file  . Lack of Transportation (Non-Medical): Not on file  Physical Activity:   . Days of Exercise per Week: Not on file  . Minutes of Exercise per Session: Not on file  Stress:   . Feeling of Stress : Not on file  Social Connections:   . Frequency of Communication with Friends and Family: Not on file  . Frequency of Social Gatherings with Friends and Family: Not on file  . Attends Religious Services: Not on file  . Active Member of Clubs or Organizations: Not on file  . Attends Archivist Meetings: Not on file  . Marital Status: Not on file  Intimate Partner Violence:   . Fear of Current or Ex-Partner: Not on file  . Emotionally Abused: Not on file  . Physically Abused: Not on file  . Sexually Abused: Not on file    Past  Surgical History:  Procedure Laterality Date  . COLONOSCOPY  2013  . POLYPECTOMY    . TRANSANAL ENDOSCOPIC MICROSURGERY  06/30/2011  . WISDOM TOOTH EXTRACTION  2012    Family History  Problem Relation Age of Onset  . Prostate cancer Father   . Alzheimer's disease Father   . Arthritis Other   . Colon cancer Neg Hx     No Known Allergies  Current Outpatient Medications on File Prior to Visit  Medication Sig Dispense Refill  . amLODipine (NORVASC) 5 MG tablet Take 1 tablet (5 mg total) by mouth daily. 30 tablet 3  . atorvastatin (LIPITOR) 10 MG tablet Take 1 tablet (10 mg total) by mouth daily. 30 tablet 5  . Azelastine-Fluticasone 137-50 MCG/ACT SUSP Place 1 spray into the nose in the morning and at bedtime. 23 g 5  . cetirizine (ZYRTEC) 10 MG tablet Take 10 mg by mouth daily.    . Multiple Vitamin (MULTIVITAMIN) tablet Take 1 tablet by mouth daily as needed.     . valsartan (DIOVAN) 40 MG tablet Take 1  tablet (40 mg total) by mouth daily. Please specify directions, refills and quantity 90 tablet 1   No current facility-administered medications on file prior to visit.    BP (!) 141/82 (BP Location: Right Arm, Patient Position: Sitting, Cuff Size: Small)   Pulse 61   Temp 98.4 F (36.9 C) (Oral)   Resp 16   Ht 5\' 9"  (1.753 m)   Wt 211 lb 9.6 oz (96 kg)   SpO2 100%   BMI 31.25 kg/m       Objective:   Physical Exam Constitutional:      General: He is not in acute distress.    Appearance: He is well-developed.  HENT:     Head: Normocephalic and atraumatic.  Cardiovascular:     Rate and Rhythm: Normal rate and regular rhythm.     Heart sounds: No murmur heard.   Pulmonary:     Effort: Pulmonary effort is normal. No respiratory distress.     Breath sounds: Normal breath sounds. No wheezing or rales.  Skin:    General: Skin is warm and dry.  Neurological:     Mental Status: He is alert and oriented to person, place, and time.  Psychiatric:        Behavior: Behavior normal.        Thought Content: Thought content normal.           Assessment & Plan:  HTN- bp acceptable. Continue current meds. Obtain follow up cmet.  Hyperglycemia- obtain a1c.  Hyperlipidemia- obtain follow up lipid panel.  Continue lipid panel.   OSA- good compliance with cpap. Recommended that he arrange follow up with Dr. Elsworth Soho.   This visit occurred during the SARS-CoV-2 public health emergency.  Safety protocols were in place, including screening questions prior to the visit, additional usage of staff PPE, and extensive cleaning of exam room while observing appropriate contact time as indicated for disinfecting solutions.

## 2019-09-14 LAB — HEMOGLOBIN A1C
Hgb A1c MFr Bld: 5.5 % of total Hgb (ref <5.7)
Mean Plasma Glucose: 111 (calc)
eAG (mmol/L): 6.2 (calc)

## 2019-09-14 LAB — COMPREHENSIVE METABOLIC PANEL
AG Ratio: 1.8 (calc) (ref 1.0–2.5)
ALT: 10 U/L (ref 9–46)
AST: 11 U/L (ref 10–35)
Albumin: 4.2 g/dL (ref 3.6–5.1)
Alkaline phosphatase (APISO): 75 U/L (ref 35–144)
BUN: 15 mg/dL (ref 7–25)
CO2: 28 mmol/L (ref 20–32)
Calcium: 9.2 mg/dL (ref 8.6–10.3)
Chloride: 103 mmol/L (ref 98–110)
Creat: 0.96 mg/dL (ref 0.70–1.33)
Globulin: 2.3 g/dL (calc) (ref 1.9–3.7)
Glucose, Bld: 112 mg/dL — ABNORMAL HIGH (ref 65–99)
Potassium: 4.9 mmol/L (ref 3.5–5.3)
Sodium: 140 mmol/L (ref 135–146)
Total Bilirubin: 0.4 mg/dL (ref 0.2–1.2)
Total Protein: 6.5 g/dL (ref 6.1–8.1)

## 2019-09-14 LAB — LIPID PANEL
Cholesterol: 165 mg/dL (ref <200)
HDL: 38 mg/dL — ABNORMAL LOW (ref >=40)
LDL Cholesterol (Calc): 108 mg/dL (calc) — ABNORMAL HIGH
Non-HDL Cholesterol (Calc): 127 mg/dL (calc) (ref <130)
Total CHOL/HDL Ratio: 4.3 (calc) (ref <5.0)
Triglycerides: 101 mg/dL (ref <150)

## 2019-09-16 ENCOUNTER — Other Ambulatory Visit: Payer: Self-pay

## 2019-09-16 ENCOUNTER — Other Ambulatory Visit: Payer: Commercial Managed Care - PPO

## 2019-09-16 ENCOUNTER — Other Ambulatory Visit: Payer: Self-pay | Admitting: Family

## 2019-09-16 DIAGNOSIS — Z20822 Contact with and (suspected) exposure to covid-19: Secondary | ICD-10-CM

## 2019-09-17 LAB — NOVEL CORONAVIRUS, NAA: SARS-CoV-2, NAA: NOT DETECTED

## 2019-09-17 LAB — SARS-COV-2, NAA 2 DAY TAT

## 2019-10-02 ENCOUNTER — Other Ambulatory Visit: Payer: Self-pay

## 2019-10-02 DIAGNOSIS — Z20822 Contact with and (suspected) exposure to covid-19: Secondary | ICD-10-CM

## 2019-10-03 LAB — NOVEL CORONAVIRUS, NAA: SARS-CoV-2, NAA: NOT DETECTED

## 2019-10-03 LAB — SARS-COV-2, NAA 2 DAY TAT

## 2019-10-30 ENCOUNTER — Other Ambulatory Visit: Payer: Self-pay | Admitting: Family

## 2019-11-07 ENCOUNTER — Encounter: Payer: Self-pay | Admitting: Family

## 2019-11-08 ENCOUNTER — Encounter: Payer: Self-pay | Admitting: Family

## 2019-12-13 ENCOUNTER — Ambulatory Visit (INDEPENDENT_AMBULATORY_CARE_PROVIDER_SITE_OTHER): Payer: Managed Care, Other (non HMO) | Admitting: Family

## 2019-12-13 ENCOUNTER — Encounter: Payer: Self-pay | Admitting: Family

## 2019-12-13 ENCOUNTER — Other Ambulatory Visit: Payer: Self-pay

## 2019-12-13 VITALS — BP 131/67 | HR 70 | Temp 98.2°F | Resp 16 | Wt 211.3 lb

## 2019-12-13 DIAGNOSIS — Z0000 Encounter for general adult medical examination without abnormal findings: Secondary | ICD-10-CM

## 2019-12-13 NOTE — Progress Notes (Signed)
Subjective:    Patient ID: Richard Henderson, male    DOB: 09/08/1960, 59 y.o.   MRN: [id number]  HPI  Patient presents today for complete physical.  Immunizations: flu shot up to date, had J and J followed by Levan Hurst booster. Shingrix x 2 Diet: fair Wt Readings from Last 3 Encounters:  12/13/19 211 lb 4.8 oz (95.8 kg)  09/13/19 211 lb 9.6 oz (96 kg)  06/12/19 209 lb (94.8 kg)  Exercise:  Started biking/jogging  Colonoscopy: 07/09/15- due 7/22 Vision: up to date Dental:  Up to date Lab Results  Component Value Date   PSA 0.97 09/29/2017   PSA 1.13 09/28/2016   PSA 0.91 09/17/2014      Review of Systems  Constitutional: Negative for unexpected weight change.  HENT: Negative for hearing loss and rhinorrhea.   Eyes: Negative for visual disturbance.  Respiratory: Negative for cough and shortness of breath.   Cardiovascular: Negative for chest pain.  Gastrointestinal: Negative for blood in stool, constipation and diarrhea.  Genitourinary: Negative for difficulty urinating and hematuria.  Musculoskeletal: Positive for arthralgias (had some right ankle pain last week after running).  Skin: Negative for rash.  Neurological: Negative for headaches.  Hematological: Negative for adenopathy.  Psychiatric/Behavioral:       Denies depression/anxiety   Past Medical History:  Diagnosis Date  . Allergic rhinitis   . Cancer [facility]) 2013   colon cancer  . Colonic mass   . Eczema    on hands  . GERD (gastroesophageal reflux disease)    none since cpap  . Hyperlipidemia   . Hypertension   . Serrated adenoma of rectum s/p TEM resection 27Jun2013 07/18/2011  . Sleep apnea    USES C-PAP     Social History   Socioeconomic History  . Marital status: Married    Spouse name: Not on file  . Number of children: 3  . Years of education: Not on file  . Highest education level: Not on file  Occupational History  . Occupation: Dispensing optician  Tobacco Use  . Smoking  status: Never Smoker  . Smokeless tobacco: Never Used  Substance and Sexual Activity  . Alcohol use: Yes    Alcohol/week: 0.0 standard drinks    Comment: 1  . Drug use: No  . Sexual activity: Not on file  Other Topics Concern  . 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   Social Determinants of Health   Financial Resource Strain: Not on file  Food Insecurity: Not on file  Transportation Needs: Not on file  Physical Activity: Not on file  Stress: Not on file  Social Connections: Not on file  Intimate Partner Violence: Not on file    Past Surgical History:  Procedure Laterality Date  . COLONOSCOPY  2013  . POLYPECTOMY    . TRANSANAL ENDOSCOPIC MICROSURGERY  06/30/2011  . WISDOM TOOTH EXTRACTION  2012    Family History  Problem Relation Age of Onset  . Prostate cancer Father   . Alzheimer's disease Father   . Arthritis Other   . Colon cancer Neg Hx     No Known Allergies  Current Outpatient Medications on File Prior to Visit  Medication Sig Dispense Refill  . amLODipine (NORVASC) 5 MG tablet TAKE 1 TABLET(5 MG) BY MOUTH DAILY 90 tablet 1  .  atorvastatin (LIPITOR) 10 MG tablet TAKE 1 TABLET(10 MG) BY MOUTH DAILY 30 tablet 5  . Azelastine-Fluticasone 137-50 MCG/ACT SUSP Place 1 spray into the nose in the morning and at bedtime. 23 g 5  . cetirizine (ZYRTEC) 10 MG tablet Take 10 mg by mouth daily.    . Multiple Vitamin (MULTIVITAMIN) tablet Take 1 tablet by mouth daily as needed.     . valsartan (DIOVAN) 40 MG tablet Take 1 tablet (40 mg total) by mouth daily. Please specify directions, refills and quantity 90 tablet 1   No current facility-administered medications on file prior to visit.    BP 131/67 (BP Location: Right Arm, Patient Position: Sitting, Cuff Size: Small)   Pulse 70   Temp 98.2 F (36.8 C) (Oral)   Resp 16   Wt 211 lb 4.8 oz (95.8 kg)    SpO2 98%   BMI 31.20 kg/m       Objective:   Physical Exam  Physical Exam  Constitutional: He is oriented to person, place, and time. He appears well-developed and well-nourished. No distress.  HENT:  Head: Normocephalic and atraumatic.  Right Ear: Tympanic membrane and ear canal normal.  Left Ear: Tympanic membrane and ear canal normal.  Mouth/Throat: Not examined- pt wearing mask Eyes: Pupils are equal, round, and reactive to light. No scleral icterus.  Neck: Normal range of motion. No thyromegaly present.  Cardiovascular: Normal rate and regular rhythm.   No murmur heard. Pulmonary/Chest: Effort normal and breath sounds normal. No respiratory distress. He has no wheezes. He has no rales. He exhibits no tenderness.  Abdominal: Soft. Bowel sounds are normal. He exhibits no distension and no mass. There is no tenderness. There is no rebound and no guarding.  Musculoskeletal: He exhibits no edema.  Lymphadenopathy:    He has no cervical adenopathy.  Neurological: He is alert and oriented to person, place, and time. He has normal patellar reflexes. He exhibits normal muscle tone. Coordination normal.  Skin: Skin is warm and dry.  Psychiatric: He has a normal mood and affect. His behavior is normal. Judgment and thought content normal.           Assessment & Plan:   Preventative care- discussed healthy diet, exercise and weight loss. Immunizations reviewed and up to date. Lab work up to date.  Colo up to date.   This visit occurred during the SARS-CoV-2 public health emergency.  Safety protocols were in place, including screening questions prior to the visit, additional usage of staff PPE, and extensive cleaning of exam room while observing appropriate contact time as indicated for disinfecting solutions.         Assessment & Plan:

## 2019-12-13 NOTE — Patient Instructions (Signed)
Preventive Care 41-59 Years Old, Male Preventive care refers to lifestyle choices and visits with your health care provider that can promote health and wellness. This includes:  A yearly physical exam. This is also called an annual well check.  Regular dental and eye exams.  Immunizations.  Screening for certain conditions.  Healthy lifestyle choices, such as eating a healthy diet, getting regular exercise, not using drugs or products that contain nicotine and tobacco, and limiting alcohol use. What can I expect for my preventive care visit? Physical exam Your health care provider will check:  Height and weight. These may be used to calculate body mass index (BMI), which is a measurement that tells if you are at a healthy weight.  Heart rate and blood pressure.  Your skin for abnormal spots. Counseling Your health care provider may ask you questions about:  Alcohol, tobacco, and drug use.  Emotional well-being.  Home and relationship well-being.  Sexual activity.  Eating habits.  Work and work Statistician. What immunizations do I need?  Influenza (flu) vaccine  This is recommended every year. Tetanus, diphtheria, and pertussis (Tdap) vaccine  You may need a Td booster every 10 years. Varicella (chickenpox) vaccine  You may need this vaccine if you have not already been vaccinated. Zoster (shingles) vaccine  You may need this after age 64. Measles, mumps, and rubella (MMR) vaccine  You may need at least one dose of MMR if you were born in 1957 or later. You may also need a second dose. Pneumococcal conjugate (PCV13) vaccine  You may need this if you have certain conditions and were not previously vaccinated. Pneumococcal polysaccharide (PPSV23) vaccine  You may need one or two doses if you smoke cigarettes or if you have certain conditions. Meningococcal conjugate (MenACWY) vaccine  You may need this if you have certain conditions. Hepatitis A  vaccine  You may need this if you have certain conditions or if you travel or work in places where you may be exposed to hepatitis A. Hepatitis B vaccine  You may need this if you have certain conditions or if you travel or work in places where you may be exposed to hepatitis B. Haemophilus influenzae type b (Hib) vaccine  You may need this if you have certain risk factors. Human papillomavirus (HPV) vaccine  If recommended by your health care provider, you may need three doses over 6 months. You may receive vaccines as individual doses or as more than one vaccine together in one shot (combination vaccines). Talk with your health care provider about the risks and benefits of combination vaccines. What tests do I need? Blood tests  Lipid and cholesterol levels. These may be checked every 5 years, or more frequently if you are over 60 years old.  Hepatitis C test.  Hepatitis B test. Screening  Lung cancer screening. You may have this screening every year starting at age 43 if you have a 30-pack-year history of smoking and currently smoke or have quit within the past 15 years.  Prostate cancer screening. Recommendations will vary depending on your family history and other risks.  Colorectal cancer screening. All adults should have this screening starting at age 72 and continuing until age 2. Your health care provider may recommend screening at age 14 if you are at increased risk. You will have tests every 1-10 years, depending on your results and the type of screening test.  Diabetes screening. This is done by checking your blood sugar (glucose) after you have not eaten  for a while (fasting). You may have this done every 1-3 years.  Sexually transmitted disease (STD) testing. Follow these instructions at home: Eating and drinking  Eat a diet that includes fresh fruits and vegetables, whole grains, lean protein, and low-fat dairy products.  Take vitamin and mineral supplements as  recommended by your health care provider.  Do not drink alcohol if your health care provider tells you not to drink.  If you drink alcohol: ? Limit how much you have to 0-2 drinks a day. ? Be aware of how much alcohol is in your drink. In the [place] one drink equals one 12 oz bottle of beer (355 mL), one 5 oz glass of wine (148 mL), or one 1 oz glass of hard liquor (44 mL). Lifestyle  Take daily care of your teeth and gums.  Stay active. Exercise for at least 30 minutes on 5 or more days each week.  Do not use any products that contain nicotine or tobacco, such as cigarettes, e-cigarettes, and chewing tobacco. If you need help quitting, ask your health care provider.  If you are sexually active, practice safe sex. Use a condom or other form of protection to prevent STIs (sexually transmitted infections).  Talk with your health care provider about taking a low-dose aspirin every day starting at age 53. What's next?  Go to your health care provider once a year for a well check visit.  Ask your health care provider how often you should have your eyes and teeth checked.  Stay up to date on all vaccines. This information is not intended to replace advice given to you by your health care provider. Make sure you discuss any questions you have with your health care provider. Document Revised: 12/14/2017 Document Reviewed: 12/14/2017 Elsevier Patient Education  2020 [employer].

## 2019-12-20 ENCOUNTER — Telehealth: Payer: Self-pay | Admitting: Pulmonary Disease

## 2019-12-20 ENCOUNTER — Telehealth: Payer: Self-pay | Admitting: Family

## 2019-12-20 NOTE — Telephone Encounter (Signed)
Pt has not been seen in office since 07/28/2016. Pt will need to be started over as a new consult before we can be able to do anything about having DME provide pt a new cpap machine.  Called and spoke with pt letting him know that there was nothing we could do in regards to sending order for him to receive new cpap machine due to last OV we had with pt. Pt verbalized understanding, stated that he was currently in [place] and said that when he returned, he would call office to get appt scheduled. Nothing further needed.

## 2019-12-20 NOTE — Telephone Encounter (Signed)
Patient called into state he lost his CPAP machine while renting a car . Patient believes the CPAP machine was left in the rental car rented and [employer] is giving him the run around. Patient would like to get another CPAP machine.

## 2019-12-25 NOTE — Telephone Encounter (Signed)
Patient confirmed he got his old machine from pulmonology and has an appointment with them tomorrow. He will call us back if there is anything that we need to do.

## 2019-12-25 NOTE — Progress Notes (Signed)
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 Originally saw Dr Elsworth Soho with last ov by NP 2018. He was told he needed to re-establish. NPSG 04/01/10- AHI 25/ hr, desaturation to 79%, body weight 190 lbs -CPAP medium comfort gel mask 12 cm Medical problem list includes HTN, Allergic Rhinitis, Eczema, GERD, Hyperlipidemia, Epworth score 9 CPAP  14/[facility] Body weight today- 209 lbs Download- compliance 100%, AHI 1.9/ hr Covid vax- 1 J&J, 1 Moderna Flu vax- had -----Patient recently traveled recently and his CPAP was lost in transit. States he needs a new one because they are unable to find it. Has been without for a week.  He is aware of loud snoring and wife is making him sleep on couch. ENT surgery- none. Has deviated septum, narrower on right.   Prior to Admission medications   Medication Sig Start Date End Date Taking? Authorizing Provider  amLODipine (NORVASC) 5 MG tablet TAKE 1 TABLET(5 MG) BY MOUTH DAILY 09/16/19  Yes Debbrah Alar, NP  atorvastatin (LIPITOR) 10 MG tablet TAKE 1 TABLET(10 MG) BY MOUTH DAILY 10/30/19  Yes Debbrah Alar, NP  Azelastine-Fluticasone 137-50 MCG/ACT SUSP Place 1 spray into the nose in the morning and at bedtime. 06/04/19  Yes Debbrah Alar, NP  cetirizine (ZYRTEC) 10 MG tablet Take 10 mg by mouth daily.   Yes [provider]  Multiple Vitamin (MULTIVITAMIN) tablet Take 1 tablet by mouth daily as needed.    Yes [provider]  valsartan (DIOVAN) 40 MG tablet Take 1 tablet (40 mg total) by mouth daily. Please specify directions, refills and quantity 09/04/19  Yes Debbrah Alar, NP   Past Medical History:  Diagnosis Date  . Allergic rhinitis   . Cancer [facility]) 2013   colon cancer  . Colonic mass   . Eczema    on hands  . GERD (gastroesophageal reflux disease)    none since cpap  . Hyperlipidemia   . Hypertension   . Serrated adenoma of rectum s/p TEM resection 27Jun2013 07/18/2011  . Sleep  apnea    USES C-PAP   Past Surgical History:  Procedure Laterality Date  . COLONOSCOPY  2013  . POLYPECTOMY    . TRANSANAL ENDOSCOPIC MICROSURGERY  06/30/2011  . WISDOM TOOTH EXTRACTION  2012   Family History  Problem Relation Age of Onset  . Prostate cancer Father   . Alzheimer's disease Father   . Arthritis Other   . Colon cancer Neg Hx    Social History   Socioeconomic History  . Marital status: Married    Spouse name: Not on file  . Number of children: 3  . Years of education: Not on file  . Highest education level: Not on file  Occupational History  . Occupation: Dispensing optician  Tobacco Use  . Smoking status: Never Smoker  . Smokeless tobacco: Never Used  Vaping Use  . Vaping Use: Never used  Substance and Sexual Activity  . Alcohol use: Yes    Alcohol/week: 0.0 standard drinks    Comment: 1  . Drug use: No  . Sexual activity: Not on file  Other Topics Concern  . 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   Social Determinants of Health   Financial Resource Strain: Not on file  Food Insecurity: Not on file  Transportation Needs: Not  on file  Physical Activity: Not on file  Stress: Not on file  Social Connections: Not on file  Intimate Partner Violence: Not on file   ROS-see HPI  + = positive Constitutional:    weight loss, night sweats, fevers, chills, fatigue, lassitude. HEENT:    headaches, difficulty swallowing, tooth/dental problems, sore throat,       sneezing, itching, ear ache,+ nasal congestion, post nasal drip, snoring CV:    chest pain, orthopnea, PND, swelling in lower extremities, anasarca,                                   dizziness, palpitations Resp:   +shortness of breath with exertion or at rest.                productive cough,   non-productive cough, coughing up of blood.              change in color of  mucus.  wheezing.   Skin:    rash or lesions. GI:  +  heartburn, indigestion, abdominal pain, nausea, vomiting, diarrhea,                 change in bowel habits, loss of appetite GU: dysuria, change in color of urine, no urgency or frequency.   flank pain. MS:   joint pain, stiffness, decreased range of motion, back pain. Neuro-     nothing unusual Psych:  change in mood or affect.  depression or anxiety.   memory loss.  OBJ- Physical Exam General- Alert, Oriented, Affect-appropriate, Distress- none acute Skin- rash-none, lesions- none, excoriation- none Lymphadenopathy- none Head- atraumatic            Eyes- Gross vision intact, PERRLA, conjunctivae and secretions clear            Ears- Hearing, canals-normal            Nose- Clear, no-Septal dev, mucus, polyps, erosion, perforation             Throat- Mallampati III , mucosa clear , drainage- none, tonsils- atrophic, + teeth Neck- flexible , trachea midline, no stridor , thyroid nl, carotid no bruit Chest - symmetrical excursion , unlabored           Heart/CV- RRR , no murmur , no gallop  , no rub, nl s1 s2                           - JVD- none , edema- none, stasis changes- none, varices- none           Lung- clear to P&A, wheeze- none, cough- none , dullness-none, rub- none           Chest wall-  Abd-  Br/ Gen/ Rectal- Not done, not indicated Extrem- cyanosis- none, clubbing, none, atrophy- none, strength- nl Neuro- grossly intact to observation

## 2019-12-26 ENCOUNTER — Telehealth: Payer: Self-pay | Admitting: Internal Medicine

## 2019-12-26 ENCOUNTER — Other Ambulatory Visit: Payer: Self-pay

## 2019-12-26 ENCOUNTER — Ambulatory Visit (INDEPENDENT_AMBULATORY_CARE_PROVIDER_SITE_OTHER): Payer: Managed Care, Other (non HMO) | Admitting: Internal Medicine

## 2019-12-26 ENCOUNTER — Encounter: Payer: Self-pay | Admitting: Internal Medicine

## 2019-12-26 DIAGNOSIS — G4733 Obstructive sleep apnea (adult) (pediatric): Secondary | ICD-10-CM

## 2019-12-26 DIAGNOSIS — I10 Essential (primary) hypertension: Secondary | ICD-10-CM

## 2019-12-26 NOTE — Patient Instructions (Signed)
Order- DME [facility]    Please replace old/ lost CPAP machine auto 12-18, mask of choice, humidifier, supplies, AirView/ card. Compliance has been excellent.  Mr Ryner, please call if we can help

## 2019-12-26 NOTE — Telephone Encounter (Signed)
Maybe we could check for him with [facility] and [facility]. If no luck , then let me know and we can generate a printed script he can use on line, like [email or web address]

## 2019-12-26 NOTE — Telephone Encounter (Signed)
ATC [facility] and was on hold for more than 12 min. WCB. lmtcb for Melissa with [facility].

## 2019-12-26 NOTE — Telephone Encounter (Signed)
Called and spoke to pt. Pt states he called [facility] and was informed they do not have any cpaps to give to pt to use or rent. Pt questioning what he should do while waiting for the cpap from [place].   Dr. Annamaria Boots, do you know of another facility that allows rentals/loaners? Pt requesting help in interim while waiting for his replacement.

## 2019-12-30 ENCOUNTER — Telehealth: Payer: Self-pay | Admitting: Internal Medicine

## 2019-12-30 NOTE — Telephone Encounter (Signed)
[facility] is currently closed.  Called Melissa with [facility] and was advised if the pt is within his 5 year period for a replacement then he will need to private pay because insurance typically will not pay for a replacement. However, if his lost machine was older than 5 years then [facility] could accept the referral. [facility] does have machines.   Called [facility] at [phone] and spoke to rep. Was advised pt was issued the lost CPAP in 2018 and she confirmed pt would have to pay out of pocket for a new CPAP.   Will forward to Dr. Maple Hudson to inform. We can then call pt with a plan.

## 2019-12-30 NOTE — Telephone Encounter (Signed)
Patient states CPAP machine is out on back order for [facility]. Would like to know if he can get a travel CPAP machine. Patient phone number is [phone].

## 2019-12-30 NOTE — Telephone Encounter (Signed)
Spoke with patient. He stated that [facility] told him that his machine would not be available for a few months due to them not having in stock. He does not want to wait months to receive his machine. He is interested in getting a travel cpap machine. I advised him that insurance would not cover the travel machine and he would have to pay out of pocket. Advised him to check out [email or web address].com and [email or web address]. He will look into these websites and call us back once he has been made a decision. Will keep this encounter open for his return call.

## 2019-12-30 NOTE — Telephone Encounter (Signed)
See other encounter.

## 2019-12-31 NOTE — Telephone Encounter (Signed)
Spoke with patient. He stated that he went to the [email or web address] and found a machine called Resvent iBreeze APAP. He is interested in this machine and wants to know Dr. Maple Hudson opinion on the machine. I advised him that I would try to print out a copy of the webpage for CY to review. He verbalized understanding.   I will leave a copy of the product on Dr. Roxy Cedar desk. Website is : ResVent iBreeze   Dr. Maple Hudson, please advise. Thanks!

## 2020-01-02 ENCOUNTER — Ambulatory Visit
Admission: EM | Admit: 2020-01-02 | Discharge: 2020-01-02 | Disposition: A | Discharge status: Home / Self Care | Payer: Managed Care, Other (non HMO) | Attending: Family Medicine | Admitting: Family Medicine

## 2020-01-02 DIAGNOSIS — J0100 Acute maxillary sinusitis, unspecified: Secondary | ICD-10-CM | POA: Diagnosis not present

## 2020-01-02 MED ORDER — AMOXICILLIN 500 MG PO CAPS: 1000.0000 mg | ORAL_CAPSULE | Freq: Three times a day (TID) | ORAL | 0 refills | Status: AC

## 2020-01-02 NOTE — ED Triage Notes (Signed)
Pt reports having nasal congestion and drainage in throat that began on Sunday. No other symptoms or concerns at this time.

## 2020-01-02 NOTE — Discharge Instructions (Addendum)
Continue the Flonase and Zyrtec daily.  Amoxicillin as prescribed. Covid test pending

## 2020-01-02 NOTE — ED Provider Notes (Signed)
Richard Henderson    CSN: [id number] Arrival date & time: 01/02/20  7829      History   Chief Complaint Chief Complaint  Patient presents with  . Nasal Congestion    HPI Richard Henderson is a 59 y.o. male.   Patient is a 59 year old male with past medical history of allergic rhinitis, colon cancer, asthma, GERD, hypertension, hyperlipidemia, sleep apnea uses CPAP.  He presents today with nasal congestion, sinus pressure, headache, drainage and cough.  Started Sunday.  Symptoms have worsened.  Fevers and chills at nighttime.  History of sinus infections.  Did recently travel from [place].  Unsure of Covid.  Taking Flonase and Zyrtec without any relief.     Past Medical History:  Diagnosis Date  . Allergic rhinitis   . Cancer [facility]) 2013   colon cancer  . Colonic mass   . Eczema    on hands  . GERD (gastroesophageal reflux disease)    none since cpap  . Hyperlipidemia   . Hypertension   . Serrated adenoma of rectum s/p TEM resection 27Jun2013 07/18/2011  . Sleep apnea    USES C-PAP    Patient Active Problem List   Diagnosis Date Noted  . Eczema 03/20/2015  . Hyperglycemia 02/07/2014  . Knee pain, right 10/23/2013  . Erectile dysfunction 03/27/2012  . Serrated adenoma of rectum s/p TEM resection 27Jun2013 07/18/2011  . Colon cancer screening 02/24/2011  . Obstructive sleep apnea 02/16/2010  . NEVI, MULTIPLE 01/25/2010  . Hyperlipidemia 01/25/2010  . Essential hypertension 01/25/2010  . Allergic rhinitis 01/08/2009  . GERD 01/08/2009  . BACK PAIN, THORACIC REGION, LEFT 01/08/2009    Past Surgical History:  Procedure Laterality Date  . COLONOSCOPY  2013  . POLYPECTOMY    . TRANSANAL ENDOSCOPIC MICROSURGERY  06/30/2011  . WISDOM TOOTH EXTRACTION  2012       Home Medications    Prior to Admission medications   Medication Sig Start Date End Date Taking? Authorizing Provider  amoxicillin (AMOXIL) 500 MG capsule Take 2 capsules (1,000 mg total)  by mouth 3 (three) times daily for 5 days. 01/02/20 01/07/20 Yes Nicholas Ossa A, NP  amLODipine (NORVASC) 5 MG tablet TAKE 1 TABLET(5 MG) BY MOUTH DAILY 09/16/19   Sandford Craze, NP  atorvastatin (LIPITOR) 10 MG tablet TAKE 1 TABLET(10 MG) BY MOUTH DAILY 10/30/19   Sandford Craze, NP  Azelastine-Fluticasone 137-50 MCG/ACT SUSP Place 1 spray into the nose in the morning and at bedtime. 06/04/19   Sandford Craze, NP  cetirizine (ZYRTEC) 10 MG tablet Take 10 mg by mouth daily.    [provider]  Multiple Vitamin (MULTIVITAMIN) tablet Take 1 tablet by mouth daily as needed.     [provider]  valsartan (DIOVAN) 40 MG tablet Take 1 tablet (40 mg total) by mouth daily. Please specify directions, refills and quantity 09/04/19   Sandford Craze, NP    Family History Family History  Problem Relation Age of Onset  . Prostate cancer Father   . Alzheimer's disease Father   . Arthritis Other   . Colon cancer Neg Hx     Social History Social History   Tobacco Use  . Smoking status: Never Smoker  . Smokeless tobacco: Never Used  Vaping Use  . Vaping Use: Never used  Substance Use Topics  . Alcohol use: Yes    Alcohol/week: 0.0 standard drinks    Comment: 1  . Drug use: No     Allergies   Patient  has no known allergies.   Review of Systems Review of Systems   Physical Exam Triage Vital Signs ED Triage Vitals  Enc Vitals Group     BP 01/02/20 1001 (!) 145/92     Pulse Rate 01/02/20 1001 74     Resp 01/02/20 1001 18     Temp 01/02/20 1001 98.9 F (37.2 C)     Temp Source 01/02/20 1001 Oral     SpO2 01/02/20 1001 96 %     Weight 01/02/20 1000 210 lb (95.3 kg)     Height 01/02/20 1000 5\' 9"  (1.753 m)     Head Circumference --      Peak Flow --      Pain Score 01/02/20 1000 0     Pain Loc --      Pain Edu? --      Excl. in [place]? --    No data found.  Updated Vital Signs BP (!) 145/92 (BP Location: Left Arm)   Pulse 74   Temp 98.9 F (37.2  C) (Oral)   Resp 18   Ht 5\' 9"  (1.753 m)   Wt 210 lb (95.3 kg)   SpO2 96%   BMI 31.01 kg/m   Visual Acuity Right Eye Distance:   Left Eye Distance:   Bilateral Distance:    Right Eye Near:   Left Eye Near:    Bilateral Near:     Physical Exam Vitals and nursing note reviewed.  Constitutional:      General: He is not in acute distress.    Appearance: Normal appearance. He is not ill-appearing, toxic-appearing or diaphoretic.  HENT:     Head: Normocephalic and atraumatic.     Right Ear: Tympanic membrane and ear canal normal.     Left Ear: Tympanic membrane and ear canal normal.     Nose: Congestion present.     Mouth/Throat:     Pharynx: Oropharynx is clear.  Eyes:     Conjunctiva/sclera: Conjunctivae normal.  Cardiovascular:     Rate and Rhythm: Normal rate and regular rhythm.  Pulmonary:     Effort: Pulmonary effort is normal.     Breath sounds: Normal breath sounds.  Musculoskeletal:        General: Normal range of motion.     Cervical back: Normal range of motion.  Skin:    General: Skin is warm and dry.  Neurological:     Mental Status: He is alert.  Psychiatric:        Mood and Affect: Mood normal.      UC Treatments / Results  Labs (all labs ordered are listed, but only abnormal results are displayed) Labs Reviewed  NOVEL CORONAVIRUS, NAA    EKG   Radiology No results found.  Procedures Procedures (including critical care time)  Medications Ordered in UC Medications - No data to display  Initial Impression / Assessment and Plan / UC Course  I have reviewed the triage vital signs and the nursing notes.  Pertinent labs & imaging results that were available during my care of the patient were reviewed by me and considered in my medical decision making (see chart for details).     Sinusitis Treating for sinus infection with amoxicillin.  Flonase and Zyrtec daily. Covid test pending Follow up as needed for continued or worsening  symptoms  Final Clinical Impressions(s) / UC Diagnoses   Final diagnoses:  Acute non-recurrent maxillary sinusitis     Discharge Instructions     Continue  the Flonase and Zyrtec daily.  Amoxicillin as prescribed. Covid test pending    ED Prescriptions    Medication Sig Dispense Auth. Provider   amoxicillin (AMOXIL) 500 MG capsule Take 2 capsules (1,000 mg total) by mouth 3 (three) times daily for 5 days. 30 capsule Kasandra Fehr A, NP     PDMP not reviewed this encounter.   Orvan July, NP 01/02/20 1019

## 2020-01-06 ENCOUNTER — Telehealth: Payer: Self-pay | Admitting: Internal Medicine

## 2020-01-06 LAB — NOVEL CORONAVIRUS, NAA

## 2020-01-06 NOTE — Telephone Encounter (Signed)
Called and spoke with patient to let him know of recs from Dr. Maple Hudson. He stated he will do his research and let us know if he needs a prescription or order one or if he needs anything else. Expressed understanding and told patient we would wait to hear from him.

## 2020-01-06 NOTE — Telephone Encounter (Signed)
ATC patient to discuss message from Dr. Johna Sheriff will route to triage since it originated there.

## 2020-01-06 NOTE — Telephone Encounter (Signed)
I am not familiar with the Resvent manufacturer or the iBreeze CPAP machine, but the information I have on it looks fine.  His CPAP machine was less than 60 years old, so we don't expect insurance to cover the cost of replacement.  If he wants a CPAP prescription printed anyway, to send when ordering his mail-order CPAP machine, we can do that         (CPAP machine of choice, auto 5-20, mask of choice, humidifier, supplies, SD card/ Airview or Care Everywhere ) He was interested in a Travel CPAP machine. Insurance won't pay for 2 CPAP machines at the same time, but two popular types are          Transcend and AirMini.

## 2020-01-06 NOTE — Telephone Encounter (Signed)
Please see encounter from 12/23. Will close this encounter

## 2020-01-07 ENCOUNTER — Other Ambulatory Visit: Payer: Self-pay

## 2020-01-07 ENCOUNTER — Institutional Professional Consult (permissible substitution): Payer: Managed Care, Other (non HMO) | Admitting: Adult Health

## 2020-01-07 NOTE — Telephone Encounter (Signed)
Spoke with pt, states that he has ordered a cpap for a resmed airsense 10 through [email or web address]  Order number: [id number]   Fax #: [phone]   Also ordered quatro medium face mask, tubing, filters.  Would need this on order as well.    Order placed with above information.  Nothing further needed at this time- will close encounter.

## 2020-01-09 ENCOUNTER — Telehealth: Payer: Self-pay | Admitting: Internal Medicine

## 2020-01-09 DIAGNOSIS — G4733 Obstructive sleep apnea (adult) (pediatric): Secondary | ICD-10-CM

## 2020-01-09 LAB — SARS-COV-2, NAA 2 DAY TAT

## 2020-01-09 LAB — NOVEL CORONAVIRUS, NAA: SARS-CoV-2, NAA: NOT DETECTED

## 2020-01-09 NOTE — Telephone Encounter (Signed)
CY please advise if we can order a travel cpap for this pt.  Thanks  Last OV with CY was 12/26/2019   Order- DME [facility]    Please replace old/ lost CPAP machine auto 12-18, mask of choice, humidifier, supplies, AirView/ card. Compliance has been excellent.  Mr Moes, please call if we can help      Instructions     Return in about 1 year (around 12/25/2020).  Order- DME [facility]    Please replace old/ lost CPAP machine auto 12-18, mask of choice, humidifier, supplies, AirView/ card. Compliance has been excellent.  Mr Capozzoli, please call if we can help

## 2020-01-09 NOTE — Telephone Encounter (Signed)
Yes- same auto 12-18 settings, supplies, download card , mask of choice

## 2020-01-09 NOTE — Telephone Encounter (Signed)
Called and spoke with pt and he is aware of order for the travel cpap and he requested that this go to [email or web address] --fax #  [phone]   Pt is aware that this will be sent in and nothing further is  Needed.

## 2020-01-14 ENCOUNTER — Telehealth: Payer: Self-pay | Admitting: Internal Medicine

## 2020-01-14 NOTE — Telephone Encounter (Signed)
Called and spoke with patient who states that [facility] called and told him that they can send him Resmed air sense 11 CPAP machine. Would like to know if this is ok?  Dr. Annamaria Boots please advise

## 2020-01-14 NOTE — Telephone Encounter (Signed)
If he can't get an autopap machine, then the AirSense 11 is good enough and we can work with it.  Let us know when he has it, so we can work on settings.

## 2020-01-15 NOTE — Telephone Encounter (Signed)
Spoke with the pt and notified of response per Dr Young. Pt verbalized understanding  Nothing further needed 

## 2020-01-21 ENCOUNTER — Institutional Professional Consult (permissible substitution): Payer: Managed Care, Other (non HMO) | Admitting: Pulmonary Disease

## 2020-02-02 NOTE — Assessment & Plan Note (Signed)
We need to see if Richard Henderson is able to replace missing machine, depending on age. He clearly benefits with improved sleep and snoring. Consider change to autopap.

## 2020-02-02 NOTE — Assessment & Plan Note (Signed)
He understands untreated OSA can be associated with increased HTN control issues.

## 2020-03-13 ENCOUNTER — Other Ambulatory Visit: Payer: Self-pay

## 2020-03-13 ENCOUNTER — Encounter: Payer: Self-pay | Admitting: Family

## 2020-03-13 ENCOUNTER — Ambulatory Visit (INDEPENDENT_AMBULATORY_CARE_PROVIDER_SITE_OTHER): Payer: Managed Care, Other (non HMO) | Admitting: Family

## 2020-03-13 VITALS — BP 137/69 | HR 63 | Temp 98.1°F | Resp 16 | Ht 69.0 in | Wt 211.0 lb

## 2020-03-13 DIAGNOSIS — I10 Essential (primary) hypertension: Secondary | ICD-10-CM

## 2020-03-13 DIAGNOSIS — Z125 Encounter for screening for malignant neoplasm of prostate: Secondary | ICD-10-CM | POA: Diagnosis not present

## 2020-03-13 LAB — BASIC METABOLIC PANEL
BUN: 18 mg/dL (ref 6–23)
CO2: 31 mEq/L (ref 19–32)
Calcium: 9.3 mg/dL (ref 8.4–10.5)
Chloride: 102 mEq/L (ref 96–112)
Creatinine, Ser: 1.03 mg/dL (ref 0.40–1.50)
GFR: 79.28 mL/min (ref >60.00)
Glucose, Bld: 116 mg/dL — ABNORMAL HIGH (ref 70–99)
Potassium: 4.5 mEq/L (ref 3.5–5.1)
Sodium: 139 mEq/L (ref 135–145)

## 2020-03-13 LAB — PSA: PSA: 1.01 ng/mL (ref 0.10–4.00)

## 2020-03-13 MED ORDER — AMLODIPINE BESYLATE 5 MG PO TABS: ORAL_TABLET | ORAL | 1 refills | Status: DC

## 2020-03-13 MED ORDER — VALSARTAN 40 MG PO TABS: 40.0000 mg | ORAL_TABLET | Freq: Every day | ORAL | 1 refills | Status: DC

## 2020-03-13 MED ORDER — ATORVASTATIN CALCIUM 10 MG PO TABS: ORAL_TABLET | ORAL | 1 refills | Status: DC

## 2020-03-13 NOTE — Patient Instructions (Signed)
Please complete lab work prior to leaving.   

## 2020-03-13 NOTE — Progress Notes (Signed)
Subjective:    Patient ID: Richard Henderson, male    DOB: 1960/03/13, 60 y.o.   MRN: [id number]  HPI   Patient is a 60 yr old male who presents today for follow up.    HTN- maintained on valsartan 40mg  and amlodipine 5mg .  BP Readings from Last 3 Encounters:  03/13/20 137/69  01/02/20 (!) 145/92  12/26/19 130/80   Hyperlipidemia-  Lab Results  Component Value Date   CHOL 165 09/13/2019   HDL 38 (L) 09/13/2019   LDLCALC 108 (H) 09/13/2019   TRIG 101 09/13/2019   CHOLHDL 4.3 09/13/2019     Lab Results  Component Value Date   PSA 0.97 09/29/2017   PSA 1.13 09/28/2016   PSA 0.91 09/17/2014   Allergic rhinitis- Currently only using flonase.    States that he went to the pool yesterday in [place] and forgot to put on sunscreen so he burned.   Review of Systems See HPI  Past Medical History:  Diagnosis Date  . Allergic rhinitis   . Cancer [facility]) 2013   colon cancer  . Colonic mass   . Eczema    on hands  . GERD (gastroesophageal reflux disease)    none since cpap  . Hyperlipidemia   . Hypertension   . Serrated adenoma of rectum s/p TEM resection 27Jun2013 07/18/2011  . Sleep apnea    USES C-PAP     Social History   Socioeconomic History  . Marital status: Married    Spouse name: Not on file  . Number of children: 3  . Years of education: Not on file  . Highest education level: Not on file  Occupational History  . Occupation: Dispensing optician  Tobacco Use  . Smoking status: Never Smoker  . Smokeless tobacco: Never Used  Vaping Use  . Vaping Use: Never used  Substance and Sexual Activity  . Alcohol use: Yes    Alcohol/week: 0.0 standard drinks    Comment: 1  . Drug use: No  . Sexual activity: Not on file  Other Topics Concern  . 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   Social Determinants of Health   Financial  Resource Strain: Not on file  Food Insecurity: Not on file  Transportation Needs: Not on file  Physical Activity: Not on file  Stress: Not on file  Social Connections: Not on file  Intimate Partner Violence: Not on file    Past Surgical History:  Procedure Laterality Date  . COLONOSCOPY  2013  . POLYPECTOMY    . TRANSANAL ENDOSCOPIC MICROSURGERY  06/30/2011  . WISDOM TOOTH EXTRACTION  2012    Family History  Problem Relation Age of Onset  . Prostate cancer Father   . Alzheimer's disease Father   . Arthritis Other   . Colon cancer Neg Hx     No Known Allergies  Current Outpatient Medications on File Prior to Visit  Medication Sig Dispense Refill  . amLODipine (NORVASC) 5 MG tablet TAKE 1 TABLET(5 MG) BY MOUTH DAILY 90 tablet 1  . atorvastatin (LIPITOR) 10 MG tablet TAKE 1 TABLET(10 MG) BY MOUTH DAILY 30 tablet 5  . Azelastine-Fluticasone 137-50 MCG/ACT SUSP Place 1 spray into the nose in the morning and at bedtime. 23 g 5  . cetirizine (ZYRTEC) 10 MG tablet Take 10 mg by  mouth daily.    . Multiple Vitamin (MULTIVITAMIN) tablet Take 1 tablet by mouth daily as needed.     . valsartan (DIOVAN) 40 MG tablet Take 1 tablet (40 mg total) by mouth daily. Please specify directions, refills and quantity 90 tablet 1   No current facility-administered medications on file prior to visit.    BP 137/69 (BP Location: Right Arm, Patient Position: Sitting, Cuff Size: Small)   Pulse 63   Temp 98.1 F (36.7 C) (Oral)   Resp 16   Ht 5\' 9"  (1.753 m)   Wt 211 lb (95.7 kg)   SpO2 100%   BMI 31.16 kg/m       Objective:   Physical Exam Constitutional:      General: He is not in acute distress.    Appearance: He is well-developed.  HENT:     Head: Normocephalic and atraumatic.  Cardiovascular:     Rate and Rhythm: Normal rate and regular rhythm.     Heart sounds: No murmur heard.   Pulmonary:     Effort: Pulmonary effort is normal. No respiratory distress.     Breath sounds:  Normal breath sounds. No wheezing or rales.  Skin:    General: Skin is warm and dry.          Comments: Face is sunburned  Skin tag noted mid lower back  2 skin keratosis noted @ locations A and B as noted  Neurological:     Mental Status: He is alert and oriented to person, place, and time.  Psychiatric:        Behavior: Behavior normal.        Thought Content: Thought content normal.           Assessment & Plan:  Keratosis- recommended rountine skin check with his dermatologist.   HTN- bp stable. Continue valsartan 40mg  and amlodipine 5mg . Obtain follow up bmet.  Prostate cancer screening- due for PSA.   Allergic rhinitis- currently stable on flonase and zyrtec. If needed, he plans to switch from flonase to dymista.   This visit occurred during the SARS-CoV-2 public health emergency.  Safety protocols were in place, including screening questions prior to the visit, additional usage of staff PPE, and extensive cleaning of exam room while observing appropriate contact time as indicated for disinfecting solutions.

## 2020-04-05 ENCOUNTER — Other Ambulatory Visit: Payer: Self-pay | Admitting: Family

## 2020-04-24 ENCOUNTER — Other Ambulatory Visit: Payer: Self-pay | Admitting: Internal Medicine

## 2020-04-24 ENCOUNTER — Telehealth: Payer: Self-pay | Admitting: Internal Medicine

## 2020-04-24 DIAGNOSIS — G4733 Obstructive sleep apnea (adult) (pediatric): Secondary | ICD-10-CM

## 2020-04-24 NOTE — Telephone Encounter (Signed)
Ok to order CPAP mask of choice, hose and supplies.

## 2020-04-24 NOTE — Telephone Encounter (Signed)
Received message with recs patient from Dr. Annamaria Boots. I tried to call patient to go over recs and there was no answer. Left detailed message with recs and informed about order sent for CPAP supplies in accordance with DPR. Told patient to call with any other questions/concerns.

## 2020-04-24 NOTE — Telephone Encounter (Signed)
Primary Pulmonologist: Dr. Annamaria Boots Last office visit and with whom: 12/26/2019 with Dr. Annamaria Boots What do we see them for (pulmonary problems): OSA, HTN Last OV assessment/plan: see below  Was appointment offered to patient (explain)?     Assessment & Plan Note by Deneise Lever, MD at 02/02/2020 8:44 PM  Author: Deneise Lever, MD Author Type: Physician Filed: 02/02/2020 8:45 PM  Note Status: Written Cosign: Cosign Not Required Encounter Date: 12/26/2019  Problem: Essential hypertension  Editor: Deneise Lever, MD (Physician)             He understands untreated OSA can be associated with increased HTN control issues.       Assessment & Plan Note by Deneise Lever, MD at 02/02/2020 8:42 PM  Author: Deneise Lever, MD Author Type: Physician Filed: 02/02/2020 8:43 PM  Note Status: Written Cosign: Cosign Not Required Encounter Date: 12/26/2019  Problem: Obstructive sleep apnea  Editor: Deneise Lever, MD (Physician)             We need to see if Tawni Millers is able to replace missing machine, depending on age. He clearly benefits with improved sleep and snoring. Consider change to autopap.        Patient Instructions by Deneise Lever, MD at 12/26/2019 10:30 AM  Author: Deneise Lever, MD Author Type: Physician Filed: 12/26/2019 10:55 AM  Note Status: Signed Cosign: Cosign Not Required Encounter Date: 12/26/2019  Editor: Deneise Lever, MD (Physician)             Order- DME Huey Romans    Please replace old/ lost CPAP machine auto 12-18, mask of choice, humidifier, supplies, AirView/ card. Compliance has been excellent.  Mr Erazo, please call if we can help         Reason for call: patient called and him and wife got covid after coming home from [place]. His wife tested positive on 04/21/20 and he tested positive on 04/22/20. He said they have been isolating at home. He was having fever, chills, fatigue at first but that has stopped and just having sinus congestion and  cough. He is taking his allergy meds and OTC Tylenol. He is feeling better.just wanted to let Dr. Annamaria Boots. He is also wanting to re-order CPAP supplies due to having covid. Informed patient that will let Dr. Annamaria Boots know and call back if he has any recs.  Dr. Annamaria Boots, please advise with any recs.  (examples of things to ask: : When did symptoms start? Fever? Cough? Productive? Color to sputum? More sputum than usual? Wheezing? Have you needed increased oxygen? Are you taking your respiratory medications? What over the counter measures have you tried?)  No Known Allergies  Immunization History  Administered Date(s) Administered  . Influenza Whole 11/18/2008  . Influenza, Seasonal, Injecte, Preservative Fre 09/01/2012  . Influenza,inj,Quad PF,6+ Mos 09/17/2014, 09/25/2015, 09/28/2016, 09/29/2017  . Influenza-Unspecified 09/30/2013, 11/07/2019  . Janssen (J&J) SARS-COV-2 Vaccination 03/17/2019  . Moderna Sars-Covid-2 Vaccination 11/07/2019  . Tdap 03/27/2012, 06/30/2017  . Zoster Recombinat (Shingrix) 09/29/2017, 12/05/2017

## 2020-04-24 NOTE — Telephone Encounter (Signed)
Pt stated that he just arrived back from [place] and him and his wife tested positive for Covid-19 (04/22/2020); pt has a runny nose, congested, fever, body aches. Also, pt wanted to get an order placed for CPAP equipment; mask, tubing, and humidifier. Pharmacy; [id number] - [address], [place] . Phone number; [phone]

## 2020-05-07 ENCOUNTER — Other Ambulatory Visit: Payer: Self-pay | Admitting: Family

## 2020-05-18 ENCOUNTER — Telehealth: Payer: Self-pay

## 2020-05-18 NOTE — Telephone Encounter (Signed)
Patient Name: Richard Henderson Gender: Male DOB: 11-Feb-1960 Age: 60 Y 1 M 5 D Return Phone Number: [phone] (Primary) Address: City/ State/ Zip: [place]  [address] Client [name] Night - Client Client Site [facility] - Night Physician Debbrah Alar - NP Contact Type Call Who Is Calling Patient / Member / Family / Caregiver Call Type Triage / Clinical Relationship To Patient Self Return Phone Number [phone] (Primary) Chief Complaint Ear Fullness or Congestion Reason for Call Symptomatic / Request for [place] states he is having ear pressure and pain. Nasal drainage and cough. Translation No

## 2020-05-18 NOTE — Telephone Encounter (Signed)
Called patient to see how he is doing and offer him a virtual visit. He reports he is improving and will call back if not much better in a few days.

## 2020-05-19 ENCOUNTER — Telehealth (INDEPENDENT_AMBULATORY_CARE_PROVIDER_SITE_OTHER): Payer: Managed Care, Other (non HMO) | Admitting: Family

## 2020-05-19 ENCOUNTER — Other Ambulatory Visit: Payer: Self-pay

## 2020-05-19 DIAGNOSIS — H9201 Otalgia, right ear: Secondary | ICD-10-CM

## 2020-05-19 DIAGNOSIS — J019 Acute sinusitis, unspecified: Secondary | ICD-10-CM | POA: Diagnosis not present

## 2020-05-19 MED ORDER — AMOXICILLIN-POT CLAVULANATE 875-125 MG PO TABS: 1.0000 | ORAL_TABLET | Freq: Two times a day (BID) | ORAL | 0 refills | Status: AC

## 2020-05-19 NOTE — Progress Notes (Signed)
Richard Henderson is a 60 y.o. male with the following history as recorded in EpicCare:  Patient Active Problem List   Diagnosis Date Noted  . Eczema 03/20/2015  . Hyperglycemia 02/07/2014  . Knee pain, right 10/23/2013  . Erectile dysfunction 03/27/2012  . Serrated adenoma of rectum s/p TEM resection 27Jun2013 07/18/2011  . Colon cancer screening 02/24/2011  . Obstructive sleep apnea 02/16/2010  . NEVI, MULTIPLE 01/25/2010  . Hyperlipidemia 01/25/2010  . Essential hypertension 01/25/2010  . Allergic rhinitis 01/08/2009  . GERD 01/08/2009  . BACK PAIN, THORACIC REGION, LEFT 01/08/2009    Current Outpatient Medications  Medication Sig Dispense Refill  . amLODipine (NORVASC) 5 MG tablet TAKE 1 TABLET(5 MG) BY MOUTH DAILY 90 tablet 1  . amoxicillin-clavulanate (AUGMENTIN) 875-125 MG tablet Take 1 tablet by mouth 2 (two) times daily for 10 days. 20 tablet 0  . atorvastatin (LIPITOR) 10 MG tablet TAKE 1 TABLET(10 MG) BY MOUTH DAILY 90 tablet 1  . Azelastine-Fluticasone 137-50 MCG/ACT SUSP Place 1 spray into the nose in the morning and at bedtime. 23 g 5  . cetirizine (ZYRTEC) 10 MG tablet Take 10 mg by mouth daily.    . Multiple Vitamin (MULTIVITAMIN) tablet Take 1 tablet by mouth daily as needed.     . valsartan (DIOVAN) 40 MG tablet TAKE 1 TABLET BY MOUTH EVERY DAY 90 tablet 1   No current facility-administered medications for this visit.    Allergies: Patient has no known allergies.  Past Medical History:  Diagnosis Date  . Allergic rhinitis   . Cancer [facility]) 2013   colon cancer  . Colonic mass   . Eczema    on hands  . GERD (gastroesophageal reflux disease)    none since cpap  . Hyperlipidemia   . Hypertension   . Serrated adenoma of rectum s/p TEM resection 27Jun2013 07/18/2011  . Sleep apnea    USES C-PAP    Past Surgical History:  Procedure Laterality Date  . COLONOSCOPY  2013  . POLYPECTOMY    . TRANSANAL ENDOSCOPIC MICROSURGERY  06/30/2011  . WISDOM TOOTH  EXTRACTION  2012    Family History  Problem Relation Age of Onset  . Prostate cancer Father   . Alzheimer's disease Father   . Arthritis Other   . Colon cancer Neg Hx     Social History   Tobacco Use  . Smoking status: Never Smoker  . Smokeless tobacco: Never Used  Substance Use Topics  . Alcohol use: Yes    Alcohol/week: 0.0 standard drinks    Comment: 1    Subjective:   I connected with Richard Henderson on 05/19/20 at  2:40 PM EDT by a telephone call and verified that I am speaking with the correct person using two identifiers. Patient did not have access to a working camera on his computer.    I discussed the limitations of evaluation and management by telemedicine and the availability of in person appointments. The patient expressed understanding and agreed to proceed. Provider in office/ patient is at home; provider and patient are only 2 people on telephone call.   Right ear pain x 1 week; notes that symptoms started with sinus pain/ pressure last week and now feels like majority of symptoms are localized in his right ear; no chest pain or shortness of breath; symptoms seemed to worsen last night;      Objective:  There were no vitals filed for this visit.   Lungs: Respirations unlabored;  Neurologic: Alert  and oriented; speech intact;   Assessment:  1. Acute sinusitis, recurrence not specified, unspecified location   2. Right ear pain     Plan:  Rx for Augmentin 875 mg bid x 10 days; continue Astelin and increase fluids, rest and follow up worse, no better.   Time spent 10 minutes  No follow-ups on file.  No orders of the defined types were placed in this encounter.   Requested Prescriptions   Signed Prescriptions Disp Refills  . amoxicillin-clavulanate (AUGMENTIN) 875-125 MG tablet 20 tablet 0    Sig: Take 1 tablet by mouth 2 (two) times daily for 10 days.

## 2020-08-15 ENCOUNTER — Other Ambulatory Visit: Payer: Self-pay | Admitting: Family

## 2020-09-01 ENCOUNTER — Encounter: Payer: Self-pay | Admitting: Family

## 2020-09-13 ENCOUNTER — Other Ambulatory Visit: Payer: Self-pay | Admitting: Family

## 2020-09-14 ENCOUNTER — Ambulatory Visit (INDEPENDENT_AMBULATORY_CARE_PROVIDER_SITE_OTHER): Payer: Managed Care, Other (non HMO) | Admitting: Family

## 2020-09-14 ENCOUNTER — Encounter: Payer: Self-pay | Admitting: Family

## 2020-09-14 ENCOUNTER — Other Ambulatory Visit: Payer: Self-pay

## 2020-09-14 VITALS — BP 140/70 | HR 60 | Temp 98.6°F | Resp 16 | Wt 206.0 lb

## 2020-09-14 DIAGNOSIS — J302 Other seasonal allergic rhinitis: Secondary | ICD-10-CM

## 2020-09-14 DIAGNOSIS — I10 Essential (primary) hypertension: Secondary | ICD-10-CM

## 2020-09-14 DIAGNOSIS — E785 Hyperlipidemia, unspecified: Secondary | ICD-10-CM

## 2020-09-14 DIAGNOSIS — Z23 Encounter for immunization: Secondary | ICD-10-CM | POA: Diagnosis not present

## 2020-09-14 LAB — BASIC METABOLIC PANEL
BUN: 14 mg/dL (ref 6–23)
CO2: 32 mEq/L (ref 19–32)
Calcium: 9.3 mg/dL (ref 8.4–10.5)
Chloride: 102 mEq/L (ref 96–112)
Creatinine, Ser: 0.96 mg/dL (ref 0.40–1.50)
GFR: 85.96 mL/min (ref >60.00)
Glucose, Bld: 112 mg/dL — ABNORMAL HIGH (ref 70–99)
Potassium: 4.7 mEq/L (ref 3.5–5.1)
Sodium: 139 mEq/L (ref 135–145)

## 2020-09-14 LAB — LIPID PANEL
Cholesterol: 167 mg/dL (ref 0–200)
HDL: 40.4 mg/dL (ref >39.00)
LDL Cholesterol: 93 mg/dL (ref 0–99)
NonHDL: 126.18
Total CHOL/HDL Ratio: 4
Triglycerides: 168 mg/dL — ABNORMAL HIGH (ref 0.0–149.0)
VLDL: 33.6 mg/dL (ref 0.0–40.0)

## 2020-09-14 LAB — HEPATIC FUNCTION PANEL
ALT: 14 U/L (ref 0–53)
AST: 12 U/L (ref 0–37)
Albumin: 4.2 g/dL (ref 3.5–5.2)
Alkaline Phosphatase: 89 U/L (ref 39–117)
Bilirubin, Direct: 0.1 mg/dL (ref 0.0–0.3)
Total Bilirubin: 0.6 mg/dL (ref 0.2–1.2)
Total Protein: 6.6 g/dL (ref 6.0–8.3)

## 2020-09-14 MED ORDER — VALSARTAN 40 MG PO TABS: 40.0000 mg | ORAL_TABLET | Freq: Every day | ORAL | 1 refills | Status: DC

## 2020-09-14 NOTE — Progress Notes (Signed)
Subjective:   By signing my name below, I, Shehryar Baig, attest that this documentation has been prepared under the direction and in the presence of Debbrah Alar NP. 09/14/2020    Patient ID: Richard Henderson, male    DOB: [birth year], 60 y.o.   MRN: [id number]  Chief Complaint  Patient presents with   Hypertension    Here for follow up    HPI Patient is in today for a office visit.  Blood pressure- His blood pressure is slightly elevated but he did not take his blood pressure medication last night. He continues taking 5 mg amlodipine daily PO, 40 mg valsartan daily PO and reports on new issues while taking them. He is requesting a refill on 40 mg valsartan daily PO.   BP Readings from Last 3 Encounters:  09/14/20 140/70  03/13/20 137/69  01/02/20 (!) 145/92   Cholesterol- He continue staking 10 mg Lipitor daily PO and reports no new issues while taking it. He is requesting a refill on it as well.   Lab Results  Component Value Date   CHOL 165 09/13/2019   HDL 38 (L) 09/13/2019   LDLCALC 108 (H) 09/13/2019   TRIG 101 09/13/2019   CHOLHDL 4.3 09/13/2019   Allergies- He continue using 137-50 MCG/ACT Azelastine-Fluticasone nasal spray and 10 mg zyrtec PRN when his symptoms flare up.  Immunizations- He recently received the flu vaccine. He has 3 Covid-19 vaccines at this time. He is interested in getting the new booster vaccine after it releases.   Health Maintenance Due  Topic Date Due   COVID-19 Vaccine (3 - Booster for Janssen series) 03/06/2020   COLONOSCOPY (Pts 45-75yr Insurance coverage will need to be confirmed)  07/08/2020   INFLUENZA VACCINE  08/03/2020    Past Medical History:  Diagnosis Date   Allergic rhinitis    Cancer [facility] 2013   colon cancer   Colonic mass    Eczema    on hands   GERD (gastroesophageal reflux disease)    none since cpap   Hyperlipidemia    Hypertension    Serrated adenoma of rectum s/p TEM resection 27Jun2013 07/18/2011    Sleep apnea    USES C-PAP    Past Surgical History:  Procedure Laterality Date   COLONOSCOPY  2013   POLYPECTOMY     TRANSANAL ENDOSCOPIC MICROSURGERY  06/30/2011   WISDOM TOOTH EXTRACTION  2012    Family History  Problem Relation Age of Onset   Prostate cancer Father    Alzheimer's disease Father    Arthritis Other    Colon cancer Neg Hx     Social History   Socioeconomic History   Marital status: Married    Spouse name: Not on file   Number of children: 3   Years of education: Not on file   Highest education level: Not on file  Occupational History   Occupation: ADispensing optician Tobacco Use   Smoking status: Never   Smokeless tobacco: Never  Vaping Use   Vaping Use: Never used  Substance and Sexual Activity   Alcohol use: Yes    Alcohol/week: 0.0 standard drinks    Comment: 1   Drug use: No   Sexual activity: Not on file  Other Topics Concern   Not on file  Social History Narrative   Married   3 daughters, 2 grandchildren   478dogs   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  Social Determinants of Health   Financial Resource Strain: Not on file  Food Insecurity: Not on file  Transportation Needs: Not on file  Physical Activity: Not on file  Stress: Not on file  Social Connections: Not on file  Intimate Partner Violence: Not on file    Outpatient Medications Prior to Visit  Medication Sig Dispense Refill   amLODipine (NORVASC) 5 MG tablet TAKE 1 TABLET(5 MG) BY MOUTH DAILY 90 tablet 1   Azelastine-Fluticasone 137-50 MCG/ACT SUSP Place 1 spray into the nose in the morning and at bedtime. 23 g 5   cetirizine (ZYRTEC) 10 MG tablet Take 10 mg by mouth daily.     Multiple Vitamin (MULTIVITAMIN) tablet Take 1 tablet by mouth daily as needed.      atorvastatin (LIPITOR) 10 MG tablet TAKE 1 TABLET(10 MG) BY MOUTH DAILY 90 tablet 1   valsartan (DIOVAN) 40 MG tablet TAKE 1 TABLET BY MOUTH EVERY DAY 90 tablet 1   No  facility-administered medications prior to visit.    No Known Allergies  ROS     Objective:    Physical Exam Constitutional:      General: He is not in acute distress.    Appearance: Normal appearance. He is not ill-appearing.  HENT:     Head: Normocephalic and atraumatic.     Right Ear: External ear normal.     Left Ear: External ear normal.  Eyes:     Extraocular Movements: Extraocular movements intact.     Pupils: Pupils are equal, round, and reactive to light.  Cardiovascular:     Rate and Rhythm: Normal rate and regular rhythm.     Heart sounds: Normal heart sounds. No murmur heard.   No gallop.  Pulmonary:     Effort: Pulmonary effort is normal. No respiratory distress.     Breath sounds: Normal breath sounds. No wheezing or rales.  Skin:    General: Skin is warm and dry.  Neurological:     Mental Status: He is alert and oriented to person, place, and time.  Psychiatric:        Behavior: Behavior normal.    BP 140/70 (BP Location: Right Arm, Patient Position: Sitting, Cuff Size: Large)   Pulse 60   Temp 98.6 F (37 C) (Oral)   Resp 16   Wt 206 lb (93.4 kg)   SpO2 100%   BMI 30.42 kg/m  Wt Readings from Last 3 Encounters:  09/14/20 206 lb (93.4 kg)  03/13/20 211 lb (95.7 kg)  01/02/20 210 lb (95.3 kg)       Assessment & Plan:   Problem List Items Addressed This Visit       Unprioritized   Hyperlipidemia    Lab Results  Component Value Date   CHOL 165 09/13/2019   HDL 38 (L) 09/13/2019   LDLCALC 108 (H) 09/13/2019   TRIG 101 09/13/2019   CHOLHDL 4.3 09/13/2019  Tolerating atorvastatin.  Check follow up lipid panel.       Relevant Medications   valsartan (DIOVAN) 40 MG tablet   Other Relevant Orders   Lipid panel   Hepatic function panel   Essential hypertension - Primary    BP Readings from Last 3 Encounters:  09/14/20 140/70  03/13/20 137/69  01/02/20 (!) 145/92  Fair control on diovan '40mg'$  and amlodipine '5mg'$ . Check follow up bmet.        Relevant Medications   valsartan (DIOVAN) 40 MG tablet   Other Relevant Orders   Basic  metabolic panel   Allergic rhinitis    Stable.  Not needing Azelastine-fluticasone or zyrtec regularly.         Meds ordered this encounter  Medications   valsartan (DIOVAN) 40 MG tablet    Sig: Take 1 tablet (40 mg total) by mouth daily.    Dispense:  90 tablet    Refill:  1    Order Specific Question:   Supervising Provider    Answer:   Penni Homans A [[id number]]    I, Debbrah Alar NP, personally preformed the services described in this documentation.  All medical record entries made by the scribe were at my direction and in my presence.  I have reviewed the chart and discharge instructions (if applicable) and agree that the record reflects my personal performance and is accurate and complete. 09/14/2020   I,Shehryar Baig,acting as a Education administrator for Nance Pear, NP.,have documented all relevant documentation on the behalf of Nance Pear, NP,as directed by  Nance Pear, NP while in the presence of Nance Pear, NP.   Nance Pear, NP

## 2020-09-14 NOTE — Assessment & Plan Note (Addendum)
Lab Results  Component Value Date   CHOL 165 09/13/2019   HDL 38 (L) 09/13/2019   LDLCALC 108 (H) 09/13/2019   TRIG 101 09/13/2019   CHOLHDL 4.3 09/13/2019   Tolerating atorvastatin.  Check follow up lipid panel.

## 2020-09-14 NOTE — Assessment & Plan Note (Addendum)
BP Readings from Last 3 Encounters:  09/14/20 140/70  03/13/20 137/69  01/02/20 (!) 145/92   Fair control on diovan '40mg'$  and amlodipine '5mg'$ . Check follow up bmet.

## 2020-09-14 NOTE — Assessment & Plan Note (Signed)
Stable.  Not needing Azelastine-fluticasone or zyrtec regularly.

## 2020-09-14 NOTE — Patient Instructions (Signed)
Please complete lab work prior to leaving.   

## 2020-09-14 NOTE — Addendum Note (Signed)
Addended by: Jiles Prows on: 09/14/2020 08:52 AM   Modules accepted: Orders

## 2020-09-16 ENCOUNTER — Ambulatory Visit (INDEPENDENT_AMBULATORY_CARE_PROVIDER_SITE_OTHER): Payer: Managed Care, Other (non HMO) | Admitting: Dermatology

## 2020-09-16 ENCOUNTER — Encounter: Payer: Self-pay | Admitting: Dermatology

## 2020-09-16 ENCOUNTER — Other Ambulatory Visit: Payer: Self-pay

## 2020-09-16 DIAGNOSIS — D239 Other benign neoplasm of skin, unspecified: Secondary | ICD-10-CM

## 2020-09-16 DIAGNOSIS — D1801 Hemangioma of skin and subcutaneous tissue: Secondary | ICD-10-CM

## 2020-09-16 DIAGNOSIS — D3610 Benign neoplasm of peripheral nerves and autonomic nervous system, unspecified: Secondary | ICD-10-CM

## 2020-09-16 DIAGNOSIS — D3611 Benign neoplasm of peripheral nerves and autonomic nervous system of face, head, and neck: Secondary | ICD-10-CM

## 2020-09-16 DIAGNOSIS — Z1283 Encounter for screening for malignant neoplasm of skin: Secondary | ICD-10-CM | POA: Diagnosis not present

## 2020-09-16 DIAGNOSIS — D2371 Other benign neoplasm of skin of right lower limb, including hip: Secondary | ICD-10-CM

## 2020-09-16 DIAGNOSIS — L918 Other hypertrophic disorders of the skin: Secondary | ICD-10-CM

## 2020-09-16 DIAGNOSIS — L821 Other seborrheic keratosis: Secondary | ICD-10-CM | POA: Diagnosis not present

## 2020-09-26 ENCOUNTER — Encounter: Payer: Self-pay | Admitting: Dermatology

## 2020-09-26 NOTE — Progress Notes (Signed)
   New Patient   Subjective  Richard Henderson is a 60 y.o. male who presents for the following: Annual Exam (Right leg- seems different).  General skin examination with several areas of concern. Location:  Duration:  Quality:  Associated Signs/Symptoms: Modifying Factors:  Severity:  Timing: Context:    The following portions of the chart were reviewed this encounter and updated as appropriate:  Tobacco  Allergies  Meds  Problems  Med Hx  Surg Hx  Fam Hx      Objective  Well appearing patient in no apparent distress; mood and affect are within normal limits. Mid Back Full body skin exam.  No atypical pigmented lesions or nonmelanoma skin cancer.  Left Lower Leg - Posterior, Mid Back, Right Lower Leg - Posterior, Right Upper Arm - Anterior Multiple 2 to 21mm brown textured flattopped papules; dermoscopy confirms  Neck - Posterior Soft partially compressible 6 mm pink dermal papule  Chest - Medial (Center), Scalp Multiple 1 to 3 mm smooth red dermal papules  Right Axilla Fleshy, skin-colored to millimeter pedunculated papule  Right Lower Leg - Anterior 5 mm firm dermal pink papule; dermoscopy typical    A full examination was performed including scalp, head, eyes, ears, nose, lips, neck, chest, axillae, abdomen, back, buttocks, bilateral upper extremities, bilateral lower extremities, hands, feet, fingers, toes, fingernails, and toenails. All findings within normal limits unless otherwise noted below.   Assessment & Plan  Screening for malignant neoplasm of skin Mid Back  Yearly skin exams.  Encouraged to self examine twice annually with spouse.  Continued ultraviolet protection.  Seborrheic keratosis (4) Right Upper Arm - Anterior; Left Lower Leg - Posterior; Right Lower Leg - Posterior; Mid Back  Benign no treatment needed if stable  Neurofibroma Neck - Posterior  Leave if stable  Hemangioma of skin (2) Chest - Medial (Center); Scalp  No  intervention necessary  Skin tag Right Axilla  May choose elective removal in future.  Dermatofibroma Right Lower Leg - Anterior  Discussed both confirmatory shave biopsy or complete excision, but for now patient chooses to leave lesion unless there is clinical change.

## 2020-11-09 ENCOUNTER — Other Ambulatory Visit: Payer: Self-pay

## 2020-11-09 ENCOUNTER — Encounter: Payer: Self-pay | Admitting: Internal Medicine

## 2020-11-09 ENCOUNTER — Ambulatory Visit (AMBULATORY_SURGERY_CENTER): Payer: Managed Care, Other (non HMO) | Admitting: *Deleted

## 2020-11-09 VITALS — Ht 69.0 in | Wt 205.0 lb

## 2020-11-09 DIAGNOSIS — Z86010 Personal history of colonic polyps: Secondary | ICD-10-CM

## 2020-11-09 MED ORDER — NA SULFATE-K SULFATE-MG SULF 17.5-3.13-1.6 GM/177ML PO SOLN: 1.0000 | Freq: Once | ORAL | 0 refills | Status: AC

## 2020-11-09 NOTE — Progress Notes (Signed)
No egg or soy allergy known to patient  No issues known to pt with past sedation with any surgeries or procedures Patient denies ever being told they had issues or difficulty with intubation  No FH of Malignant Hyperthermia Pt is not on diet pills Pt is not on  home 02  Pt is not on blood thinners  Pt denies issues with constipation  No A fib or A flutter  Pt is fully vaccinated  for Covid   NO PA's for preps discussed with pt In PV today  Discussed with pt there will be an out-of-pocket cost for prep and that varies from $0 to 70 +  dollars - pt verbalized understanding   Due to the COVID-19 pandemic we are asking patients to follow certain guidelines in PV and the [facility]   Pt aware of COVID protocols and [facility] guidelines   PV completed over the phone. Pt verified name, DOB, address and insurance during PV today.  Pt mailed instruction packet with copy of consent form to read and not return, and instructions Pt encouraged to call with questions or issues.  If pt has My chart, procedure instructions sent via My Chart

## 2020-11-16 ENCOUNTER — Telehealth: Payer: Self-pay | Admitting: Internal Medicine

## 2020-11-16 NOTE — Telephone Encounter (Signed)
Tested + today- SIL tested and is Positive-  Two grandkids sick but not tested - pt has been vax'd with J and J and then Moderna booster  Pt has a sore throat only- has not tested as of now - procedure is Monday 11-21-    Please advise-   Thanks Lelan Pons PV

## 2020-11-16 NOTE — Telephone Encounter (Signed)
Patient called requested a call back from a nurse said someone in his household tested positive for Covid 19 today seeking advise is scheduled for 11/23/20.

## 2020-11-16 NOTE — Telephone Encounter (Signed)
Clarification --Son in Sports coach tested + for Covid- today pt lives with him and  his daughter  Thanks

## 2020-11-17 NOTE — Telephone Encounter (Signed)
If pt wishes to keep this appt for procedure he should able to if: 5 days have passed from a positive test and he is fever-free with residual symptoms improving Totally asymptomatic with a negative test or without a positive test If he continues to have any symptoms given definitive exposure I would recommend OTC COVID-19 Ag testing  Hope that helps [facility]

## 2020-11-17 NOTE — Telephone Encounter (Signed)
Called pt- he has no other symptoms today  -he will test  today , if negative, he will retest Saturday the 10 th - if he is negative still, will proceed with [address] colon. If positive, he will call the On call number and notify MD - if he is positive today, he will call as well   Lelan Pons PV

## 2020-11-23 ENCOUNTER — Encounter: Payer: Self-pay | Admitting: Internal Medicine

## 2020-11-23 ENCOUNTER — Ambulatory Visit (AMBULATORY_SURGERY_CENTER): Payer: Managed Care, Other (non HMO) | Admitting: Internal Medicine

## 2020-11-23 ENCOUNTER — Other Ambulatory Visit: Payer: Self-pay | Admitting: Internal Medicine

## 2020-11-23 VITALS — BP 120/73 | HR 67 | Temp 98.2°F | Resp 9 | Ht 69.0 in | Wt 205.0 lb

## 2020-11-23 DIAGNOSIS — Z86010 Personal history of colonic polyps: Secondary | ICD-10-CM | POA: Diagnosis present

## 2020-11-23 DIAGNOSIS — D122 Benign neoplasm of ascending colon: Secondary | ICD-10-CM

## 2020-11-23 MED ORDER — SODIUM CHLORIDE 0.9 % IV SOLN: 500.0000 mL | Freq: Once | INTRAVENOUS | Status: DC

## 2020-11-23 NOTE — Progress Notes (Signed)
GASTROENTEROLOGY PROCEDURE H&P NOTE   Primary Care Physician: Debbrah Alar, NP    Reason for Procedure:  Personal history of colon polyps  Plan:    Surveillance colonoscopy  Patient is appropriate for endoscopic procedure(s) in the ambulatory ([facility]) setting.  The nature of the procedure, as well as the risks, benefits, and alternatives were carefully and thoroughly reviewed with the patient. Ample time for discussion and questions allowed. The patient understood, was satisfied, and agreed to proceed.     HPI: Richard Henderson is a 60 y.o. male who presents for surveillance colonoscopy.  History of traditional serrated adenoma of the rectum requiring partial proctectomy by TEM.  Other medical history as below.  Tolerated the prep.  No recent issues with chest pain or shortness of breath.  No abdominal pain.  Past Medical History:  Diagnosis Date   Allergic rhinitis    Allergy    Cancer ([facility]) 2013   noncancerous- TA polyp per pt   Colonic mass    Eczema    on hands   GERD (gastroesophageal reflux disease)    none since cpap   Hyperlipidemia    Hypertension    Serrated adenoma of rectum s/p TEM resection 27Jun2013 07/18/2011   Sleep apnea    USES C-PAP    Past Surgical History:  Procedure Laterality Date   COLONOSCOPY  2013   POLYPECTOMY     TRANSANAL ENDOSCOPIC MICROSURGERY  06/30/2011   WISDOM TOOTH EXTRACTION  2012    Prior to Admission medications   Medication Sig Start Date End Date Taking? Authorizing Provider  amLODipine (NORVASC) 5 MG tablet TAKE 1 TABLET(5 MG) BY MOUTH DAILY 08/15/20  Yes Debbrah Alar, NP  atorvastatin (LIPITOR) 10 MG tablet TAKE 1 TABLET(10 MG) BY MOUTH DAILY 09/14/20  Yes Debbrah Alar, NP  valsartan (DIOVAN) 40 MG tablet Take 1 tablet (40 mg total) by mouth daily. 09/14/20  Yes Debbrah Alar, NP  augmented betamethasone dipropionate (DIPROLENE-AF) 0.05 % ointment Apply topically 2 (two) times daily.     [provider]  Azelastine-Fluticasone 137-50 MCG/ACT SUSP Place 1 spray into the nose in the morning and at bedtime. Patient not taking: Reported on 11/09/2020 06/04/19   Debbrah Alar, NP  cetirizine (ZYRTEC) 10 MG tablet Take 10 mg by mouth as needed.    [provider]  Multiple Vitamin (MULTIVITAMIN) tablet Take 1 tablet by mouth daily as needed.     [provider]    Current Outpatient Medications  Medication Sig Dispense Refill   amLODipine (NORVASC) 5 MG tablet TAKE 1 TABLET(5 MG) BY MOUTH DAILY 90 tablet 1   atorvastatin (LIPITOR) 10 MG tablet TAKE 1 TABLET(10 MG) BY MOUTH DAILY 90 tablet 1   valsartan (DIOVAN) 40 MG tablet Take 1 tablet (40 mg total) by mouth daily. 90 tablet 1   augmented betamethasone dipropionate (DIPROLENE-AF) 0.05 % ointment Apply topically 2 (two) times daily.     Azelastine-Fluticasone 137-50 MCG/ACT SUSP Place 1 spray into the nose in the morning and at bedtime. (Patient not taking: Reported on 11/09/2020) 23 g 5   cetirizine (ZYRTEC) 10 MG tablet Take 10 mg by mouth as needed.     Multiple Vitamin (MULTIVITAMIN) tablet Take 1 tablet by mouth daily as needed.      Current Facility-Administered Medications  Medication Dose Route Frequency Provider Last Rate Last Admin   0.9 %  sodium chloride infusion  500 mL Intravenous Once Amman Bartel, Lajuan Lines, MD  Allergies as of 11/23/2020   (No Known Allergies)    Family History  Problem Relation Age of Onset   Prostate cancer Father    Alzheimer's disease Father    Arthritis Other    Colon cancer Neg Hx    Colon polyps Neg Hx    Esophageal cancer Neg Hx    Rectal cancer Neg Hx    Stomach cancer Neg Hx     Social History   Socioeconomic History   Marital status: Married    Spouse name: Not on file   Number of children: 3   Years of education: Not on file   Highest education level: Not on file  Occupational History   Occupation: Dispensing optician  Tobacco  Use   Smoking status: Never   Smokeless tobacco: Never  Vaping Use   Vaping Use: Never used  Substance and Sexual Activity   Alcohol use: Yes    Comment: OCC- very OCC   Drug use: No   Sexual activity: Not on file  Other Topics Concern   Not on file  Social History Narrative   Married   3 daughters, 2 grandchildren   4 dogs   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   Social Determinants of Health   Financial Resource Strain: Not on file  Food Insecurity: Not on file  Transportation Needs: Not on file  Physical Activity: Not on file  Stress: Not on file  Social Connections: Not on file  Intimate Partner Violence: Not on file    Physical Exam: Vital signs in last 24 hours: @BP  130/60   Pulse 76   Temp 98.2 F (36.8 C)   Ht 5\' 9"  (1.753 m)   Wt 205 lb (93 kg)   SpO2 98%   BMI 30.27 kg/m  GEN: NAD EYE: Sclerae anicteric ENT: MMM CV: Non-tachycardic Pulm: CTA b/l GI: Soft, NT/ND NEURO:  Alert & Oriented x 3   Zenovia Jarred, MD [facility]  11/23/2020 8:56 AM

## 2020-11-23 NOTE — Op Note (Signed)
[place] [facility] Patient Name: Richard Henderson Procedure Date: 11/23/2020 8:57 AM MRN: [id number] Endoscopist: Jerene Bears , MD Age: 60 Referring MD:  Date of Birth: [birth year] Gender: Male Account #: [id number] Procedure:                Colonoscopy Indications:              High risk colon cancer surveillance: Personal                            history of traditional serrated adenoma of the                            colon requiring partial proctectomy TEM 2013, Last                            colonoscopy: July 2017 Medicines:                Monitored Anesthesia Care Procedure:                Pre-Anesthesia Assessment:                           - Prior to the procedure, a History and Physical                            was performed, and patient medications and                            allergies were reviewed. The patient's tolerance of                            previous anesthesia was also reviewed. The risks                            and benefits of the procedure and the sedation                            options and risks were discussed with the patient.                            All questions were answered, and informed consent                            was obtained. Prior Anticoagulants: The patient has                            taken no previous anticoagulant or antiplatelet                            agents. ASA Grade Assessment: II - A patient with                            mild systemic disease. After reviewing the risks  and benefits, the patient was deemed in                            satisfactory condition to undergo the procedure.                           After obtaining informed consent, the colonoscope                            was passed under direct vision. Throughout the                            procedure, the patient's blood pressure, pulse, and                            oxygen saturations were monitored continuously.  The                            CF [id number] #[id number] was introduced through the anus                            and advanced to the cecum, identified by                            appendiceal orifice and ileocecal valve. The                            colonoscopy was performed without difficulty. The                            patient tolerated the procedure well. The quality                            of the bowel preparation was excellent. The                            ileocecal valve, appendiceal orifice, and rectum                            were photographed. Scope In: 9:05:53 AM Scope Out: 9:17:53 AM Scope Withdrawal Time: 0 hours 10 minutes 38 seconds  Total Procedure Duration: 0 hours 12 minutes 0 seconds  Findings:                 The digital rectal exam was normal.                           A 5 mm polyp was found in the ascending colon. The                            polyp was sessile. The polyp was removed with a                            cold snare. Resection and retrieval were complete.  There was evidence of a prior surgical intervention                            in the distal rectum. This was characterized by                            healthy appearing mucosa with no evidence for                            recurrent polyp.                           No additional abnormalities were found on                            retroflexion. Complications:            No immediate complications. Estimated Blood Loss:     Estimated blood loss was minimal. Impression:               - One 5 mm polyp in the ascending colon, removed                            with a cold snare. Resected and retrieved.                           - Evidence of previous surgical intervention in                            distal rectum characterized by healthy appearing                            mucosa. Recommendation:           - Patient has a contact number available for                             emergencies. The signs and symptoms of potential                            delayed complications were discussed with the                            patient. Return to normal activities tomorrow.                            Written discharge instructions were provided to the                            patient.                           - Resume previous diet.                           - Continue present medications.                           -  Await pathology results.                           - Repeat colonoscopy in 5 years for surveillance. Jerene Bears, MD 11/23/2020 9:22:41 AM This report has been signed electronically.

## 2020-11-23 NOTE — Patient Instructions (Signed)
YOU HAD AN ENDOSCOPIC PROCEDURE TODAY AT THE [facility]:   Refer to the procedure report that was given to you for any specific questions about what was found during the examination.  If the procedure report does not answer your questions, please call your gastroenterologist to clarify.  If you requested that your care partner not be given the details of your procedure findings, then the procedure report has been included in a sealed envelope for you to review at your convenience later.  YOU SHOULD EXPECT: Some feelings of bloating in the abdomen. Passage of more gas than usual.  Walking can help get rid of the air that was put into your GI tract during the procedure and reduce the bloating. If you had a lower endoscopy (such as a colonoscopy or flexible sigmoidoscopy) you may notice spotting of blood in your stool or on the toilet paper. If you underwent a bowel prep for your procedure, you may not have a normal bowel movement for a few days.  Please Note:  You might notice some irritation and congestion in your nose or some drainage.  This is from the oxygen used during your procedure.  There is no need for concern and it should clear up in a day or so.  SYMPTOMS TO REPORT IMMEDIATELY:  Following lower endoscopy (colonoscopy or flexible sigmoidoscopy):  Excessive amounts of blood in the stool  Significant tenderness or worsening of abdominal pains  Swelling of the abdomen that is new, acute  Fever of 100F or higher    For urgent or emergent issues, a gastroenterologist can be reached at any hour by calling [phone]. Do not use MyChart messaging for urgent concerns.    DIET:  We do recommend a small meal at first, but then you may proceed to your regular diet.  Drink plenty of fluids but you should avoid alcoholic beverages for 24 hours.  ACTIVITY:  You should plan to take it easy for the rest of today and you should NOT DRIVE or use heavy machinery until tomorrow (because  of the sedation medicines used during the test).    FOLLOW UP: Our staff will call the number listed on your records 48-72 hours following your procedure to check on you and address any questions or concerns that you may have regarding the information given to you following your procedure. If we do not reach you, we will leave a message.  We will attempt to reach you two times.  During this call, we will ask if you have developed any symptoms of COVID 19. If you develop any symptoms (ie: fever, flu-like symptoms, shortness of breath, cough etc.) before then, please call [phone].  If you test positive for Covid 19 in the 2 weeks post procedure, please call and report this information to us.    If any biopsies were taken you will be contacted by phone or by letter within the next 1-3 weeks.  Please call us at [phone] if you have not heard about the biopsies in 3 weeks.    SIGNATURES/CONFIDENTIALITY: You and/or your care partner have signed paperwork which will be entered into your electronic medical record.  These signatures attest to the fact that that the information above on your After Visit Summary has been reviewed and is understood.  Full responsibility of the confidentiality of this discharge information lies with you and/or your care-partner.    Resume medications. Information given on polyps. 

## 2020-11-23 NOTE — Progress Notes (Signed)
Pt's states no medical or surgical changes since previsit or office visit. 

## 2020-11-23 NOTE — Progress Notes (Signed)
Report given to PACU, vss 

## 2020-11-23 NOTE — Progress Notes (Signed)
Called to room to assist during endoscopic procedure.  Patient ID and intended procedure confirmed with present staff. Received instructions for my participation in the procedure from the performing physician.  

## 2020-11-25 ENCOUNTER — Telehealth: Payer: Self-pay

## 2020-11-25 NOTE — Telephone Encounter (Signed)
  Follow up Call-  Call back number 11/23/2020  Post procedure Call Back phone  # [phone]  Permission to leave phone message Yes  Some recent data might be hidden     Patient questions:  Do you have a fever, pain , or abdominal swelling? No. Pain Score  0 *  Have you tolerated food without any problems? Yes.    Have you been able to return to your normal activities? Yes.    Do you have any questions about your discharge instructions: Diet   No. Medications  No. Follow up visit  No.  Do you have questions or concerns about your Care? No.  Actions: * If pain score is 4 or above: No action needed, pain <4.  Have you developed a fever since your procedure? no  2.   Have you had an respiratory symptoms (SOB or cough) since your procedure? no  3.   Have you tested positive for COVID 19 since your procedure no  4.   Have you had any family members/close contacts diagnosed with the COVID 19 since your procedure?  no   If yes to any of these questions please route to Joylene John, RN and Joella Prince, RN

## 2020-11-30 ENCOUNTER — Encounter: Payer: Self-pay | Admitting: Internal Medicine

## 2020-12-25 ENCOUNTER — Telehealth: Payer: Self-pay | Admitting: Internal Medicine

## 2020-12-25 MED ORDER — METRONIDAZOLE 250 MG PO TABS: 250.0000 mg | ORAL_TABLET | Freq: Three times a day (TID) | ORAL | 0 refills | Status: DC

## 2020-12-25 MED ORDER — CIPROFLOXACIN HCL 500 MG PO TABS: 500.0000 mg | ORAL_TABLET | Freq: Two times a day (BID) | ORAL | 0 refills | Status: DC

## 2020-12-25 NOTE — Telephone Encounter (Signed)
Pt called and states that for the past few weeks he has been having pain in his LLQ. States the pain wakes him up at night and in the am when he tries to get out of bed the pain is bad and he has to move slow. Reports that during the day the pain goes away. States last night when he layed down on the couch he had the discomfort in the LLQ. Reports the pain as severe. He is still passing a lot of gas. Please advise.

## 2020-12-25 NOTE — Telephone Encounter (Signed)
I had noted a few sigmoid diverticula at colonoscopy in 2017, though I did not specifically note this at recent colonoscopy Pain suspicious for possible diverticulitis I would recommend that we start Cipro 500 mg twice daily and metronidazole 250 mg 3 times daily both for 7 days If he gets worse over the weekend and he needs to go to the ER for imaging

## 2020-12-25 NOTE — Telephone Encounter (Signed)
Patient called.  For the last three weeks (following his colonoscopy) patient complains of lower left abdominal pain that is fairly severe and wakes him up about every morning.  As the day goes on, it seems to subside, but he just wants to talk to someone to see if this is normal or what he can do about it.  Please call.  Thank you.

## 2020-12-25 NOTE — Telephone Encounter (Signed)
Spoke with pt and he is aware of Dr. Vena Rua recommendations. Scripts sent to pharmacy.

## 2021-01-06 ENCOUNTER — Other Ambulatory Visit: Payer: Self-pay

## 2021-01-06 ENCOUNTER — Other Ambulatory Visit: Payer: Self-pay | Admitting: Internal Medicine

## 2021-01-06 ENCOUNTER — Telehealth: Payer: Self-pay | Admitting: Internal Medicine

## 2021-01-06 DIAGNOSIS — R1032 Left lower quadrant pain: Secondary | ICD-10-CM

## 2021-01-06 NOTE — Telephone Encounter (Signed)
An option would be: [facility] [address] [place] [address] [place],  [address] Get Directions Appointments [phone] Office [phone] Fax [phone]

## 2021-01-06 NOTE — Telephone Encounter (Signed)
Returned pt's call. Pt states it has been 6 days since he stopped the antibiotics and the pain has returned. Pt reports mild abd pain in the LLQ at about a 2 or 3/10. Pt states pain is increased when he first wakes up but then usually eases off within 2 hours of waking. Pt reports having 2-3 soft stools per day. Asked pt if he had a fever and pt states he doesn't think he does but he woke up drenched in sweat twice over the past two days. Pt wanted to know doctor's recommendations and what next steps would be. Please advise.

## 2021-01-06 NOTE — Telephone Encounter (Signed)
At this point given treatment for diverticulitis and persistent, though milder symptoms, I would recommend the following CBC, CMP CT scan abdomen pelvis with contrast --evaluate lower and left lower quadrant abdominal pain and rule out diverticulitis

## 2021-01-06 NOTE — Telephone Encounter (Signed)
Noted. MyChart message sent

## 2021-01-06 NOTE — Telephone Encounter (Signed)
Spoke with pt about doctor's recommendations. Pt stated that he recently moved to [place],  [place] and wants lab work and ct scan to be completed there. Pt also wanted to know if Dr. Hilarie Fredrickson had any recommendations about a gastroenterologist near [place] that pt could see.   Order for CBC and CMP faxed to [facility] on [address] in [place]. Fax number is [phone]. Pt wanted CT scan done at [place]. Order for CT faxed to [phone]. Message sent to [facility] for prior authorization for CT scan.

## 2021-01-07 LAB — COMPREHENSIVE METABOLIC PANEL
ALT: 25 IU/L (ref 0–44)
AST: 17 IU/L (ref 0–40)
Albumin/Globulin Ratio: 2.3 — ABNORMAL HIGH (ref 1.2–2.2)
Albumin: 4.3 g/dL (ref 3.8–4.9)
Alkaline Phosphatase: 90 IU/L (ref 44–121)
BUN/Creatinine Ratio: 17 (ref 10–24)
BUN: 14 mg/dL (ref 8–27)
Bilirubin Total: 0.4 mg/dL (ref 0.0–1.2)
CO2: 26 mmol/L (ref 20–29)
Calcium: 9 mg/dL (ref 8.6–10.2)
Chloride: 99 mmol/L (ref 96–106)
Creatinine, Ser: 0.84 mg/dL (ref 0.76–1.27)
Globulin, Total: 1.9 g/dL (ref 1.5–4.5)
Glucose: 122 mg/dL — ABNORMAL HIGH (ref 70–99)
Potassium: 4.2 mmol/L (ref 3.5–5.2)
Sodium: 140 mmol/L (ref 134–144)
Total Protein: 6.2 g/dL (ref 6.0–8.5)
eGFR: 100 mL/min/{1.73_m2} (ref >59)

## 2021-01-07 LAB — CBC WITH DIFFERENTIAL/PLATELET
Basophils Absolute: 0 10*3/uL (ref 0.0–0.2)
Basos: 1 %
EOS (ABSOLUTE): 0.1 10*3/uL (ref 0.0–0.4)
Eos: 1 %
Hematocrit: 47.7 % (ref 37.5–51.0)
Hemoglobin: 16 g/dL (ref 13.0–17.7)
Immature Grans (Abs): 0 10*3/uL (ref 0.0–0.1)
Immature Granulocytes: 0 %
Lymphocytes Absolute: 1.5 10*3/uL (ref 0.7–3.1)
Lymphs: 25 %
MCH: 29.5 pg (ref 26.6–33.0)
MCHC: 33.5 g/dL (ref 31.5–35.7)
MCV: 88 fL (ref 79–97)
Monocytes Absolute: 0.5 10*3/uL (ref 0.1–0.9)
Monocytes: 9 %
Neutrophils Absolute: 3.8 10*3/uL (ref 1.4–7.0)
Neutrophils: 64 %
Platelets: 227 10*3/uL (ref 150–450)
RBC: 5.42 x10E6/uL (ref 4.14–5.80)
RDW: 12.8 % (ref 11.6–15.4)
WBC: 5.9 10*3/uL (ref 3.4–10.8)

## 2021-01-08 ENCOUNTER — Other Ambulatory Visit: Payer: Self-pay | Admitting: Family

## 2021-01-08 NOTE — Telephone Encounter (Signed)
CT [facility] scheduled at [facility] [place] for 01/13/21 at 9:45. Pt will need to arrive at 8:30 am. Spoke with pt. Pt verbalized understanding and had no other concerns at end of call.

## 2021-01-21 NOTE — Telephone Encounter (Signed)
[facility] at [phone] and asked them to fax copy of most recent CT scan to [facility] attention to me.

## 2021-01-21 NOTE — Telephone Encounter (Signed)
I had previously seen this report and commented on it yesterday It was likely collected by Elmyra Ricks or one of the CMAs that I was working with in clinic Please see that document for my interpretation, I also wrote on that document to have the patient notified

## 2021-01-21 NOTE — Telephone Encounter (Signed)
I have spoken to patient to advise that Dr Hilarie Fredrickson reviewed his CT scan and indicated the following:  "01/20/21- CT scan done for abdominal pain is normal. He was treated for diverticulitis recently. He has moved to [place], [place] area and communicated he will look for local GI. I recommended [facility]. He can and should follow up here or establish GI closer to home if persistent symptoms. -JMP"  Patient verbalizes understanding of this information and states he will reach out to [employer]. I have again given him the contact information to their office.

## 2021-01-21 NOTE — Telephone Encounter (Signed)
CT report from [facility] received. Placed in your IN box for review. Thanks

## 2021-02-08 ENCOUNTER — Telehealth: Payer: Self-pay | Admitting: Family

## 2021-02-08 NOTE — Telephone Encounter (Signed)
Patient would like for someone to contact him regarding lower left abdominal pain.  Patient states that it happens every morning than tends to ease off.

## 2021-03-19 ENCOUNTER — Ambulatory Visit: Payer: Managed Care, Other (non HMO) | Admitting: Family

## 2021-09-20 ENCOUNTER — Ambulatory Visit: Payer: Managed Care, Other (non HMO) | Admitting: Dermatology

## 2021-10-05 DIAGNOSIS — K5790 Diverticulosis of intestine, part unspecified, without perforation or abscess without bleeding: Secondary | ICD-10-CM | POA: Insufficient documentation

## 2021-11-09 LAB — LAB REPORT - SCANNED: EGFR: 97

## 2022-08-12 NOTE — Telephone Encounter (Signed)
Pcp removed

## 2022-10-31 DIAGNOSIS — G4733 Obstructive sleep apnea (adult) (pediatric): Secondary | ICD-10-CM | POA: Diagnosis not present

## 2022-12-05 ENCOUNTER — Ambulatory Visit
Admission: EM | Admit: 2022-12-05 | Discharge: 2022-12-05 | Disposition: A | Discharge status: Home / Self Care | Payer: BC Managed Care – PPO | Attending: Physician Assistant | Admitting: Physician Assistant

## 2022-12-05 DIAGNOSIS — S71111A Laceration without foreign body, right thigh, initial encounter: Secondary | ICD-10-CM

## 2022-12-05 MED ORDER — AMOXICILLIN-POT CLAVULANATE 875-125 MG PO TABS: 1.0000 | ORAL_TABLET | Freq: Two times a day (BID) | ORAL | 0 refills | Status: AC

## 2022-12-05 MED ORDER — TETANUS-DIPHTH-ACELL PERTUSSIS 5-2.5-18.5 LF-MCG/0.5 IM SUSY
0.5000 mL | PREFILLED_SYRINGE | Freq: Once | INTRAMUSCULAR | Status: AC
Start: 2022-12-05 — End: 2022-12-05
  Administered 2022-12-05: 0.5 mL via INTRAMUSCULAR

## 2022-12-05 NOTE — ED Provider Notes (Signed)
[facility]    CSN: [id number] Arrival date & time: 12/05/22  1140      History   Chief Complaint Chief Complaint  Patient presents with   Laceration    RT thigh    HPI Richard Henderson is a 62 y.o. male presenting for laceration of the right anterior thigh that occurred 15 to 20 minutes prior to arrival to urgent care.  Patient says he was opening a new grill and an area of the packing was or appetite.  He states that he got his buck knife out and cut the packing toward himself.  He says that he accidentally cut his leg with his knife.  He says this knife was very sharp.  He states that he believes he is due for a tetanus immunization at this time.  Not reporting any difficulty walking or weakness of the leg.  No numbness reported.  Leg is actively bleeding mildly at this time.  No other complaints.  HPI  Past Medical History:  Diagnosis Date   Allergic rhinitis    Allergy    Cancer (HCC) 2013   noncancerous- TA polyp per pt   Colonic mass    Eczema    on hands   GERD (gastroesophageal reflux disease)    none since cpap   Hyperlipidemia    Hypertension    Serrated adenoma of rectum s/p TEM resection 27Jun2013 07/18/2011   Sleep apnea    USES C-PAP    Patient Active Problem List   Diagnosis Date Noted   Eczema 03/20/2015   Hyperglycemia 02/07/2014   Knee pain, right 10/23/2013   Erectile dysfunction 03/27/2012   Serrated adenoma of rectum s/p TEM resection 27Jun2013 07/18/2011   Colon cancer screening 02/24/2011   Obstructive sleep apnea 02/16/2010   Benign neoplasm of skin 01/25/2010   Hyperlipidemia 01/25/2010   Essential hypertension 01/25/2010   Allergic rhinitis 01/08/2009   GERD 01/08/2009   BACK PAIN, THORACIC REGION, LEFT 01/08/2009    Past Surgical History:  Procedure Laterality Date   COLONOSCOPY  2013   POLYPECTOMY     TRANSANAL ENDOSCOPIC MICROSURGERY  06/30/2011   WISDOM TOOTH EXTRACTION  2012       Home Medications     Prior to Admission medications   Medication Sig Start Date End Date Taking? Authorizing Provider  amLODipine (NORVASC) 5 MG tablet TAKE 1 TABLET(5 MG) BY MOUTH DAILY 08/15/20  Yes Sandford Craze, NP  amoxicillin-clavulanate (AUGMENTIN) 875-125 MG tablet Take 1 tablet by mouth every 12 (twelve) hours for 7 days. 12/05/22 12/12/22 Yes Eusebio Friendly B, PA-C  atorvastatin (LIPITOR) 10 MG tablet TAKE 1 TABLET(10 MG) BY MOUTH DAILY 09/14/20  Yes Sandford Craze, NP  valsartan (DIOVAN) 40 MG tablet TAKE 1 TABLET BY MOUTH EVERY DAY 01/08/21  Yes Sandford Craze, NP  augmented betamethasone dipropionate (DIPROLENE-AF) 0.05 % ointment Apply topically 2 (two) times daily.    [provider]  Azelastine-Fluticasone 137-50 MCG/ACT SUSP Place 1 spray into the nose in the morning and at bedtime. Patient not taking: Reported on 11/09/2020 06/04/19   Sandford Craze, NP  cetirizine (ZYRTEC) 10 MG tablet Take 10 mg by mouth as needed.    [provider]  ciprofloxacin (CIPRO) 500 MG tablet Take 1 tablet (500 mg total) by mouth 2 (two) times daily. 12/25/20   Pyrtle, Carie Caddy, MD  metroNIDAZOLE (FLAGYL) 250 MG tablet Take 1 tablet (250 mg total) by mouth 3 (three) times daily. 12/25/20   Pyrtle, Carie Caddy, MD  Multiple Vitamin (MULTIVITAMIN) tablet Take 1 tablet by mouth daily as needed.     [provider]    Family History Family History  Problem Relation Age of Onset   Prostate cancer Father    Alzheimer's disease Father    Arthritis Other    Colon cancer Neg Hx    Colon polyps Neg Hx    Esophageal cancer Neg Hx    Rectal cancer Neg Hx    Stomach cancer Neg Hx     Social History Social History   Tobacco Use   Smoking status: Never   Smokeless tobacco: Never  Vaping Use   Vaping status: Never Used  Substance Use Topics   Alcohol use: Yes    Comment: OCC- very OCC   Drug use: No     Allergies   Patient has no known allergies.   Review of Systems Review of  Systems  Musculoskeletal:  Negative for arthralgias, gait problem and joint swelling.  Skin:  Positive for wound.  Neurological:  Negative for weakness and numbness.     Physical Exam Triage Vital Signs ED Triage Vitals  Encounter Vitals Group     BP 12/05/22 1149 (!) 160/85     Systolic BP Percentile --      Diastolic BP Percentile --      Pulse Rate 12/05/22 1149 69     Resp --      Temp 12/05/22 1149 97.9 F (36.6 C)     Temp Source 12/05/22 1149 Oral     SpO2 12/05/22 1149 99 %     Weight 12/05/22 1148 200 lb (90.7 kg)     Height 12/05/22 1148 5\' 8"  (1.727 m)     Head Circumference --      Peak Flow --      Pain Score 12/05/22 1148 4     Pain Loc --      Pain Education --      Exclude from Growth Chart --    No data found.  Updated Vital Signs BP (!) 160/85 (BP Location: Left Arm)   Pulse 69   Temp 97.9 F (36.6 C) (Oral)   Ht 5\' 8"  (1.727 m)   Wt 200 lb (90.7 kg)   SpO2 99%   BMI 30.41 kg/m       Physical Exam Vitals and nursing note reviewed.  Constitutional:      General: He is not in acute distress.    Appearance: Normal appearance. He is well-developed. He is not ill-appearing.  HENT:     Head: Normocephalic and atraumatic.  Eyes:     General: No scleral icterus.    Conjunctiva/sclera: Conjunctivae normal.  Cardiovascular:     Rate and Rhythm: Normal rate and regular rhythm.  Pulmonary:     Effort: Pulmonary effort is normal. No respiratory distress.  Musculoskeletal:     Cervical back: Neck supple.  Skin:    General: Skin is warm and dry.     Capillary Refill: Capillary refill takes less than 2 seconds.     Findings: Laceration (3 cm linear laceration right anterior thigh--exposed fatty tissue, mild bleeding) present.  Neurological:     General: No focal deficit present.     Mental Status: He is alert. Mental status is at baseline.     Motor: No weakness.     Gait: Gait normal.  Psychiatric:        Mood and Affect: Mood normal.  Behavior: Behavior normal.      UC Treatments / Results  Labs (all labs ordered are listed, but only abnormal results are displayed) Labs Reviewed - No data to display  EKG   Radiology No results found.  Procedures Laceration Repair  Date/Time: 12/05/2022 2:05 PM  Performed by: Shirlee Latch, PA-C Authorized by: Shirlee Latch, PA-C   Consent:    Consent obtained:  Verbal   Consent given by:  Patient   Risks discussed:  Infection, pain, retained foreign body, poor cosmetic result and poor wound healing Anesthesia:    Anesthesia method:  Local infiltration   Local anesthetic:  Lidocaine 1% w/o epi Laceration details:    Location:  Leg   Leg location:  R upper leg   Length (cm):  3 Exploration:    Hemostasis achieved with:  Direct pressure   Contaminated: no   Treatment:    Area cleansed with:  Saline   Amount of cleaning:  Standard   Irrigation solution:  Sterile saline   Visualized foreign bodies/material removed: no     Debridement:  None Skin repair:    Repair method:  Sutures   Suture size:  4-0   Suture material:  Nylon   Suture technique:  Simple interrupted   Number of sutures:  4 Approximation:    Approximation:  Close Repair type:    Repair type:  Simple Post-procedure details:    Dressing:  Non-adherent dressing   Procedure completion:  Tolerated well, no immediate complications  (including critical care time)  Medications Ordered in UC Medications  Tdap (BOOSTRIX) injection 0.5 mL (0.5 mLs Intramuscular Given 12/05/22 1244)    Initial Impression / Assessment and Plan / UC Course  I have reviewed the triage vital signs and the nursing notes.  Pertinent labs & imaging results that were available during my care of the patient were reviewed by me and considered in my medical decision making (see chart for details).   62 year old male presents for laceration of right thigh that occurred immediately prior to arrival urgent care.  He cut it with  his dirty pocket knife when he was cutting a package.  Tetanus not up-to-date.  Request to update tetanus.  3 cm clean laceration of the right anterior thigh.  Patient agrees to wound repair.  4 simple running sutures placed.  Discussed wound care guidelines.  Advised to return in a week for suture removal.  Sent Augmentin for prophylaxis.  Tetanus was updated.  Reviewed return sooner for any signs of infection or other concerns.  Discussed RICE guidelines, Tylenol and Motrin.   Final Clinical Impressions(s) / UC Diagnoses   Final diagnoses:  Laceration of right thigh, initial encounter     Discharge Instructions      -Tetanus was updated today. - Start antibiotic prophylaxis to try to prevent infection.  However, if you notice increased redness or pustular drainage please return for reevaluation. - Do not get the wound wet for 48 to 72 hours and then after that clean it gently with soap and water daily.  Keep bandaged for the next few days. - Return in a week for suture removal. - May apply ice covered in cloth to your thigh and elevated to help with swelling.  You can also take anti-inflammatory medication.     ED Prescriptions     Medication Sig Dispense Auth. Provider   amoxicillin-clavulanate (AUGMENTIN) 875-125 MG tablet Take 1 tablet by mouth every 12 (twelve) hours for 7 days. 14 tablet  Shirlee Latch, PA-C      PDMP not reviewed this encounter.   Shirlee Latch, PA-C 12/05/22 1407

## 2022-12-05 NOTE — ED Triage Notes (Signed)
Pt presents to UC for laceration to RT thigh x15-20 minutes ago with knife while opening new grill. Pt unsure of last tetanus.

## 2022-12-05 NOTE — Discharge Instructions (Addendum)
-  Tetanus was updated today. - Start antibiotic prophylaxis to try to prevent infection.  However, if you notice increased redness or pustular drainage please return for reevaluation. - Do not get the wound wet for 48 to 72 hours and then after that clean it gently with soap and water daily.  Keep bandaged for the next few days. - Return in a week for suture removal. - May apply ice covered in cloth to your thigh and elevated to help with swelling.  You can also take anti-inflammatory medication.

## 2022-12-13 ENCOUNTER — Ambulatory Visit
Admission: RE | Admit: 2022-12-13 | Discharge: 2022-12-13 | Disposition: A | Discharge status: Home / Self Care | Payer: BC Managed Care – PPO | Source: Ambulatory Visit

## 2022-12-13 DIAGNOSIS — Z4802 Encounter for removal of sutures: Secondary | ICD-10-CM

## 2022-12-13 NOTE — ED Triage Notes (Signed)
Patient is here for suture removal from laceration on 12/05/22. 4 sutures on right thigh. Patient is concerned of bruise on the back of thigh.   Spoke with provider about concern, provider states that bruise is normal due to blood. Wound is clean and healed good.

## 2023-01-23 DIAGNOSIS — D225 Melanocytic nevi of trunk: Secondary | ICD-10-CM | POA: Diagnosis not present

## 2023-01-23 DIAGNOSIS — L853 Xerosis cutis: Secondary | ICD-10-CM | POA: Diagnosis not present

## 2023-01-23 DIAGNOSIS — D235 Other benign neoplasm of skin of trunk: Secondary | ICD-10-CM | POA: Diagnosis not present

## 2023-01-23 DIAGNOSIS — L570 Actinic keratosis: Secondary | ICD-10-CM | POA: Diagnosis not present

## 2023-01-23 DIAGNOSIS — L821 Other seborrheic keratosis: Secondary | ICD-10-CM | POA: Diagnosis not present

## 2023-01-23 DIAGNOSIS — L905 Scar conditions and fibrosis of skin: Secondary | ICD-10-CM | POA: Diagnosis not present

## 2023-03-02 ENCOUNTER — Ambulatory Visit (INDEPENDENT_AMBULATORY_CARE_PROVIDER_SITE_OTHER): Payer: BC Managed Care – PPO | Admitting: Physician Assistant

## 2023-03-02 ENCOUNTER — Encounter: Payer: Self-pay | Admitting: Physician Assistant

## 2023-03-02 VITALS — BP 142/78 | HR 68 | Temp 98.3°F | Ht 68.0 in | Wt 211.0 lb

## 2023-03-02 DIAGNOSIS — I10 Essential (primary) hypertension: Secondary | ICD-10-CM | POA: Diagnosis not present

## 2023-03-02 DIAGNOSIS — E7800 Pure hypercholesterolemia, unspecified: Secondary | ICD-10-CM | POA: Diagnosis not present

## 2023-03-02 DIAGNOSIS — D235 Other benign neoplasm of skin of trunk: Secondary | ICD-10-CM

## 2023-03-02 DIAGNOSIS — R61 Generalized hyperhidrosis: Secondary | ICD-10-CM | POA: Diagnosis not present

## 2023-03-02 DIAGNOSIS — R739 Hyperglycemia, unspecified: Secondary | ICD-10-CM | POA: Diagnosis not present

## 2023-03-02 NOTE — Patient Instructions (Addendum)

## 2023-03-02 NOTE — Progress Notes (Addendum)
Date:  03/02/2023   Name:  Richard Henderson   DOB:  [birth year]   MRN:  [id number]   Chief Complaint: Establish Care, Skin Discoloration (X6 months, On back, has had it removed from dermatologist, still itches), and Night Sweats (X1.5 months, happened 3 years ago had test done everything was fine, happening again )  HPI Richard Henderson is a very pleasant 63 y.o. male with a history of HTN, HLD, and prior serrated adenoma requiring partial proctectomy 2013 who presents to the clinic today to establish care.  Previously under the care of Dr. Drema Balzarine, but recently moved to the area in Nov.  Estimates his last physical/labs was roughly 1 year ago.  He brings a couple of concerns to my attention today:  HTN - longstanding, takes amlodipine and valsartan for this.  Does not monitor blood pressure at home, but has a cuff.  Recently tells me there was a bit of confusion regarding his medications, and he was accidentally taking amlodipine 10 mg and no valsartan at all; this has since been resolved as of Sunday and he is back to his usual regimen of amlodipine 5 mg and valsartan 40 mg daily.  Takes his medications every night.  Night sweats - Since January he has been waking up hot several times per week in the early morning. Last week woke up sweaty twice but also informs me he had some kind of viral illness. Upon moving, he and his wife got a new bed and new bedding including a fairly thick comforter.  Denies any unexpected weight loss, pronounced fatigue, change in appetite, or family history of hematologic malignancy.  Skin lesion on back - sees dermatology in [place] for this, has been biopsied/excised twice (most recently 3 months ago) and both times benign. Patient states it is still itchy. Has f/u sched for June 2025.   Medication list has been reviewed and updated.  Current Meds  Medication Sig   amLODipine (NORVASC) 5 MG tablet TAKE 1 TABLET(5 MG) BY MOUTH DAILY   atorvastatin (LIPITOR) 10 MG  tablet TAKE 1 TABLET(10 MG) BY MOUTH DAILY   augmented betamethasone dipropionate (DIPROLENE-AF) 0.05 % ointment Apply topically as needed.   cetirizine (ZYRTEC) 10 MG tablet Take 10 mg by mouth as needed.   valsartan (DIOVAN) 40 MG tablet TAKE 1 TABLET BY MOUTH EVERY DAY   [DISCONTINUED] Azelastine-Fluticasone 137-50 MCG/ACT SUSP Place 1 spray into the nose in the morning and at bedtime.   [DISCONTINUED] Multiple Vitamin (MULTIVITAMIN) tablet Take 1 tablet by mouth daily as needed.      Review of Systems  Patient Active Problem List   Diagnosis Date Noted   Diverticulosis 10/05/2021   Eczema 03/20/2015   Hyperglycemia 02/07/2014   Knee pain, right 10/23/2013   Erectile dysfunction 03/27/2012   Serrated adenoma of rectum s/p TEM resection 27Jun2013 07/18/2011   Colon cancer screening 02/24/2011   Obstructive sleep apnea 02/16/2010   Benign neoplasm of skin 01/25/2010   Hypercholesterolemia 01/25/2010   Hypertension 01/25/2010   GERD 01/08/2009   BACK PAIN, THORACIC REGION, LEFT 01/08/2009   Allergic rhinitis, unspecified 04/25/2007    No Known Allergies  Immunization History  Administered Date(s) Administered   Influenza Whole 11/18/2008   Influenza, Seasonal, Injecte, Preservative Fre 09/01/2012   Influenza,inj,Quad PF,6+ Mos 09/17/2014, 09/25/2015, 09/28/2016, 09/29/2017, 09/14/2020   Influenza-Unspecified 09/30/2013, 11/07/2019   Janssen (J&J) SARS-COV-2 Vaccination 03/17/2019   Moderna Sars-Covid-2 Vaccination 11/07/2019   Tdap 03/27/2012, 06/30/2017, 12/05/2022   Zoster Recombinant(Shingrix) 09/29/2017,  12/05/2017    Past Surgical History:  Procedure Laterality Date   COLON SURGERY  2013   Benign cyst   COLONOSCOPY  01/04/2011   POLYPECTOMY     TRANSANAL ENDOSCOPIC MICROSURGERY  06/30/2011   WISDOM TOOTH EXTRACTION  01/03/2010    Social History   Tobacco Use   Smoking status: Never   Smokeless tobacco: Never  Vaping Use   Vaping status: Never Used   Substance Use Topics   Alcohol use: Yes    Alcohol/week: 1.0 standard drink of alcohol    Types: 1 Cans of beer per week    Comment: OCC- very OCC   Drug use: No    Family History  Problem Relation Age of Onset   Stroke Mother    Arthritis Mother    Celiac disease Mother    Other Mother    Prostate cancer Father    Alzheimer's disease Father    Arthritis Other    Colon cancer Neg Hx    Colon polyps Neg Hx    Esophageal cancer Neg Hx    Rectal cancer Neg Hx    Stomach cancer Neg Hx         03/02/2023    9:23 AM  GAD 7 : Generalized Anxiety Score  Nervous, Anxious, on Edge 0  Control/stop worrying 0  Worry too much - different things 0  Trouble relaxing 0  Restless 0  Easily annoyed or irritable 0  Afraid - awful might happen 0  Total GAD 7 Score 0  Anxiety Difficulty Not difficult at all       03/02/2023    9:23 AM 09/13/2019    7:44 AM 09/29/2017    8:00 AM  Depression screen PHQ 2/9  Decreased Interest 0 0 0  Down, Depressed, Hopeless 0 0 0  PHQ - 2 Score 0 0 0  Altered sleeping   0  Tired, decreased energy   1  Change in appetite   0  Feeling bad or failure about yourself    0  Trouble concentrating   0  Moving slowly or fidgety/restless   0  Suicidal thoughts   0  PHQ-9 Score   1  Difficult doing work/chores   Not difficult at all    BP Readings from Last 3 Encounters:  03/02/23 (!) 142/78  12/13/22 (!) 143/78  12/05/22 (!) 160/85    Wt Readings from Last 3 Encounters:  03/02/23 211 lb (95.7 kg)  12/05/22 200 lb (90.7 kg)  11/23/20 205 lb (93 kg)    BP (!) 142/78 (BP Location: Left Arm, Patient Position: Sitting, Cuff Size: Normal)   Pulse 68   Temp 98.3 F (36.8 C)   Ht 5\' 8"  (1.727 m)   Wt 211 lb (95.7 kg)   SpO2 96%   BMI 32.08 kg/m   Physical Exam Vitals and nursing note reviewed.  Constitutional:      Appearance: Normal appearance.  Neck:     Vascular: No carotid bruit.  Cardiovascular:     Rate and Rhythm: Normal rate and  regular rhythm.     Heart sounds: No murmur heard.    No friction rub. No gallop.  Pulmonary:     Effort: Pulmonary effort is normal.     Breath sounds: Normal breath sounds.  Abdominal:     General: There is no distension.  Musculoskeletal:        General: Normal range of motion.  Skin:    General: Skin is warm and  dry.     Comments: 5 mm scabbed macule of the right mid-back with surrounding erythema and central brown pigmentation.   Neurological:     Mental Status: He is alert and oriented to person, place, and time.     Gait: Gait is intact.  Psychiatric:        Mood and Affect: Mood and affect normal.     Recent Labs     Component Value Date/Time   NA 140 01/06/2021 1408   K 4.2 01/06/2021 1408   CL 99 01/06/2021 1408   CO2 26 01/06/2021 1408   GLUCOSE 122 (H) 01/06/2021 1408   GLUCOSE 112 (H) 09/14/2020 0845   BUN 14 01/06/2021 1408   CREATININE 0.84 01/06/2021 1408   CREATININE 0.96 09/13/2019 0804   CALCIUM 9.0 01/06/2021 1408   PROT 6.2 01/06/2021 1408   ALBUMIN 4.3 01/06/2021 1408   AST 17 01/06/2021 1408   ALT 25 01/06/2021 1408   ALKPHOS 90 01/06/2021 1408   BILITOT 0.4 01/06/2021 1408   GFRNONAA 89 08/12/2016 1526   GFRNONAA >89 06/26/2013 0832   GFRAA 103 08/12/2016 1526   GFRAA >89 06/26/2013 0832    Lab Results  Component Value Date   WBC 5.9 01/06/2021   HGB 16.0 01/06/2021   HCT 47.7 01/06/2021   MCV 88 01/06/2021   PLT 227 01/06/2021   Lab Results  Component Value Date   HGBA1C 5.5 09/13/2019   Lab Results  Component Value Date   CHOL 167 09/14/2020   HDL 40.40 09/14/2020   LDLCALC 93 09/14/2020   TRIG 168.0 (H) 09/14/2020   CHOLHDL 4 09/14/2020   Lab Results  Component Value Date   TSH 2.03 09/29/2017     Assessment and Plan:  1. Primary hypertension (Primary) Discussed with patient elevated systolic x 2 today.  For now, continue the current medication regimen.  Valsartan is actually below the recommended initial therapeutic  dose, so we might increase this in the future.  Encouraged home BP monitoring with a log for my review next time, as this is an important piece of the puzzle.  Patient also intends to work on lifestyle measures including physical activity and weight loss, which are likely to drive the blood pressure down.  Will check baseline labs and request prior records.  - CBC with Differential/Platelet - Comprehensive metabolic panel - TSH - Lipid panel  2. Hypercholesterolemia Patient had coffee creamer today, but is otherwise fasting.  Continue atorvastatin 10 mg for now. - Lipid panel  3. Benign neoplasm of skin of trunk Patient reassured no concerning features on exam today.  For the itching, might consider applying a small amount of lotion to the site each night before bed.  Follow-up with dermatology as scheduled.  4. Night sweats Patient reassured I think this is likely related to the thickness of his comforter retaining heat, complicated by recent viral illness.  We will check his routine labs today, but I have also advised him to limit fluid intake within the hour of bedtime and consider using lighter bedding.  - CBC with Differential/Platelet - Comprehensive metabolic panel - TSH    Return in about 4 weeks (around 03/30/2023) for OV f/u chronic conditions.   Today's visit billed for provider time of 46 minutes including comprehensive chart review, physical exam, addressing multiple chronic conditions, addressing acute concern, ordering of labs, and documentation.   Alvester Morin, PA-C, DMSc, Nutritionist [facility] [phone]   

## 2023-03-03 ENCOUNTER — Other Ambulatory Visit: Payer: Self-pay | Admitting: Physician Assistant

## 2023-03-03 LAB — COMPREHENSIVE METABOLIC PANEL
ALT: 19 [IU]/L (ref 0–44)
AST: 15 [IU]/L (ref 0–40)
Albumin: 4.5 g/dL (ref 3.9–4.9)
Alkaline Phosphatase: 105 [IU]/L (ref 44–121)
BUN/Creatinine Ratio: 14 (ref 10–24)
BUN: 15 mg/dL (ref 8–27)
Bilirubin Total: 0.5 mg/dL (ref 0.0–1.2)
CO2: 27 mmol/L (ref 20–29)
Calcium: 9.4 mg/dL (ref 8.6–10.2)
Chloride: 101 mmol/L (ref 96–106)
Creatinine, Ser: 1.05 mg/dL (ref 0.76–1.27)
Globulin, Total: 2.2 g/dL (ref 1.5–4.5)
Glucose: 101 mg/dL — ABNORMAL HIGH (ref 70–99)
Potassium: 5.1 mmol/L (ref 3.5–5.2)
Sodium: 141 mmol/L (ref 134–144)
Total Protein: 6.7 g/dL (ref 6.0–8.5)
eGFR: 80 mL/min/{1.73_m2} (ref >59)

## 2023-03-03 LAB — CBC WITH DIFFERENTIAL/PLATELET
Basophils Absolute: 0 10*3/uL (ref 0.0–0.2)
Basos: 1 %
EOS (ABSOLUTE): 0.1 10*3/uL (ref 0.0–0.4)
Eos: 1 %
Hematocrit: 49.9 % (ref 37.5–51.0)
Hemoglobin: 16.3 g/dL (ref 13.0–17.7)
Immature Grans (Abs): 0 10*3/uL (ref 0.0–0.1)
Immature Granulocytes: 1 %
Lymphocytes Absolute: 1.6 10*3/uL (ref 0.7–3.1)
Lymphs: 24 %
MCH: 29.7 pg (ref 26.6–33.0)
MCHC: 32.7 g/dL (ref 31.5–35.7)
MCV: 91 fL (ref 79–97)
Monocytes Absolute: 0.6 10*3/uL (ref 0.1–0.9)
Monocytes: 9 %
Neutrophils Absolute: 4.3 10*3/uL (ref 1.4–7.0)
Neutrophils: 64 %
Platelets: 207 10*3/uL (ref 150–450)
RBC: 5.49 x10E6/uL (ref 4.14–5.80)
RDW: 12.6 % (ref 11.6–15.4)
WBC: 6.7 10*3/uL (ref 3.4–10.8)

## 2023-03-03 LAB — LIPID PANEL
Chol/HDL Ratio: 4.6 {ratio} (ref 0.0–5.0)
Cholesterol, Total: 201 mg/dL — ABNORMAL HIGH (ref 100–199)
HDL: 44 mg/dL (ref >39)
LDL Chol Calc (NIH): 135 mg/dL — ABNORMAL HIGH (ref 0–99)
Triglycerides: 121 mg/dL (ref 0–149)
VLDL Cholesterol Cal: 22 mg/dL (ref 5–40)

## 2023-03-03 LAB — TSH: TSH: 2.26 u[IU]/mL (ref 0.450–4.500)

## 2023-03-03 MED ORDER — ATORVASTATIN CALCIUM 20 MG PO TABS: ORAL_TABLET | ORAL | 1 refills | Status: DC

## 2023-03-04 LAB — HGB A1C W/O EAG: Hgb A1c MFr Bld: 5.9 % — ABNORMAL HIGH (ref 4.8–5.6)

## 2023-03-04 LAB — SPECIMEN STATUS REPORT

## 2023-03-06 ENCOUNTER — Encounter: Payer: Self-pay | Admitting: Physician Assistant

## 2023-03-06 DIAGNOSIS — R7303 Prediabetes: Secondary | ICD-10-CM | POA: Insufficient documentation

## 2023-03-30 ENCOUNTER — Ambulatory Visit: Payer: BC Managed Care – PPO | Admitting: Physician Assistant

## 2023-03-31 ENCOUNTER — Ambulatory Visit (INDEPENDENT_AMBULATORY_CARE_PROVIDER_SITE_OTHER): Payer: BC Managed Care – PPO | Admitting: Physician Assistant

## 2023-03-31 VITALS — BP 136/68 | HR 83 | Resp 16 | Ht 68.0 in | Wt 212.0 lb

## 2023-03-31 DIAGNOSIS — I10 Essential (primary) hypertension: Secondary | ICD-10-CM | POA: Diagnosis not present

## 2023-03-31 MED ORDER — VALSARTAN 40 MG PO TABS
80.0000 mg | ORAL_TABLET | Freq: Every day | ORAL | Status: DC
Start: 2023-03-31 — End: 2023-04-20

## 2023-03-31 NOTE — Assessment & Plan Note (Signed)
 Inadequate control in clinic and at home. Increase valsartan to 80 mg daily and evaluate response with home BP readings. Patient to send MyChart message if responding well to dose increase and tolerating well.

## 2023-03-31 NOTE — Progress Notes (Signed)
 Date:  03/31/2023   Name:  Richard Henderson   DOB:  04/01/1960   MRN:  [id number]   Chief Complaint: Follow-up  HPI Richard Henderson presents for 1 month f/u on chronic conditions, namely HTN for which he takes amlodipine 5 mg and valsartan 40 mg. Last visit we considered dose increase to valsartan 80 mg but he wanted to work on lifestyle changes first. He has been monitoring blood pressure at home with most of his readings 130s-140s systolic.   We also increased atorvastatin which is now 20 mg daily, tolerating well.   A1c 5.9% last time reflecting prediabetes, but again will be treating with lifestyle changes only.    Medication list has been reviewed and updated.  Current Meds  Medication Sig   amLODipine (NORVASC) 5 MG tablet TAKE 1 TABLET(5 MG) BY MOUTH DAILY   atorvastatin (LIPITOR) 20 MG tablet TAKE 1 TABLET(10 MG) BY MOUTH DAILY   augmented betamethasone dipropionate (DIPROLENE-AF) 0.05 % ointment Apply topically as needed.   cetirizine (ZYRTEC) 10 MG tablet Take 10 mg by mouth as needed.   fluticasone (FLONASE) 50 MCG/ACT nasal spray Place into the nose.   [DISCONTINUED] valsartan (DIOVAN) 40 MG tablet TAKE 1 TABLET BY MOUTH EVERY DAY     Review of Systems  Patient Active Problem List   Diagnosis Date Noted   Prediabetes 03/06/2023   Diverticulosis 10/05/2021   Eczema 03/20/2015   Knee pain, right 10/23/2013   Erectile dysfunction 03/27/2012   Serrated adenoma of rectum s/p TEM resection 27Jun2013 07/18/2011   Obstructive sleep apnea 02/16/2010   Benign neoplasm of skin 01/25/2010   Hypercholesterolemia 01/25/2010   Hypertension 01/25/2010   GERD 01/08/2009   Allergic rhinitis, unspecified 04/25/2007    No Known Allergies  Immunization History  Administered Date(s) Administered   Influenza Whole 11/18/2008   Influenza, Seasonal, Injecte, Preservative Fre 09/01/2012   Influenza,inj,Quad PF,6+ Mos 09/17/2014, 09/25/2015, 09/28/2016, 09/29/2017, 09/14/2020    Influenza-Unspecified 09/30/2013, 11/07/2019   Janssen (J&J) SARS-COV-2 Vaccination 03/17/2019   Moderna Sars-Covid-2 Vaccination 11/07/2019   Tdap 03/27/2012, 06/30/2017, 12/05/2022   Zoster Recombinant(Shingrix) 09/29/2017, 12/05/2017    Past Surgical History:  Procedure Laterality Date   COLON SURGERY  2013   Benign cyst   COLONOSCOPY  01/04/2011   POLYPECTOMY     TRANSANAL ENDOSCOPIC MICROSURGERY  06/30/2011   WISDOM TOOTH EXTRACTION  01/03/2010    Social History   Tobacco Use   Smoking status: Never   Smokeless tobacco: Never  Vaping Use   Vaping status: Never Used  Substance Use Topics   Alcohol use: Yes    Alcohol/week: 1.0 standard drink of alcohol    Types: 1 Cans of beer per week    Comment: OCC- very OCC   Drug use: No    Family History  Problem Relation Age of Onset   Stroke Mother    Arthritis Mother    Celiac disease Mother    Other Mother    Prostate cancer Father    Alzheimer's disease Father    Arthritis Other    Colon cancer Neg Hx    Colon polyps Neg Hx    Esophageal cancer Neg Hx    Rectal cancer Neg Hx    Stomach cancer Neg Hx         03/02/2023    9:23 AM  GAD 7 : Generalized Anxiety Score  Nervous, Anxious, on Edge 0  Control/stop worrying 0  Worry too much - different things 0  Trouble relaxing  0  Restless 0  Easily annoyed or irritable 0  Afraid - awful might happen 0  Total GAD 7 Score 0  Anxiety Difficulty Not difficult at all       03/02/2023    9:23 AM 09/13/2019    7:44 AM 09/29/2017    8:00 AM  Depression screen PHQ 2/9  Decreased Interest 0 0 0  Down, Depressed, Hopeless 0 0 0  PHQ - 2 Score 0 0 0  Altered sleeping   0  Tired, decreased energy   1  Change in appetite   0  Feeling bad or failure about yourself    0  Trouble concentrating   0  Moving slowly or fidgety/restless   0  Suicidal thoughts   0  PHQ-9 Score   1  Difficult doing work/chores   Not difficult at all    BP Readings from Last 3 Encounters:   03/31/23 136/68  03/02/23 (!) 142/78  12/13/22 (!) 143/78    Wt Readings from Last 3 Encounters:  03/31/23 212 lb (96.2 kg)  03/02/23 211 lb (95.7 kg)  12/05/22 200 lb (90.7 kg)    BP 136/68 (BP Location: Right Arm, Cuff Size: Large)   Pulse 83   Resp 16   Ht 5\' 8"  (1.727 m)   Wt 212 lb (96.2 kg)   SpO2 98%   BMI 32.23 kg/m   Physical Exam Vitals and nursing note reviewed.  Constitutional:      Appearance: Normal appearance.  Cardiovascular:     Rate and Rhythm: Normal rate.  Pulmonary:     Effort: Pulmonary effort is normal.  Abdominal:     General: There is no distension.  Musculoskeletal:        General: Normal range of motion.  Skin:    General: Skin is warm and dry.  Neurological:     Mental Status: He is alert and oriented to person, place, and time.     Gait: Gait is intact.  Psychiatric:        Mood and Affect: Mood and affect normal.     Recent Labs     Component Value Date/Time   NA 141 03/02/2023 1023   K 5.1 03/02/2023 1023   CL 101 03/02/2023 1023   CO2 27 03/02/2023 1023   GLUCOSE 101 (H) 03/02/2023 1023   GLUCOSE 112 (H) 09/14/2020 0845   BUN 15 03/02/2023 1023   CREATININE 1.05 03/02/2023 1023   CREATININE 0.96 09/13/2019 0804   CALCIUM 9.4 03/02/2023 1023   PROT 6.7 03/02/2023 1023   ALBUMIN 4.5 03/02/2023 1023   AST 15 03/02/2023 1023   ALT 19 03/02/2023 1023   ALKPHOS 105 03/02/2023 1023   BILITOT 0.5 03/02/2023 1023   GFRNONAA 89 08/12/2016 1526   GFRNONAA >89 06/26/2013 0832   GFRAA 103 08/12/2016 1526   GFRAA >89 06/26/2013 0832    Lab Results  Component Value Date   WBC 6.7 03/02/2023   HGB 16.3 03/02/2023   HCT 49.9 03/02/2023   MCV 91 03/02/2023   PLT 207 03/02/2023   Lab Results  Component Value Date   HGBA1C 5.9 (H) 03/02/2023   Lab Results  Component Value Date   CHOL 201 (H) 03/02/2023   HDL 44 03/02/2023   LDLCALC 135 (H) 03/02/2023   TRIG 121 03/02/2023   CHOLHDL 4.6 03/02/2023   Lab Results   Component Value Date   TSH 2.260 03/02/2023     Assessment and Plan:  Primary hypertension Assessment &  Plan: Inadequate control in clinic and at home. Increase valsartan to 80 mg daily and evaluate response with home BP readings. Patient to send MyChart message if responding well to dose increase and tolerating well.   Orders: -     Valsartan; Take 2 tablets (80 mg total) by mouth daily.     Return in about 3 months (around 07/01/2023) for OV f/u chronic conditions.    Alvester Morin, PA-C, DMSc, Nutritionist [facility] [phone]

## 2023-04-20 ENCOUNTER — Other Ambulatory Visit: Payer: Self-pay | Admitting: Physician Assistant

## 2023-04-20 DIAGNOSIS — I10 Essential (primary) hypertension: Secondary | ICD-10-CM

## 2023-04-20 MED ORDER — VALSARTAN 80 MG PO TABS: 80.0000 mg | ORAL_TABLET | Freq: Every day | ORAL | 1 refills | Status: DC

## 2023-04-20 NOTE — Telephone Encounter (Signed)
 Please review.  KP

## 2023-05-01 DIAGNOSIS — G4733 Obstructive sleep apnea (adult) (pediatric): Secondary | ICD-10-CM | POA: Diagnosis not present

## 2023-05-25 ENCOUNTER — Other Ambulatory Visit: Payer: Self-pay | Admitting: Physician Assistant

## 2023-05-25 MED ORDER — AMLODIPINE BESYLATE 5 MG PO TABS: ORAL_TABLET | ORAL | 1 refills | Status: DC

## 2023-05-25 NOTE — Telephone Encounter (Signed)
 Please review.  KP

## 2023-06-23 ENCOUNTER — Encounter: Payer: Self-pay | Admitting: Physician Assistant

## 2023-06-23 ENCOUNTER — Ambulatory Visit (INDEPENDENT_AMBULATORY_CARE_PROVIDER_SITE_OTHER): Admitting: Physician Assistant

## 2023-06-23 VITALS — BP 130/68 | HR 75 | Temp 98.4°F | Ht 68.0 in

## 2023-06-23 DIAGNOSIS — I10 Essential (primary) hypertension: Secondary | ICD-10-CM | POA: Diagnosis not present

## 2023-06-23 DIAGNOSIS — E7800 Pure hypercholesterolemia, unspecified: Secondary | ICD-10-CM | POA: Diagnosis not present

## 2023-06-23 DIAGNOSIS — H1013 Acute atopic conjunctivitis, bilateral: Secondary | ICD-10-CM | POA: Diagnosis not present

## 2023-06-23 DIAGNOSIS — R7303 Prediabetes: Secondary | ICD-10-CM | POA: Diagnosis not present

## 2023-06-23 MED ORDER — OLOPATADINE HCL 0.1 % OP SOLN: 1.0000 [drp] | Freq: Two times a day (BID) | OPHTHALMIC | 2 refills | Status: AC

## 2023-06-23 NOTE — Patient Instructions (Signed)
 Reviewed the "Seven S's" of hypertension including salt, smoking, stimulants (e.g. caffeine), stress, sleep, snoring (OSA), sedentary lifestyle.

## 2023-06-23 NOTE — Assessment & Plan Note (Addendum)
 Sending olopatadine for patient to use until resolved.  Take home Zyrtec as soon as possible. Cold compress 1-2 times daily. Unlikely to be infectious, but precautions reviewed in the off chance that it is. Patient education attached.

## 2023-06-23 NOTE — Assessment & Plan Note (Signed)
 Seems slightly improved since increasing valsartan . Still not quite at goal but will hold off on further med adjustment at this time. There is room for lifestyle improvements which should get him to goal.   Encouraged plant-forward diet. Reviewed the Seven S's of hypertension including salt, smoking, stimulants (e.g. caffeine), stress, sleep, snoring (OSA), sedentary lifestyle.

## 2023-06-23 NOTE — Progress Notes (Signed)
 Date:  06/23/2023   Name:  Richard Henderson   DOB:  [birth year]   MRN:  [id number]   Chief Complaint: Hypertension (Patient presents today to follow up on HTN and lab work.)  HPI Richard Henderson returns to clinic for 3 mo f/u on HTN after increasing dose of valsartan  to 80 mg daily last time, also continues with amlodipine  5 mg daily.  Home blood pressure readings not recorded recently but he intends to get back to this.   We also increased atorvastatin  to 20 mg daily after last LDL was not at goal (135 on 03/02/2023). He has been doing well with this adjustment.  Regarding lifestyle measures, he reports there is still room for improvement particularly when it comes to physical activity. Used to walk a lot. Also intends to use his pool at home for swimming exercise. Also plans to get back to some resistance training.   Woke up this morning with right lower eyelid feeling inflamed. Initially vision was blurry but this has since cleared. Was recently handling grass/leaves, emptying into trash can, etc. Did not take Zyrtec last night but usually takes nightly.    Medication list has been reviewed and updated.  Current Meds  Medication Sig   amLODipine  (NORVASC ) 5 MG tablet TAKE 1 TABLET(5 MG) BY MOUTH DAILY   atorvastatin  (LIPITOR) 20 MG tablet TAKE 1 TABLET(10 MG) BY MOUTH DAILY   cetirizine (ZYRTEC) 10 MG tablet Take 10 mg by mouth as needed.   fluticasone  (FLONASE ) 50 MCG/ACT nasal spray Place into the nose.   olopatadine (PATANOL) 0.1 % ophthalmic solution Place 1 drop into both eyes 2 (two) times daily.   valsartan  (DIOVAN ) 80 MG tablet Take 1 tablet (80 mg total) by mouth daily.     Review of Systems  Patient Active Problem List   Diagnosis Date Noted   Allergic conjunctivitis of both eyes 06/23/2023   Prediabetes 03/06/2023   Diverticulosis 10/05/2021   Eczema 03/20/2015   Knee pain, right 10/23/2013   Erectile dysfunction 03/27/2012   Serrated adenoma of rectum s/p TEM resection  27Jun2013 07/18/2011   Obstructive sleep apnea 02/16/2010   Benign neoplasm of skin 01/25/2010   Hypercholesterolemia 01/25/2010   Hypertension 01/25/2010   GERD 01/08/2009   Allergic rhinitis, unspecified 04/25/2007    No Known Allergies  Immunization History  Administered Date(s) Administered   Influenza Whole 11/18/2008   Influenza, Seasonal, Injecte, Preservative Fre 09/01/2012   Influenza,inj,Quad PF,6+ Mos 09/17/2014, 09/25/2015, 09/28/2016, 09/29/2017, 09/14/2020   Influenza-Unspecified 09/30/2013, 11/07/2019   Janssen (J&J) SARS-COV-2 Vaccination 03/17/2019   Moderna Sars-Covid-2 Vaccination 11/07/2019   Tdap 03/27/2012, 06/30/2017, 12/05/2022   Zoster Recombinant(Shingrix ) 09/29/2017, 12/05/2017    Past Surgical History:  Procedure Laterality Date   COLON SURGERY  2013   Benign cyst   COLONOSCOPY  01/04/2011   POLYPECTOMY     TRANSANAL ENDOSCOPIC MICROSURGERY  06/30/2011   WISDOM TOOTH EXTRACTION  01/03/2010    Social History   Tobacco Use   Smoking status: Never   Smokeless tobacco: Never  Vaping Use   Vaping status: Never Used  Substance Use Topics   Alcohol use: Yes    Alcohol/week: 1.0 standard drink of alcohol    Types: 1 Cans of beer per week    Comment: OCC- very OCC   Drug use: No    Family History  Problem Relation Age of Onset   Stroke Mother    Arthritis Mother    Celiac disease Mother    Other Mother  Prostate cancer Father    Alzheimer's disease Father    Arthritis Other    Colon cancer Neg Hx    Colon polyps Neg Hx    Esophageal cancer Neg Hx    Rectal cancer Neg Hx    Stomach cancer Neg Hx         06/23/2023    8:05 AM 03/31/2023    8:42 AM 03/02/2023    9:23 AM  GAD 7 : Generalized Anxiety Score  Nervous, Anxious, on Edge 0 0 0  Control/stop worrying 0 0 0  Worry too much - different things 0 0 0  Trouble relaxing 0 0 0  Restless 0 0 0  Easily annoyed or irritable 0 0 0  Afraid - awful might happen 0 0 0  Total GAD 7  Score 0 0 0  Anxiety Difficulty Not difficult at all  Not difficult at all       06/23/2023    8:04 AM 03/31/2023    8:42 AM 03/02/2023    9:23 AM  Depression screen PHQ 2/9  Decreased Interest 0 0 0  Down, Depressed, Hopeless 0 0 0  PHQ - 2 Score 0 0 0  Altered sleeping 0    Tired, decreased energy 0    Change in appetite 0    Feeling bad or failure about yourself  0    Trouble concentrating 0    Moving slowly or fidgety/restless 0    Suicidal thoughts 0    PHQ-9 Score 0    Difficult doing work/chores Not difficult at all      BP Readings from Last 3 Encounters:  06/23/23 130/68  03/31/23 136/68  03/02/23 (!) 142/78    Wt Readings from Last 3 Encounters:  03/31/23 212 lb (96.2 kg)  03/02/23 211 lb (95.7 kg)  12/05/22 200 lb (90.7 kg)    BP 130/68 (Cuff Size: Normal)   Pulse 75   Temp 98.4 F (36.9 C)   Ht 5' 8 (1.727 m)   SpO2 97%   BMI 32.23 kg/m   Physical Exam Vitals and nursing note reviewed.  Constitutional:      Appearance: Normal appearance.   Eyes:     Conjunctiva/sclera:     Right eye: Right conjunctiva is injected. No exudate.    Left eye: Left conjunctiva is injected. No exudate.    Comments: OD lower lid appears mildly edematous. Severe palpebral conjunctivitis OD, mod OS. No purulent discharge or matting of lashes. No photophobia or palpebral tenderness.    Cardiovascular:     Rate and Rhythm: Normal rate and regular rhythm.     Heart sounds: No murmur heard.    No friction rub. No gallop.  Pulmonary:     Effort: Pulmonary effort is normal.     Breath sounds: Normal breath sounds.  Abdominal:     General: There is no distension.   Musculoskeletal:        General: Normal range of motion.   Skin:    General: Skin is warm and dry.   Neurological:     Mental Status: He is alert and oriented to person, place, and time.     Gait: Gait is intact.   Psychiatric:        Mood and Affect: Mood and affect normal.     Recent Labs      Component Value Date/Time   NA 141 03/02/2023 1023   K 5.1 03/02/2023 1023   CL 101 03/02/2023 1023  CO2 27 03/02/2023 1023   GLUCOSE 101 (H) 03/02/2023 1023   GLUCOSE 112 (H) 09/14/2020 0845   BUN 15 03/02/2023 1023   CREATININE 1.05 03/02/2023 1023   CREATININE 0.96 09/13/2019 0804   CALCIUM  9.4 03/02/2023 1023   PROT 6.7 03/02/2023 1023   ALBUMIN 4.5 03/02/2023 1023   AST 15 03/02/2023 1023   ALT 19 03/02/2023 1023   ALKPHOS 105 03/02/2023 1023   BILITOT 0.5 03/02/2023 1023   GFRNONAA 89 08/12/2016 1526   GFRNONAA >89 06/26/2013 0832   GFRAA 103 08/12/2016 1526   GFRAA >89 06/26/2013 0832    Lab Results  Component Value Date   WBC 6.7 03/02/2023   HGB 16.3 03/02/2023   HCT 49.9 03/02/2023   MCV 91 03/02/2023   PLT 207 03/02/2023   Lab Results  Component Value Date   HGBA1C 5.9 (H) 03/02/2023   Lab Results  Component Value Date   CHOL 201 (H) 03/02/2023   HDL 44 03/02/2023   LDLCALC 135 (H) 03/02/2023   TRIG 121 03/02/2023   CHOLHDL 4.6 03/02/2023   Lab Results  Component Value Date   TSH 2.260 03/02/2023     Assessment and Plan:  Primary hypertension Assessment & Plan: Seems slightly improved since increasing valsartan . Still not quite at goal but will hold off on further med adjustment at this time. There is room for lifestyle improvements which should get him to goal.   Encouraged plant-forward diet. Reviewed the Seven S's of hypertension including salt, smoking, stimulants (e.g. caffeine), stress, sleep, snoring (OSA), sedentary lifestyle.   Orders: -     Basic metabolic panel with GFR  Hypercholesterolemia Assessment & Plan: Repeat fasting labs today.  Continue atorvastatin  20 mg.   I recommend limiting consumption of foods high in saturated fats. High-fiber foods such as fruits, vegetables, beans, whole grains, and nuts are encouraged. As always, regular physical activity is recommended for cholesterol metabolism.   Orders: -     Lipid  panel  Prediabetes Assessment & Plan: Will defer recheck of A1c to next visit.  Encourage patient to incorporate daily walks as much as he can, even if just for 10 minutes at a time.  We discussed that a particularly favorable time to do this would be after dinner in the late evening hours before sunset.  He is agreeable to this.  We also discussed that strength training will be particularly helpful for bolstering his body's response to insulin.   Allergic conjunctivitis of both eyes Assessment & Plan: Sending olopatadine for patient to use until resolved.  Take home Zyrtec as soon as possible. Cold compress 1-2 times daily. Unlikely to be infectious, but precautions reviewed in the off chance that it is. Patient education attached.   Orders: -     Olopatadine HCl; Place 1 drop into both eyes 2 (two) times daily.  Dispense: 5 mL; Refill: 2     Return in about 4 months (around 10/23/2023) for OV f/u chronic conditions.    Cody Das, PA-C, DMSc, Nutritionist [facility] [phone]

## 2023-06-23 NOTE — Assessment & Plan Note (Signed)
 Will defer recheck of A1c to next visit.  Encourage patient to incorporate daily walks as much as he can, even if just for 10 minutes at a time.  We discussed that a particularly favorable time to do this would be after dinner in the late evening hours before sunset.  He is agreeable to this.  We also discussed that strength training will be particularly helpful for bolstering his body's response to insulin.

## 2023-06-23 NOTE — Assessment & Plan Note (Signed)
 Repeat fasting labs today.  Continue atorvastatin  20 mg.   I recommend limiting consumption of foods high in saturated fats. High-fiber foods such as fruits, vegetables, beans, whole grains, and nuts are encouraged. As always, regular physical activity is recommended for cholesterol metabolism.

## 2023-06-24 LAB — LIPID PANEL
Chol/HDL Ratio: 4.1 ratio (ref 0.0–5.0)
Cholesterol, Total: 157 mg/dL (ref 100–199)
HDL: 38 mg/dL — ABNORMAL LOW (ref >39)
LDL Chol Calc (NIH): 99 mg/dL (ref 0–99)
Triglycerides: 107 mg/dL (ref 0–149)
VLDL Cholesterol Cal: 20 mg/dL (ref 5–40)

## 2023-06-24 LAB — BASIC METABOLIC PANEL WITH GFR
BUN/Creatinine Ratio: 18 (ref 10–24)
BUN: 17 mg/dL (ref 8–27)
CO2: 22 mmol/L (ref 20–29)
Calcium: 9.4 mg/dL (ref 8.6–10.2)
Chloride: 103 mmol/L (ref 96–106)
Creatinine, Ser: 0.95 mg/dL (ref 0.76–1.27)
Glucose: 124 mg/dL — ABNORMAL HIGH (ref 70–99)
Potassium: 4.8 mmol/L (ref 3.5–5.2)
Sodium: 142 mmol/L (ref 134–144)
eGFR: 90 mL/min/{1.73_m2} (ref >59)

## 2023-06-26 ENCOUNTER — Ambulatory Visit: Payer: Self-pay | Admitting: Physician Assistant

## 2023-07-12 ENCOUNTER — Other Ambulatory Visit: Payer: Self-pay | Admitting: Physician Assistant

## 2023-07-12 DIAGNOSIS — D235 Other benign neoplasm of skin of trunk: Secondary | ICD-10-CM

## 2023-07-12 NOTE — Telephone Encounter (Signed)
 Please review. I dont see a derm referral for pt.  KP

## 2023-08-29 ENCOUNTER — Other Ambulatory Visit: Payer: Self-pay | Admitting: Physician Assistant

## 2023-08-31 NOTE — Telephone Encounter (Signed)
 Requested Prescriptions  Pending Prescriptions Disp Refills   atorvastatin  (LIPITOR) 20 MG tablet [Pharmacy Med Name: ATORVASTATIN  20MG  TABLETS] 90 tablet 3    Sig: TAKE 1 TABLET BY MOUTH DAILY     Cardiovascular:  Antilipid - Statins Failed - 08/31/2023 11:32 AM      Failed - Lipid Panel in normal range within the last 12 months    Cholesterol, Total  Date Value Ref Range Status  06/23/2023 157 100 - 199 mg/dL Final   LDL Cholesterol (Calc)  Date Value Ref Range Status  09/13/2019 108 (H) mg/dL (calc) Final    Comment:    Reference range: <100 . Desirable range <100 mg/dL for primary prevention;   <70 mg/dL for patients with CHD or diabetic patients  with > or = 2 CHD risk factors. SABRA LDL-C is now calculated using the Martin-Hopkins  calculation, which is a validated novel method providing  better accuracy than the Friedewald equation in the  estimation of LDL-C.  Gladis APPLETHWAITE et al. SANDREA. 7986;689(80): 2061-2068  (http://education.[facility].com/faq/FAQ164)    LDL Chol Calc (NIH)  Date Value Ref Range Status  06/23/2023 99 0 - 99 mg/dL Final   HDL  Date Value Ref Range Status  06/23/2023 38 (L) >39 mg/dL Final   Triglycerides  Date Value Ref Range Status  06/23/2023 107 0 - 149 mg/dL Final         Passed - Patient is not pregnant      Passed - Valid encounter within last 12 months    Recent Outpatient Visits           2 months ago Primary hypertension   [place] [facility] at [facility], Tor[place]io [place]QU[place], [place]   5 months ago Primary hypertension   [facility] at [facility], Toribio [place]QU[place], [place]   6 months ago Primary hypertension   [facility] at [facility], Toribio SQUIBB, [place]       Future Appointments             In 1 month Manya, Toribio SQUIBB, [place] [facility] Medicine at [facility], [phone] [place]

## 2023-10-19 ENCOUNTER — Other Ambulatory Visit: Payer: Self-pay | Admitting: Physician Assistant

## 2023-10-19 DIAGNOSIS — I10 Essential (primary) hypertension: Secondary | ICD-10-CM

## 2023-10-20 NOTE — Telephone Encounter (Signed)
 Requested Prescriptions  Pending Prescriptions Disp Refills   valsartan  (DIOVAN ) 80 MG tablet [Pharmacy Med Name: VALSARTAN  80MG  TABLETS] 90 tablet 0    Sig: TAKE 1 TABLET(80 MG) BY MOUTH DAILY     Cardiovascular:  Angiotensin Receptor Blockers Passed - 10/20/2023  2:41 PM      Passed - Cr in normal range and within 180 days    Creat  Date Value Ref Range Status  09/13/2019 0.96 0.70 - 1.33 mg/dL Final    Comment:    For patients >63 years of age, the reference limit for Creatinine is approximately 13% higher for people identified as African-American. .    Creatinine, Ser  Date Value Ref Range Status  06/23/2023 0.95 0.76 - 1.27 mg/dL Final         Passed - K in normal range and within 180 days    Potassium  Date Value Ref Range Status  06/23/2023 4.8 3.5 - 5.2 mmol/L Final         Passed - Patient is not pregnant      Passed - Last BP in normal range    BP Readings from Last 1 Encounters:  06/23/23 130/68         Passed - Valid encounter within last 6 months    Recent Outpatient Visits           3 months ago Primary hypertension   [place] [facility] at [facility], Toribio [place]QU[place], [place]   6 months ago Primary hypertension   [facility] at [facility], Toribio SQUIBB, [place]   7 months ago Primary hypertension   [facility] Medicine at [facility], Toribio SQUIBB, [place]       Future Appointments             In 4 days Manya, Toribio SQUIBB, [place] [facility] Medicine at [facility], [phone] [place]

## 2023-10-24 ENCOUNTER — Encounter: Payer: Self-pay | Admitting: Physician Assistant

## 2023-10-24 ENCOUNTER — Ambulatory Visit (INDEPENDENT_AMBULATORY_CARE_PROVIDER_SITE_OTHER): Admitting: Physician Assistant

## 2023-10-24 VITALS — BP 136/72 | HR 68 | Temp 98.3°F | Ht 68.0 in | Wt 212.0 lb

## 2023-10-24 DIAGNOSIS — I10 Essential (primary) hypertension: Secondary | ICD-10-CM

## 2023-10-24 DIAGNOSIS — Z23 Encounter for immunization: Secondary | ICD-10-CM

## 2023-10-24 DIAGNOSIS — E7800 Pure hypercholesterolemia, unspecified: Secondary | ICD-10-CM

## 2023-10-24 DIAGNOSIS — R7303 Prediabetes: Secondary | ICD-10-CM | POA: Diagnosis not present

## 2023-10-24 NOTE — Patient Instructions (Signed)
-  It was a pleasure to see you today! Please review your visit summary for helpful information -Lab results are usually available within 1-2 days and we will call once reviewed -I would encourage you to follow your care via MyChart where you can access lab results, notes, messages, and more -If you feel that we did a nice job today, please complete your after-visit survey and leave us  a Google review! Your CMA today was Itzel and your provider was Rolan Hoyle, PA-C, DMSc -Please return for follow-up in about 3 months

## 2023-10-24 NOTE — Assessment & Plan Note (Signed)
 Repeat A1c.  Recommend diet low in simple carbohydrates such as white starches (bread, pasta, rice) and refined sugar found in desserts and sweetened beverages including juice, sweet tea, and soda.

## 2023-10-24 NOTE — Assessment & Plan Note (Signed)
 Still not quite at goal but will hold off on further med adjustment at this time. There is room for lifestyle improvements which should get him to goal.   Consider the Seven S's of hypertension including soil (encouraged plant-forward whole food eating habits), salt (intake <2g), smoking, stimulants (e.g. caffeine), stress, sleep (quantity, quality, timing, snoring/OSA), and sedentary lifestyle (aim for 150 active minutes per week).

## 2023-10-24 NOTE — Progress Notes (Signed)
 Date:  10/24/2023   Name:  Richard Henderson   DOB:  01-03-1961   MRN:  [id number]   Chief Complaint: Hypertension  Hypertension   Tim returns for 3-month follow-up on chronic conditions including HTN, HLD, pre-DM. He would like to stop atorvastatin  because he wonders if he really derives benefit from it, and thinks it may be responsible for exertional joint aches and pains, so he would like to do a trial period without it to see if it makes a difference for his joints and if he can maintain reasonable cholesterol numbers with lifestyle interventions instead. Presently he goes on daily 20 minute walks with his dog but does not have any strength training. He enjoys eating salad some nights for dinner. Says he could stand to improve fiber in the diet.   Weight has been stable in the last 4 months.   He has not checked home BP in 2 months.   Medication list has been reviewed and updated.  Current Meds  Medication Sig   amLODipine  (NORVASC ) 5 MG tablet TAKE 1 TABLET(5 MG) BY MOUTH DAILY   cetirizine (ZYRTEC) 10 MG tablet Take 10 mg by mouth as needed.   fluticasone  (FLONASE ) 50 MCG/ACT nasal spray Place into the nose.   olopatadine  (PATANOL) 0.1 % ophthalmic solution Place 1 drop into both eyes 2 (two) times daily.   valsartan  (DIOVAN ) 80 MG tablet TAKE 1 TABLET(80 MG) BY MOUTH DAILY   [DISCONTINUED] atorvastatin  (LIPITOR) 20 MG tablet TAKE 1 TABLET BY MOUTH DAILY     Review of Systems  Patient Active Problem List   Diagnosis Date Noted   Allergic conjunctivitis of both eyes 06/23/2023   Prediabetes 03/06/2023   Diverticulosis 10/05/2021   Eczema 03/20/2015   Knee pain, right 10/23/2013   Erectile dysfunction 03/27/2012   Serrated adenoma of rectum s/p TEM resection 27Jun2013 07/18/2011   Obstructive sleep apnea 02/16/2010   Benign neoplasm of skin 01/25/2010   Hypercholesterolemia 01/25/2010   Hypertension 01/25/2010   GERD 01/08/2009   Allergic rhinitis, unspecified  04/25/2007    No Known Allergies  Immunization History  Administered Date(s) Administered   Influenza Whole 11/18/2008   Influenza, Seasonal, Injecte, Preservative Fre 09/01/2012, 10/24/2023   Influenza,inj,Quad PF,6+ Mos 09/17/2014, 09/25/2015, 09/28/2016, 09/29/2017, 09/14/2020   Influenza-Unspecified 09/30/2013, 11/07/2019   Janssen (J&J) SARS-COV-2 Vaccination 03/17/2019   Moderna Sars-Covid-2 Vaccination 11/07/2019   Tdap 03/27/2012, 06/30/2017, 12/05/2022   Zoster Recombinant(Shingrix ) 09/29/2017, 12/05/2017    Past Surgical History:  Procedure Laterality Date   COLON SURGERY  2013   Benign cyst   COLONOSCOPY  01/04/2011   POLYPECTOMY     TRANSANAL ENDOSCOPIC MICROSURGERY  06/30/2011   WISDOM TOOTH EXTRACTION  01/03/2010    Social History   Tobacco Use   Smoking status: Never   Smokeless tobacco: Never  Vaping Use   Vaping status: Never Used  Substance Use Topics   Alcohol use: Yes    Alcohol/week: 1.0 standard drink of alcohol    Types: 1 Cans of beer per week    Comment: OCC- very OCC   Drug use: No    Family History  Problem Relation Age of Onset   Stroke Mother    Arthritis Mother    Celiac disease Mother    Other Mother    Prostate cancer Father    Alzheimer's disease Father    Arthritis Other    Colon cancer Neg Hx    Colon polyps Neg Hx    Esophageal cancer  Neg Hx    Rectal cancer Neg Hx    Stomach cancer Neg Hx         10/24/2023    8:03 AM 06/23/2023    8:05 AM 03/31/2023    8:42 AM 03/02/2023    9:23 AM  GAD 7 : Generalized Anxiety Score  Nervous, Anxious, on Edge 0 0 0 0  Control/stop worrying 0 0 0 0  Worry too much - different things  0 0 0  Trouble relaxing  0 0 0  Restless  0 0 0  Easily annoyed or irritable  0 0 0  Afraid - awful might happen  0 0 0  Total GAD 7 Score  0 0 0  Anxiety Difficulty  Not difficult at all  Not difficult at all       10/24/2023    8:03 AM 06/23/2023    8:04 AM 03/31/2023    8:42 AM  Depression  screen PHQ 2/9  Decreased Interest 0 0 0  Down, Depressed, Hopeless 0 0 0  PHQ - 2 Score 0 0 0  Altered sleeping  0   Tired, decreased energy  0   Change in appetite  0   Feeling bad or failure about yourself   0   Trouble concentrating  0   Moving slowly or fidgety/restless  0   Suicidal thoughts  0   PHQ-9 Score  0   Difficult doing work/chores  Not difficult at all     BP Readings from Last 3 Encounters:  10/24/23 136/72  06/23/23 130/68  03/31/23 136/68    Wt Readings from Last 3 Encounters:  10/24/23 212 lb (96.2 kg)  03/31/23 212 lb (96.2 kg)  03/02/23 211 lb (95.7 kg)    BP 136/72 (Cuff Size: Normal)   Pulse 68   Temp 98.3 F (36.8 C) (Oral)   Ht 5' 8 (1.727 m)   Wt 212 lb (96.2 kg)   SpO2 98%   BMI 32.23 kg/m   Physical Exam Vitals and nursing note reviewed.  Constitutional:      Appearance: Normal appearance.  Cardiovascular:     Rate and Rhythm: Normal rate.  Pulmonary:     Effort: Pulmonary effort is normal.  Abdominal:     General: There is no distension.  Musculoskeletal:        General: Normal range of motion.  Skin:    General: Skin is warm and dry.  Neurological:     Mental Status: He is alert and oriented to person, place, and time.     Gait: Gait is intact.  Psychiatric:        Mood and Affect: Mood and affect normal.     Recent Labs     Component Value Date/Time   NA 142 06/23/2023 0847   K 4.8 06/23/2023 0847   CL 103 06/23/2023 0847   CO2 22 06/23/2023 0847   GLUCOSE 124 (H) 06/23/2023 0847   GLUCOSE 112 (H) 09/14/2020 0845   BUN 17 06/23/2023 0847   CREATININE 0.95 06/23/2023 0847   CREATININE 0.96 09/13/2019 0804   CALCIUM  9.4 06/23/2023 0847   PROT 6.7 03/02/2023 1023   ALBUMIN 4.5 03/02/2023 1023   AST 15 03/02/2023 1023   ALT 19 03/02/2023 1023   ALKPHOS 105 03/02/2023 1023   BILITOT 0.5 03/02/2023 1023   GFRNONAA 89 08/12/2016 1526   GFRNONAA >89 06/26/2013 0832   GFRAA 103 08/12/2016 1526   GFRAA >89  06/26/2013 9167  Lab Results  Component Value Date   WBC 6.7 03/02/2023   HGB 16.3 03/02/2023   HCT 49.9 03/02/2023   MCV 91 03/02/2023   PLT 207 03/02/2023   Lab Results  Component Value Date   HGBA1C 5.9 (H) 03/02/2023   HGBA1C 5.5 09/13/2019   HGBA1C 5.7 04/05/2017   Lab Results  Component Value Date   CHOL 157 06/23/2023   HDL 38 (L) 06/23/2023   LDLCALC 99 06/23/2023   TRIG 107 06/23/2023   CHOLHDL 4.1 06/23/2023   Lab Results  Component Value Date   TSH 2.260 03/02/2023      Assessment and Plan:  Primary hypertension Assessment & Plan: Still not quite at goal but will hold off on further med adjustment at this time. There is room for lifestyle improvements which should get him to goal.   Consider the Seven S's of hypertension including soil (encouraged plant-forward whole food eating habits), salt (intake <2g), smoking, stimulants (e.g. caffeine), stress, sleep (quantity, quality, timing, snoring/OSA), and sedentary lifestyle (aim for 150 active minutes per week).    Prediabetes Assessment & Plan: Repeat A1c.  Recommend diet low in simple carbohydrates such as white starches (bread, pasta, rice) and refined sugar found in desserts and sweetened beverages including juice, sweet tea, and soda.    Orders: -     Hemoglobin A1c  Hypercholesterolemia Assessment & Plan: Repeat fasting labs today.  Stop atorvastatin  but emphasized focus on lifestyle changes with fasting repeat next time.    I recommend limiting consumption of foods high in saturated fats. High-fiber foods such as fruits, vegetables, beans, whole grains, and nuts are encouraged. As always, regular physical activity is recommended for cholesterol metabolism.   Orders: -     Lipid panel  Encounter for immunization -     Flu vaccine trivalent PF, 6mos and older(Flulaval,Afluria,Fluarix,Fluzone)     Return in about 3 months (around 01/24/2024) for FASTING OV.    Rolan Hoyle, PA-C, DMSc,  Nutritionist [facility] [phone]

## 2023-10-24 NOTE — Assessment & Plan Note (Addendum)
 Repeat fasting labs today.  Stop atorvastatin  but emphasized focus on lifestyle changes with fasting repeat next time.    I recommend limiting consumption of foods high in saturated fats. High-fiber foods such as fruits, vegetables, beans, whole grains, and nuts are encouraged. As always, regular physical activity is recommended for cholesterol metabolism.

## 2023-10-25 ENCOUNTER — Ambulatory Visit: Payer: Self-pay | Admitting: Physician Assistant

## 2023-10-25 LAB — LIPID PANEL
Chol/HDL Ratio: 4.3 ratio (ref 0.0–5.0)
Cholesterol, Total: 170 mg/dL (ref 100–199)
HDL: 40 mg/dL (ref >39)
LDL Chol Calc (NIH): 108 mg/dL — ABNORMAL HIGH (ref 0–99)
Triglycerides: 120 mg/dL (ref 0–149)
VLDL Cholesterol Cal: 22 mg/dL (ref 5–40)

## 2023-10-25 LAB — HEMOGLOBIN A1C
Est. average glucose Bld gHb Est-mCnc: 123 mg/dL
Hgb A1c MFr Bld: 5.9 % — ABNORMAL HIGH (ref 4.8–5.6)

## 2023-12-02 ENCOUNTER — Other Ambulatory Visit: Payer: Self-pay | Admitting: Physician Assistant

## 2023-12-06 NOTE — Telephone Encounter (Signed)
 Requested Prescriptions  Pending Prescriptions Disp Refills   amLODipine  (NORVASC ) 5 MG tablet [Pharmacy Med Name: AMLODIPINE  BESYLATE 5MG  TABLETS] 90 tablet 0    Sig: TAKE 1 TABLET(5 MG) BY MOUTH DAILY     Cardiovascular: Calcium  Channel Blockers 2 Passed - 12/06/2023 11:59 AM      Passed - Last BP in normal range    BP Readings from Last 1 Encounters:  10/24/23 136/72         Passed - Last Heart Rate in normal range    Pulse Readings from Last 1 Encounters:  10/24/23 68         Passed - Valid encounter within last 6 months    Recent Outpatient Visits           1 month ago Primary hypertension   [place] [facility] at [facility], [place]   5 months ago Primary hypertension   [facility] at [facility], [place]   8 months ago Primary hypertension   [facility] Medicine at [facility], [place], [place]   9 months ago Primary hypertension   [facility] Medicine at [facility], [place], [place]

## 2023-12-11 ENCOUNTER — Other Ambulatory Visit: Payer: Self-pay | Admitting: Physician Assistant

## 2023-12-12 NOTE — Telephone Encounter (Signed)
 Requested Prescriptions  Refused Prescriptions Disp Refills   amLODipine  (NORVASC ) 5 MG tablet [Pharmacy Med Name: AMLODIPINE  BESYLATE 5MG  TABLETS] 90 tablet 0    Sig: TAKE 1 TABLET(5 MG) BY MOUTH DAILY     Cardiovascular: Calcium  Channel Blockers 2 Passed - 12/12/2023  4:57 PM      Passed - Last BP in normal range    BP Readings from Last 1 Encounters:  10/24/23 136/72         Passed - Last Heart Rate in normal range    Pulse Readings from Last 1 Encounters:  10/24/23 68         Passed - Valid encounter within last 6 months    Recent Outpatient Visits           1 month ago Primary hypertension   [place] [facility] at [facility], [place]   5 months ago Primary hypertension   [facility] at [facility], [place]   8 months ago Primary hypertension   [facility] at [facility], [place], [place]   9 months ago Primary hypertension   [facility] Medicine at [facility], [place], [place]

## 2024-01-16 ENCOUNTER — Other Ambulatory Visit: Payer: Self-pay | Admitting: Physician Assistant

## 2024-01-16 DIAGNOSIS — I10 Essential (primary) hypertension: Secondary | ICD-10-CM

## 2024-01-17 NOTE — Telephone Encounter (Signed)
 Requested Prescriptions  Pending Prescriptions Disp Refills   valsartan  (DIOVAN ) 80 MG tablet [Pharmacy Med Name: VALSARTAN  80MG  TABLETS] 90 tablet 0    Sig: TAKE 1 TABLET(80 MG) BY MOUTH DAILY     Cardiovascular:  Angiotensin Receptor Blockers Failed - 01/17/2024  3:44 PM      Failed - Cr in normal range and within 180 days    Creat  Date Value Ref Range Status  09/13/2019 0.96 0.70 - 1.33 mg/dL Final    Comment:    For patients >64 years of age, the reference limit for Creatinine is approximately 13% higher for people identified as African-American. .    Creatinine, Ser  Date Value Ref Range Status  06/23/2023 0.95 0.76 - 1.27 mg/dL Final         Failed - K in normal range and within 180 days    Potassium  Date Value Ref Range Status  06/23/2023 4.8 3.5 - 5.2 mmol/L Final         Passed - Patient is not pregnant      Passed - Last BP in normal range    BP Readings from Last 1 Encounters:  10/24/23 136/72         Passed - Valid encounter within last 6 months    Recent Outpatient Visits           2 months ago Primary hypertension   [place] [facility] at [facility], [place]   6 months ago Primary hypertension   [facility] Medicine at [facility], [place], [place]   9 months ago Primary hypertension   [facility] Primary Care & Sports Medicine at [facility], [place], [place]   10 months ago Primary hypertension   [facility] Medicine at [facility], [place], [place]

## 2024-01-24 ENCOUNTER — Encounter: Payer: Self-pay | Admitting: Physician Assistant

## 2024-01-24 ENCOUNTER — Ambulatory Visit (INDEPENDENT_AMBULATORY_CARE_PROVIDER_SITE_OTHER): Admitting: Physician Assistant

## 2024-01-24 VITALS — BP 122/72 | HR 71 | Temp 98.4°F | Ht 68.0 in | Wt 208.0 lb

## 2024-01-24 DIAGNOSIS — E7800 Pure hypercholesterolemia, unspecified: Secondary | ICD-10-CM | POA: Diagnosis not present

## 2024-01-24 MED ORDER — FLUTICASONE PROPIONATE 50 MCG/ACT NA SUSP: 1.0000 | Freq: Every day | NASAL | 2 refills | Status: AC

## 2024-01-24 NOTE — Progress Notes (Signed)
 "   Date:  01/24/2024   Name:  Richard Henderson   DOB:  1960/08/18   MRN:  [id number]   Chief Complaint: Medical Management of Chronic Issues  HPI  Tim returns for fasting 27-month follow-up on chronic conditions including HTN, HLD, pre-DM.  Last visit he wished to stop atorvastatin  due to suspicion for arthralgia so we agreed to a trial period without it, due for repeat lipids today.  He goes on daily 1-mile  walk with his dog but does not have any strength training. He enjoys eating salad some nights for dinner. Says he could stand to improve fiber in the diet.    Weight has dropped 4 lb in the last 3 months   He has not been checking home BP.   He mentions feeling excessively hot in the morning at least 3 or 4 times per week. Sleeps in underwear. This type of hot flash is not really associated with sweating, and has happened at least once outside of the acute waking period.   Medication list has been reviewed and updated.  Active Medications[1]   Review of Systems  Patient Active Problem List   Diagnosis Date Noted   Allergic conjunctivitis of both eyes 06/23/2023   Prediabetes 03/06/2023   Diverticulosis 10/05/2021   Eczema 03/20/2015   Knee pain, right 10/23/2013   Erectile dysfunction 03/27/2012   Serrated adenoma of rectum s/p TEM resection 27Jun2013 07/18/2011   Obstructive sleep apnea 02/16/2010   Benign neoplasm of skin 01/25/2010   Hypercholesterolemia 01/25/2010   Hypertension 01/25/2010   GERD 01/08/2009   Allergic rhinitis, unspecified 04/25/2007    Allergies[2]  Immunization History  Administered Date(s) Administered   Influenza Whole 11/18/2008   Influenza, Seasonal, Injecte, Preservative Fre 09/01/2012, 10/24/2023   Influenza,inj,Quad PF,6+ Mos 09/17/2014, 09/25/2015, 09/28/2016, 09/29/2017, 09/14/2020   Influenza-Unspecified 09/30/2013, 11/07/2019   Janssen (J&J) SARS-COV-2 Vaccination 03/17/2019   Moderna Sars-Covid-2 Vaccination 11/07/2019    Tdap 03/27/2012, 06/30/2017, 12/05/2022   Zoster Recombinant(Shingrix ) 09/29/2017, 12/05/2017    Past Surgical History:  Procedure Laterality Date   COLON SURGERY  2013   Benign cyst   COLONOSCOPY  01/04/2011   POLYPECTOMY     TRANSANAL ENDOSCOPIC MICROSURGERY  06/30/2011   WISDOM TOOTH EXTRACTION  01/03/2010    Social History[3]  Family History  Problem Relation Age of Onset   Stroke Mother    Arthritis Mother    Celiac disease Mother    Other Mother    Prostate cancer Father    Alzheimer's disease Father    Arthritis Other    Colon cancer Neg Hx    Colon polyps Neg Hx    Esophageal cancer Neg Hx    Rectal cancer Neg Hx    Stomach cancer Neg Hx         01/24/2024    9:41 AM 10/24/2023    8:03 AM 06/23/2023    8:05 AM 03/31/2023    8:42 AM  GAD 7 : Generalized Anxiety Score  Nervous, Anxious, on Edge 0 0  0  0   Control/stop worrying 0 0  0  0   Worry too much - different things 0  0  0   Trouble relaxing 0  0  0   Restless 0  0  0   Easily annoyed or irritable 0  0  0   Afraid - awful might happen 0  0  0   Total GAD 7 Score 0  0 0  Anxiety Difficulty Not  difficult at all  Not difficult at all      Data saved with a previous flowsheet row definition       01/24/2024    9:40 AM 10/24/2023    8:03 AM 06/23/2023    8:04 AM  Depression screen PHQ 2/9  Decreased Interest 0 0 0  Down, Depressed, Hopeless 0 0 0  PHQ - 2 Score 0 0 0  Altered sleeping   0  Tired, decreased energy   0  Change in appetite   0  Feeling bad or failure about yourself    0  Trouble concentrating   0  Moving slowly or fidgety/restless   0  Suicidal thoughts   0  PHQ-9 Score   0   Difficult doing work/chores   Not difficult at all     Data saved with a previous flowsheet row definition    BP Readings from Last 3 Encounters:  01/24/24 122/72  10/24/23 136/72  06/23/23 130/68    Wt Readings from Last 3 Encounters:  01/24/24 208 lb (94.3 kg)  10/24/23 212 lb (96.2 kg)   03/31/23 212 lb (96.2 kg)    BP 122/72   Pulse 71   Temp 98.4 F (36.9 C)   Ht 5' 8 (1.727 m)   Wt 208 lb (94.3 kg)   SpO2 97%   BMI 31.63 kg/m   Physical Exam Vitals and nursing note reviewed.  Constitutional:      Appearance: Normal appearance.  Cardiovascular:     Rate and Rhythm: Normal rate.  Pulmonary:     Effort: Pulmonary effort is normal.  Abdominal:     General: There is no distension.  Musculoskeletal:        General: Normal range of motion.  Skin:    General: Skin is warm and dry.  Neurological:     Mental Status: He is alert and oriented to person, place, and time.     Gait: Gait is intact.  Psychiatric:        Mood and Affect: Mood and affect normal.     Recent Labs     Component Value Date/Time   NA 142 06/23/2023 0847   K 4.8 06/23/2023 0847   CL 103 06/23/2023 0847   CO2 22 06/23/2023 0847   GLUCOSE 124 (H) 06/23/2023 0847   GLUCOSE 112 (H) 09/14/2020 0845   BUN 17 06/23/2023 0847   CREATININE 0.95 06/23/2023 0847   CREATININE 0.96 09/13/2019 0804   CALCIUM  9.4 06/23/2023 0847   PROT 6.7 03/02/2023 1023   ALBUMIN 4.5 03/02/2023 1023   AST 15 03/02/2023 1023   ALT 19 03/02/2023 1023   ALKPHOS 105 03/02/2023 1023   BILITOT 0.5 03/02/2023 1023   GFRNONAA 89 08/12/2016 1526   GFRNONAA >89 06/26/2013 0832   GFRAA 103 08/12/2016 1526   GFRAA >89 06/26/2013 0832    Lab Results  Component Value Date   WBC 6.7 03/02/2023   HGB 16.3 03/02/2023   HCT 49.9 03/02/2023   MCV 91 03/02/2023   PLT 207 03/02/2023   Lab Results  Component Value Date   HGBA1C 5.9 (H) 10/24/2023   HGBA1C 5.9 (H) 03/02/2023   HGBA1C 5.5 09/13/2019   Lab Results  Component Value Date   CHOL 170 10/24/2023   HDL 40 10/24/2023   LDLCALC 108 (H) 10/24/2023   TRIG 120 10/24/2023   CHOLHDL 4.3 10/24/2023   Lab Results  Component Value Date   TSH 2.260 03/02/2023  Assessment & Plan Hypercholesterolemia Repeat fasting labs today.  Continue  lifestyle changes as previously discussed, I do think patient would particularly benefit from introducing strength training into his regimen.  I recommend limiting consumption of foods high in saturated fats. High-fiber foods such as fruits, vegetables, beans, whole grains, and nuts are encouraged. As always, regular physical activity is recommended for cholesterol metabolism.  Orders:   Lipid panel     As far as the hot flashes are concerned, will follow clinically. Advised he sleep in comfortable shorts or pajama pants to minimize skin-skin contact which can produce a lot of heat under the covers in the winter months.   Follow-up 3 months OV HLD, HTN   Rolan Hoyle, PA-C, DMSc, DipACLM, Nutritionist [place] [facility] [phone]      [1]  Current Meds  Medication Sig   amLODipine  (NORVASC ) 5 MG tablet TAKE 1 TABLET(5 MG) BY MOUTH DAILY   cetirizine (ZYRTEC) 10 MG tablet Take 10 mg by mouth as needed.   olopatadine  (PATANOL) 0.1 % ophthalmic solution Place 1 drop into both eyes 2 (two) times daily.   valsartan  (DIOVAN ) 80 MG tablet TAKE 1 TABLET(80 MG) BY MOUTH DAILY   [DISCONTINUED] fluticasone  (FLONASE ) 50 MCG/ACT nasal spray Place into the nose.  [2] No Known Allergies [3]  Social History Tobacco Use   Smoking status: Never   Smokeless tobacco: Never  Vaping Use   Vaping status: Never Used  Substance Use Topics   Alcohol use: Yes    Alcohol/week: 1.0 standard drink of alcohol    Types: 1 Cans of beer per week    Comment: OCC- very OCC   Drug use: No   "

## 2024-01-24 NOTE — Assessment & Plan Note (Signed)
 Repeat fasting labs today.  Continue lifestyle changes as previously discussed, I do think patient would particularly benefit from introducing strength training into his regimen.  I recommend limiting consumption of foods high in saturated fats. High-fiber foods such as fruits, vegetables, beans, whole grains, and nuts are encouraged. As always, regular physical activity is recommended for cholesterol metabolism.  Orders:   Lipid panel

## 2024-01-25 ENCOUNTER — Other Ambulatory Visit: Payer: Self-pay | Admitting: Physician Assistant

## 2024-01-25 ENCOUNTER — Ambulatory Visit: Payer: Self-pay | Admitting: Physician Assistant

## 2024-01-25 LAB — LIPID PANEL
Chol/HDL Ratio: 5.8 ratio — ABNORMAL HIGH (ref 0.0–5.0)
Cholesterol, Total: 239 mg/dL — ABNORMAL HIGH (ref 100–199)
HDL: 41 mg/dL
LDL Chol Calc (NIH): 176 mg/dL — ABNORMAL HIGH (ref 0–99)
Triglycerides: 121 mg/dL (ref 0–149)
VLDL Cholesterol Cal: 22 mg/dL (ref 5–40)

## 2024-01-25 MED ORDER — ATORVASTATIN CALCIUM 10 MG PO TABS: 10.0000 mg | ORAL_TABLET | Freq: Every day | ORAL | 1 refills | Status: AC

## 2024-01-25 NOTE — Telephone Encounter (Signed)
 Please advise patient. JM

## 2024-04-26 ENCOUNTER — Ambulatory Visit: Admitting: Physician Assistant
